# Patient Record
Sex: Male | Born: 1955 | Race: White | Hispanic: No | Marital: Single | Smoking: Never smoker
Health system: Southern US, Community
[De-identification: ages and names within clinical notes are randomized; demographics above are authoritative.]

## PROBLEM LIST (undated history)

## (undated) DIAGNOSIS — E119 Type 2 diabetes mellitus without complications: Secondary | ICD-10-CM

## (undated) DIAGNOSIS — I219 Acute myocardial infarction, unspecified: Secondary | ICD-10-CM

## (undated) DIAGNOSIS — I25810 Atherosclerosis of coronary artery bypass graft(s) without angina pectoris: Secondary | ICD-10-CM

## (undated) DIAGNOSIS — E7800 Pure hypercholesterolemia, unspecified: Secondary | ICD-10-CM

## (undated) DIAGNOSIS — I10 Essential (primary) hypertension: Secondary | ICD-10-CM

## (undated) DIAGNOSIS — N400 Enlarged prostate without lower urinary tract symptoms: Secondary | ICD-10-CM

## (undated) DIAGNOSIS — I4891 Unspecified atrial fibrillation: Secondary | ICD-10-CM

## (undated) DIAGNOSIS — I2510 Atherosclerotic heart disease of native coronary artery without angina pectoris: Secondary | ICD-10-CM

## (undated) DIAGNOSIS — K219 Gastro-esophageal reflux disease without esophagitis: Secondary | ICD-10-CM

## (undated) HISTORY — PX: MEDIAL COLLATERAL LIGAMENT REPAIR, KNEE: SHX2019

## (undated) HISTORY — PX: LUMBAR FUSION: SHX111

## (undated) HISTORY — PX: OTHER SURGICAL HISTORY: SHX169

## (undated) HISTORY — PX: TIBIAL FASCIOTOMY: SHX2520

---

## 2017-12-19 ENCOUNTER — Encounter (HOSPITAL_COMMUNITY): Payer: Self-pay | Admitting: Emergency Medicine

## 2017-12-19 ENCOUNTER — Other Ambulatory Visit: Payer: Self-pay

## 2017-12-19 ENCOUNTER — Emergency Department (HOSPITAL_COMMUNITY)

## 2017-12-19 ENCOUNTER — Inpatient Hospital Stay (HOSPITAL_COMMUNITY)
Admission: EM | Admit: 2017-12-19 | Discharge: 2017-12-22 | DRG: 287 | Attending: Internal Medicine | Admitting: Internal Medicine

## 2017-12-19 DIAGNOSIS — I10 Essential (primary) hypertension: Secondary | ICD-10-CM | POA: Diagnosis present

## 2017-12-19 DIAGNOSIS — N400 Enlarged prostate without lower urinary tract symptoms: Secondary | ICD-10-CM | POA: Diagnosis not present

## 2017-12-19 DIAGNOSIS — I25710 Atherosclerosis of autologous vein coronary artery bypass graft(s) with unstable angina pectoris: Secondary | ICD-10-CM | POA: Diagnosis present

## 2017-12-19 DIAGNOSIS — Z951 Presence of aortocoronary bypass graft: Secondary | ICD-10-CM | POA: Diagnosis not present

## 2017-12-19 DIAGNOSIS — I4891 Unspecified atrial fibrillation: Secondary | ICD-10-CM | POA: Diagnosis present

## 2017-12-19 DIAGNOSIS — N189 Chronic kidney disease, unspecified: Secondary | ICD-10-CM | POA: Diagnosis present

## 2017-12-19 DIAGNOSIS — Z888 Allergy status to other drugs, medicaments and biological substances status: Secondary | ICD-10-CM | POA: Diagnosis not present

## 2017-12-19 DIAGNOSIS — I739 Peripheral vascular disease, unspecified: Principal | ICD-10-CM | POA: Diagnosis present

## 2017-12-19 DIAGNOSIS — I4581 Long QT syndrome: Secondary | ICD-10-CM | POA: Diagnosis present

## 2017-12-19 DIAGNOSIS — I25810 Atherosclerosis of coronary artery bypass graft(s) without angina pectoris: Secondary | ICD-10-CM | POA: Diagnosis present

## 2017-12-19 DIAGNOSIS — E669 Obesity, unspecified: Secondary | ICD-10-CM | POA: Diagnosis not present

## 2017-12-19 DIAGNOSIS — E1122 Type 2 diabetes mellitus with diabetic chronic kidney disease: Secondary | ICD-10-CM | POA: Diagnosis present

## 2017-12-19 DIAGNOSIS — Z7901 Long term (current) use of anticoagulants: Secondary | ICD-10-CM

## 2017-12-19 DIAGNOSIS — R079 Chest pain, unspecified: Secondary | ICD-10-CM | POA: Diagnosis not present

## 2017-12-19 DIAGNOSIS — Z7982 Long term (current) use of aspirin: Secondary | ICD-10-CM | POA: Diagnosis not present

## 2017-12-19 DIAGNOSIS — I25700 Atherosclerosis of coronary artery bypass graft(s), unspecified, with unstable angina pectoris: Secondary | ICD-10-CM | POA: Diagnosis not present

## 2017-12-19 DIAGNOSIS — Z955 Presence of coronary angioplasty implant and graft: Secondary | ICD-10-CM | POA: Diagnosis not present

## 2017-12-19 DIAGNOSIS — I252 Old myocardial infarction: Secondary | ICD-10-CM

## 2017-12-19 DIAGNOSIS — I25720 Atherosclerosis of autologous artery coronary artery bypass graft(s) with unstable angina pectoris: Secondary | ICD-10-CM | POA: Diagnosis present

## 2017-12-19 DIAGNOSIS — E1169 Type 2 diabetes mellitus with other specified complication: Secondary | ICD-10-CM | POA: Diagnosis not present

## 2017-12-19 DIAGNOSIS — E7800 Pure hypercholesterolemia, unspecified: Secondary | ICD-10-CM | POA: Diagnosis present

## 2017-12-19 DIAGNOSIS — E785 Hyperlipidemia, unspecified: Secondary | ICD-10-CM | POA: Diagnosis not present

## 2017-12-19 DIAGNOSIS — I129 Hypertensive chronic kidney disease with stage 1 through stage 4 chronic kidney disease, or unspecified chronic kidney disease: Secondary | ICD-10-CM | POA: Diagnosis not present

## 2017-12-19 DIAGNOSIS — I25110 Atherosclerotic heart disease of native coronary artery with unstable angina pectoris: Secondary | ICD-10-CM | POA: Diagnosis present

## 2017-12-19 DIAGNOSIS — I445 Left posterior fascicular block: Secondary | ICD-10-CM | POA: Diagnosis present

## 2017-12-19 DIAGNOSIS — I200 Unstable angina: Secondary | ICD-10-CM | POA: Diagnosis not present

## 2017-12-19 DIAGNOSIS — Z8249 Family history of ischemic heart disease and other diseases of the circulatory system: Secondary | ICD-10-CM | POA: Diagnosis not present

## 2017-12-19 DIAGNOSIS — Z6839 Body mass index (BMI) 39.0-39.9, adult: Secondary | ICD-10-CM

## 2017-12-19 DIAGNOSIS — Z794 Long term (current) use of insulin: Secondary | ICD-10-CM | POA: Diagnosis not present

## 2017-12-19 DIAGNOSIS — R202 Paresthesia of skin: Secondary | ICD-10-CM | POA: Diagnosis not present

## 2017-12-19 DIAGNOSIS — I480 Paroxysmal atrial fibrillation: Secondary | ICD-10-CM | POA: Diagnosis not present

## 2017-12-19 DIAGNOSIS — I959 Hypotension, unspecified: Secondary | ICD-10-CM | POA: Diagnosis not present

## 2017-12-19 DIAGNOSIS — N179 Acute kidney failure, unspecified: Secondary | ICD-10-CM | POA: Diagnosis not present

## 2017-12-19 DIAGNOSIS — Z79899 Other long term (current) drug therapy: Secondary | ICD-10-CM

## 2017-12-19 DIAGNOSIS — K219 Gastro-esophageal reflux disease without esophagitis: Secondary | ICD-10-CM | POA: Diagnosis present

## 2017-12-19 HISTORY — DX: Benign prostatic hyperplasia without lower urinary tract symptoms: N40.0

## 2017-12-19 HISTORY — DX: Acute myocardial infarction, unspecified: I21.9

## 2017-12-19 HISTORY — DX: Essential (primary) hypertension: I10

## 2017-12-19 HISTORY — DX: Atherosclerosis of coronary artery bypass graft(s) without angina pectoris: I25.810

## 2017-12-19 HISTORY — DX: Unspecified atrial fibrillation: I48.91

## 2017-12-19 HISTORY — DX: Type 2 diabetes mellitus without complications: E11.9

## 2017-12-19 HISTORY — DX: Gastro-esophageal reflux disease without esophagitis: K21.9

## 2017-12-19 HISTORY — DX: Atherosclerotic heart disease of native coronary artery without angina pectoris: I25.10

## 2017-12-19 HISTORY — DX: Pure hypercholesterolemia, unspecified: E78.00

## 2017-12-19 LAB — BASIC METABOLIC PANEL
Anion gap: 8 (ref 5–15)
BUN: 28 mg/dL — AB (ref 8–23)
CALCIUM: 8.3 mg/dL — AB (ref 8.9–10.3)
CO2: 19 mmol/L — AB (ref 22–32)
CREATININE: 1.41 mg/dL — AB (ref 0.61–1.24)
Chloride: 111 mmol/L (ref 98–111)
GFR calc Af Amer: 60 mL/min — ABNORMAL LOW (ref >60)
GFR calc non Af Amer: 52 mL/min — ABNORMAL LOW (ref >60)
Glucose, Bld: 184 mg/dL — ABNORMAL HIGH (ref 70–99)
Potassium: 4.3 mmol/L (ref 3.5–5.1)
SODIUM: 138 mmol/L (ref 135–145)

## 2017-12-19 LAB — CBC
HEMATOCRIT: 34.8 % — AB (ref 39.0–52.0)
HEMOGLOBIN: 11.8 g/dL — AB (ref 13.0–17.0)
MCH: 31.5 pg (ref 26.0–34.0)
MCHC: 33.9 g/dL (ref 30.0–36.0)
MCV: 92.8 fL (ref 78.0–100.0)
Platelets: 228 10*3/uL (ref 150–400)
RBC: 3.75 MIL/uL — ABNORMAL LOW (ref 4.22–5.81)
RDW: 13.4 % (ref 11.5–15.5)
WBC: 9.1 10*3/uL (ref 4.0–10.5)

## 2017-12-19 LAB — GLUCOSE, CAPILLARY
GLUCOSE-CAPILLARY: 162 mg/dL — AB (ref 70–99)
Glucose-Capillary: 171 mg/dL — ABNORMAL HIGH (ref 70–99)

## 2017-12-19 LAB — HEPARIN LEVEL (UNFRACTIONATED): Heparin Unfractionated: >2.2 IU/mL — ABNORMAL HIGH (ref 0.30–0.70)

## 2017-12-19 LAB — I-STAT TROPONIN, ED: TROPONIN I, POC: 0 ng/mL (ref 0.00–0.08)

## 2017-12-19 LAB — APTT
aPTT: 27 seconds (ref 24–36)
aPTT: 77 seconds — ABNORMAL HIGH (ref 24–36)

## 2017-12-19 LAB — TROPONIN I

## 2017-12-19 LAB — PROTIME-INR
INR: 1.51
Prothrombin Time: 18 seconds — ABNORMAL HIGH (ref 11.4–15.2)

## 2017-12-19 LAB — MRSA PCR SCREENING: MRSA BY PCR: NEGATIVE

## 2017-12-19 MED ORDER — RANOLAZINE ER 500 MG PO TB12
500.0000 mg | ORAL_TABLET | Freq: Two times a day (BID) | ORAL | Status: DC
  Administered 2017-12-19 – 2017-12-22 (×7): 500 mg via ORAL
  Filled 2017-12-19 (×7): qty 1

## 2017-12-19 MED ORDER — PANTOPRAZOLE SODIUM 40 MG PO TBEC
40.0000 mg | DELAYED_RELEASE_TABLET | Freq: Every day | ORAL | Status: DC
  Administered 2017-12-19 – 2017-12-21 (×3): 40 mg via ORAL
  Filled 2017-12-19 (×3): qty 1

## 2017-12-19 MED ORDER — FINASTERIDE 5 MG PO TABS
5.0000 mg | ORAL_TABLET | Freq: Every day | ORAL | Status: DC
  Administered 2017-12-19 – 2017-12-22 (×4): 5 mg via ORAL
  Filled 2017-12-19 (×4): qty 1

## 2017-12-19 MED ORDER — ATORVASTATIN CALCIUM 80 MG PO TABS
80.0000 mg | ORAL_TABLET | Freq: Every day | ORAL | Status: DC
  Administered 2017-12-19 – 2017-12-21 (×3): 80 mg via ORAL
  Filled 2017-12-19 (×3): qty 1

## 2017-12-19 MED ORDER — TAMSULOSIN HCL 0.4 MG PO CAPS
0.4000 mg | ORAL_CAPSULE | Freq: Every day | ORAL | Status: DC
  Administered 2017-12-19 – 2017-12-22 (×4): 0.4 mg via ORAL
  Filled 2017-12-19 (×4): qty 1

## 2017-12-19 MED ORDER — ASPIRIN EC 81 MG PO TBEC
81.0000 mg | DELAYED_RELEASE_TABLET | Freq: Every day | ORAL | Status: DC
  Administered 2017-12-20 – 2017-12-22 (×2): 81 mg via ORAL
  Filled 2017-12-19 (×3): qty 1

## 2017-12-19 MED ORDER — ACETAMINOPHEN 325 MG PO TABS: 650.0000 mg | ORAL_TABLET | ORAL | Status: DC | PRN

## 2017-12-19 MED ORDER — INSULIN ASPART 100 UNIT/ML ~~LOC~~ SOLN: 0.0000 [IU] | Freq: Every day | SUBCUTANEOUS | Status: DC

## 2017-12-19 MED ORDER — HEPARIN BOLUS VIA INFUSION
4000.0000 [IU] | Freq: Once | INTRAVENOUS | Status: AC
  Administered 2017-12-19: 4000 [IU] via INTRAVENOUS

## 2017-12-19 MED ORDER — DOCUSATE SODIUM 100 MG PO CAPS
100.0000 mg | ORAL_CAPSULE | Freq: Two times a day (BID) | ORAL | Status: DC
  Administered 2017-12-19 – 2017-12-22 (×5): 100 mg via ORAL
  Filled 2017-12-19 (×6): qty 1

## 2017-12-19 MED ORDER — ASPIRIN 81 MG PO CHEW
324.0000 mg | CHEWABLE_TABLET | Freq: Once | ORAL | Status: AC
  Administered 2017-12-19: 324 mg via ORAL
  Filled 2017-12-19: qty 4

## 2017-12-19 MED ORDER — MORPHINE SULFATE (PF) 4 MG/ML IV SOLN
4.0000 mg | Freq: Once | INTRAVENOUS | Status: AC
  Administered 2017-12-19: 4 mg via INTRAVENOUS
  Filled 2017-12-19: qty 1

## 2017-12-19 MED ORDER — NITROGLYCERIN IN D5W 200-5 MCG/ML-% IV SOLN
0.0000 ug/min | INTRAVENOUS | Status: DC
  Administered 2017-12-19: 5 ug/min via INTRAVENOUS
  Filled 2017-12-19 (×2): qty 250

## 2017-12-19 MED ORDER — INSULIN GLARGINE 100 UNIT/ML ~~LOC~~ SOLN
21.0000 [IU] | Freq: Every day | SUBCUTANEOUS | Status: DC
  Administered 2017-12-19 – 2017-12-21 (×3): 21 [IU] via SUBCUTANEOUS
  Filled 2017-12-19 (×4): qty 0.21

## 2017-12-19 MED ORDER — MORPHINE SULFATE (PF) 2 MG/ML IV SOLN
2.0000 mg | INTRAVENOUS | Status: DC | PRN
  Administered 2017-12-19: 2 mg via INTRAVENOUS
  Filled 2017-12-19: qty 1

## 2017-12-19 MED ORDER — HEPARIN (PORCINE) IN NACL 100-0.45 UNIT/ML-% IJ SOLN
1500.0000 [IU]/h | INTRAMUSCULAR | Status: DC
  Administered 2017-12-19 – 2017-12-20 (×2): 1200 [IU]/h via INTRAVENOUS
  Filled 2017-12-19 (×3): qty 250

## 2017-12-19 MED ORDER — INSULIN ASPART 100 UNIT/ML ~~LOC~~ SOLN
0.0000 [IU] | Freq: Three times a day (TID) | SUBCUTANEOUS | Status: DC
  Administered 2017-12-19 – 2017-12-20 (×2): 5 [IU] via SUBCUTANEOUS
  Administered 2017-12-20 (×2): 3 [IU] via SUBCUTANEOUS
  Administered 2017-12-21: 5 [IU] via SUBCUTANEOUS
  Administered 2017-12-22: 3 [IU] via SUBCUTANEOUS
  Administered 2017-12-22: 2 [IU] via SUBCUTANEOUS

## 2017-12-19 MED ORDER — SODIUM CHLORIDE 0.9 % IV SOLN
INTRAVENOUS | Status: DC
  Administered 2017-12-19 – 2017-12-22 (×4): via INTRAVENOUS

## 2017-12-19 MED ORDER — CARVEDILOL 3.125 MG PO TABS
3.1250 mg | ORAL_TABLET | Freq: Two times a day (BID) | ORAL | Status: DC
  Administered 2017-12-19 – 2017-12-21 (×4): 3.125 mg via ORAL
  Filled 2017-12-19 (×4): qty 1

## 2017-12-19 MED ORDER — ONDANSETRON HCL 4 MG/2ML IJ SOLN
4.0000 mg | Freq: Four times a day (QID) | INTRAMUSCULAR | Status: DC | PRN
  Administered 2017-12-19: 4 mg via INTRAVENOUS
  Filled 2017-12-19: qty 2

## 2017-12-19 NOTE — ED Triage Notes (Signed)
Pt reports having chest pain all week with response to nitro.  This morning around 0620 began having pain in center of chest radiating to left shoulder and left neck.  [facility] with no relief and another 2 by ems.  Also given Fentanyl  100mcg by ems with no change.

## 2017-12-19 NOTE — ED Notes (Signed)
[facility] present to transport patient. 

## 2017-12-19 NOTE — H&P (Signed)
History and Physical    Kevin Ballard ZOX:[id number] DOB: 17-Dec-1955 DOA: 12/19/2017  PCP: Patient, No Pcp Per  [facility] coming from: correctional facility  I have personally briefly reviewed patient's old medical records in [facility]  Chief Complaint: chest pain  HPI: Kevin Ballard is a 62 y.o. male with medical history significant of HTN, DM, CAD s/p CABG approximately 3 years ago with stent placement 2 years ago. Patient reports intermittent chest pain on exertion for the past week that resolves with nitroglycerin. This morning while he was walking, he had recurrence of chest pain, substernal that is described as a heaviness, radiating to left shoulder and jaw. He had tingling in left hand. Had shortness of breath, diaphoresis and nausea. He vomited once. He took 3 nitro without relief. He was transported to ED for evaluation   ED Course: EKG and troponin in ED were unrevealing. Blood pressure noted to be on the lower side. Chest xray did not show any acute findings. Due to his cardiac history and typical anginal symptoms, he was started on nitroglycerin and heparin infusions. He has been referred for admission.  Review of Systems: As per HPI otherwise 10 point review of systems negative.    Past Medical History:  Diagnosis Date  . Atrial fibrillation (HCC)   . Coronary artery disease   . Coronary artery disease involving coronary bypass graft   . Diabetes mellitus without complication (HCC)   . Hypertension   . Myocardial infarct [facility])     Past Surgical History:  Procedure Laterality Date  . MEDIAL COLLATERAL LIGAMENT REPAIR, KNEE    . Kimber Relic       reports that he has never smoked. He does not have any smokeless tobacco history on file. He reports that he does not drink alcohol or use drugs.  Allergies  Allergen Reactions  . Brilinta [Ticagrelor]     Family history: Mother has a history of coronary disease  Prior to Admission medications   Medication Sig Start  Date End Date Taking? Authorizing Provider  AMLODIPINE BESYLATE PO Take 10 mg by mouth daily.   Yes [provider]  apixaban (ELIQUIS) 5 MG TABS tablet Take 5 mg by mouth 2 (two) times daily.   Yes [provider]  aspirin EC 81 MG tablet Take 81 mg by mouth daily.   Yes [provider]  atorvastatin (LIPITOR) 80 MG tablet Take 80 mg by mouth 2 (two) times daily.   Yes [provider]  carvedilol (COREG) 25 MG tablet Take 25 mg by mouth 2 (two) times daily with a meal.   Yes [provider]  docusate sodium (COLACE) 100 MG capsule Take 100 mg by mouth 2 (two) times daily.   Yes [provider]  enalapril (VASOTEC) 10 MG tablet Take 10 mg by mouth 2 (two) times daily.   Yes [provider]  finasteride (PROSCAR) 5 MG tablet Take 5 mg by mouth daily.   Yes [provider]  insulin glargine (LANTUS) 100 UNIT/ML injection Inject 21 Units into the skin at bedtime.   Yes [provider]  isosorbide mononitrate (IMDUR) 60 MG 24 hr tablet Take 60 mg by mouth daily.   Yes [provider]  metFORMIN (GLUCOPHAGE) 1000 MG tablet Take 1,000 mg by mouth daily.   Yes [provider]  nitroGLYCERIN (NITROSTAT) 0.3 MG SL tablet Place 0.3 mg under the tongue every 5 (five) minutes as needed for chest pain.   Yes [provider]  omeprazole (PRILOSEC) 20 MG capsule Take 20 mg by mouth daily.   Yes [provider]  ranolazine (RANEXA) 500 MG 12 hr tablet Take 500 mg by mouth 2 (two) times daily.   Yes [provider]  tamsulosin (FLOMAX) 0.4 MG CAPS capsule Take 0.4 mg by mouth daily.   Yes [provider]    Physical Exam: Vitals:   12/19/17 0738 12/19/17 0739 12/19/17 0740 12/19/17 1100  BP:  (!) 101/45 (!) 104/50 (!) 110/58  Pulse: 69 68 70 69  Resp: 11 (!) 21 18 20   Temp:   (!) 97.5 F (36.4 C)   TempSrc:   Oral   SpO2: 99% 99% 98% 98%  Weight:      Height:         Constitutional: NAD, calm, comfortable Vitals:   12/19/17 0738 12/19/17 0739 12/19/17 0740 12/19/17 1100  BP:  (!) 101/45 (!) 104/50 (!) 110/58  Pulse: 69 68 70 69  Resp: 11 (!) 21 18 20   Temp:   (!) 97.5 F (36.4 C)   TempSrc:   Oral   SpO2: 99% 99% 98% 98%  Weight:      Height:       Eyes: PERRL, lids and conjunctivae normal ENMT: Mucous membranes are moist. Posterior pharynx clear of any exudate or lesions.Normal dentition.  Neck: normal, supple, no masses, no thyromegaly Respiratory: clear to auscultation bilaterally, no wheezing, no crackles. Normal respiratory effort. No accessory muscle use.  Cardiovascular: Regular rate and rhythm, no murmurs / rubs / gallops. 1+ extremity edema. 2+ pedal pulses. No carotid bruits.  Abdomen: no tenderness, no masses palpated. No hepatosplenomegaly. Bowel sounds positive.  Musculoskeletal: no clubbing / cyanosis. No joint deformity upper and lower extremities. Good ROM, no contractures. Normal muscle tone.  Skin: no rashes, lesions, ulcers. No induration Neurologic: CN 2-12 grossly intact. Sensation intact, DTR normal. Strength 5/5 in all 4.  Psychiatric: Normal judgment and insight. Alert and oriented x 3. Normal mood.    Labs on Admission: I have personally reviewed following labs and imaging studies  CBC: Recent Labs  Lab 12/19/17 0825  WBC 9.1  HGB 11.8*  HCT 34.8*  MCV 92.8  PLT 228   Basic Metabolic Panel: Recent Labs  Lab 12/19/17 0825  NA 138  K 4.3  CL 111  CO2 19*  GLUCOSE 184*  BUN 28*  CREATININE 1.41*  CALCIUM 8.3*   GFR: Estimated Creatinine Clearance: 68.2 mL/min (A) (by C-G formula based on SCr of 1.41 mg/dL (H)). Liver Function Tests: No results for input(s): AST, ALT, ALKPHOS, BILITOT, PROT, ALBUMIN in the last 168 hours. No results for input(s): LIPASE, AMYLASE in the last 168 hours. No results for input(s): AMMONIA in the last 168 hours. Coagulation Profile: Recent Labs  Lab 12/19/17 0825   INR 1.51   Cardiac Enzymes: No results for input(s): CKTOTAL, CKMB, CKMBINDEX, TROPONINI in the last 168 hours. BNP (last 3 results) No results for input(s): PROBNP in the last 8760 hours. HbA1C: No results for input(s): HGBA1C in the last 72 hours. CBG: No results for input(s): GLUCAP in the last 168 hours. Lipid Profile: No results for input(s): CHOL, HDL, LDLCALC, TRIG, CHOLHDL, LDLDIRECT in the last 72 hours. Thyroid Function Tests: No results for input(s): TSH, T4TOTAL, FREET4, T3FREE, THYROIDAB in the last 72 hours. Anemia Panel: No results for input(s): VITAMINB12, FOLATE, FERRITIN, TIBC, IRON, RETICCTPCT in the last 72 hours. Urine analysis: No results found for: COLORURINE, APPEARANCEUR, LABSPEC, PHURINE, GLUCOSEU, HGBUR, BILIRUBINUR,  Vance GatherKETONESUR, PROTEINUR, UROBILINOGEN, NITRITE, LEUKOCYTESUR  Radiological Exams on Admission: Dg Chest Portable 1 View  Result Date: 12/19/2017 CLINICAL DATA:  Chest pain for 1 week. EXAM: PORTABLE CHEST 1 VIEW COMPARISON:  None. FINDINGS: The patient is status post CABG. Heart size is upper normal. The lungs are clear. No pneumothorax or pleural effusion. No acute bony abnormality. IMPRESSION: No acute disease. Electronically Signed   By: Kevin Kannerhomas  Dalessio M.D.   On: 12/19/2017 08:28    EKG: Independently reviewed. Sinus rhythm without acute changes  Assessment/Plan Principal Problem:   Chest pain Active Problems:   CAD (coronary artery disease) of bypass graft   Type 2 diabetes mellitus with other specified complication (HCC)   Unspecified atrial fibrillation (HCC)   Chronic anticoagulation   HTN (hypertension)   HLD (hyperlipidemia)   BPH (benign prostatic hyperplasia)     1. Chest pain. Symptoms are typical for angina. EKG and troponin are unrevealing. He is already on aspirin. Blood pressures are soft, so will hold BB, norvasc and ACE. Continue ranexa. He has been started on nitroglycerin infusion as well as heparin infusion. He is  already on statin. Continue to cycle troponins. Discuss[facility] with Kevin Ball[facility] 2. CAD s/p CABG approximately 3 years ago. [place] stent was placed 2 years ago at same facility. No records in care everywhere. Continue on aspirin 3. Paroxysmal A fib. Currently in sinus rhythm. He is on coreg 25mg  bid. Holding BB due to soft blood pressures. Chronically on eliquis, but this has been held in place of IV heparin 4. Diabetes. Continue on lantus. Will supplement with sliding scale. Hold metformin 5. HLD. Continue statin 6. BPH. Continue finasteride and flomax  DVT prophylaxis: heparin infusion  Code Status: full code  Family Communication: no family present  Disposition Plan: [facility]  Consults called: cardiology, Kevin Ballard  Admission status: observation, stepdown   Kevin BlinksJehanzeb Bryler Dibble MD [facility] Pager [phone]  If 7PM-7AM, please contact night-coverage [email or web address] Password [email or web address]  12/19/2017, 11:23 AM

## 2017-12-19 NOTE — Progress Notes (Signed)
ANTICOAGULATION CONSULT NOTE  Pharmacy Consult for heparin Indication: ACS/STEMI  Allergies  Allergen Reactions  . Brilinta [Ticagrelor]     Patient Measurements: Height: 5\' 9"  (175.3 cm) Weight: 264 lb 14.4 oz (120.2 kg) IBW/kg (Calculated) : 70.7 Heparin Dosing Weight: 97.5  Vital Signs: Temp: 98 F (36.7 C) (07/06 1954) Temp Source: Oral (07/06 1954) BP: 108/55 (07/06 1954) Pulse Rate: 68 (07/06 1954)  Labs: Recent Labs    12/19/17 0825 12/19/17 1129 12/19/17 1442 12/19/17 1850  HGB 11.8*  --   --   --   HCT 34.8*  --   --   --   PLT 228  --   --   --   APTT  --  27  --  77*  LABPROT 18.0*  --   --   --   INR 1.51  --   --   --   HEPARINUNFRC  --  >2.20*  --  >2.20*  CREATININE 1.41*  --   --   --   TROPONINI  --   --  <0.03  --     Estimated Creatinine Clearance: 68.6 mL/min (A) (by C-G formula based on SCr of 1.41 mg/dL (H)).   Medical History: Past Medical History:  Diagnosis Date  . Atrial fibrillation (HCC)   . BPH (benign prostatic hyperplasia)   . Coronary artery disease   . Coronary artery disease involving coronary bypass graft   . Diabetes mellitus without complication (HCC)   . GERD (gastroesophageal reflux disease)   . Hypercholesterolemia   . Hypertension   . Myocardial infarct [facility](HCC)     Medications:  Medications Prior to Admission  Medication Sig Dispense Refill Last Dose  . AMLODIPINE BESYLATE PO Take 10 mg by mouth daily.   12/18/2017 at Unknown time  . apixaban (ELIQUIS) 5 MG TABS tablet Take 5 mg by mouth 2 (two) times daily.   12/18/2017 at Unknown time  . aspirin EC 81 MG tablet Take 81 mg by mouth daily.   12/18/2017 at Unknown time  . atorvastatin (LIPITOR) 80 MG tablet Take 80 mg by mouth daily at 6 PM.    12/18/2017 at Unknown time  . carvedilol (COREG) 25 MG tablet Take 25 mg by mouth 2 (two) times daily with a meal.   12/18/2017 at Unknown time  . docusate sodium (COLACE) 100 MG capsule Take 100 mg by mouth 2 (two) times daily.    12/18/2017 at Unknown time  . enalapril (VASOTEC) 10 MG tablet Take 10 mg by mouth 2 (two) times daily.   12/18/2017 at Unknown time  . finasteride (PROSCAR) 5 MG tablet Take 5 mg by mouth daily.   12/18/2017 at Unknown time  . insulin glargine (LANTUS) 100 UNIT/ML injection Inject 21 Units into the skin at bedtime.   12/18/2017 at Unknown time  . isosorbide mononitrate (IMDUR) 60 MG 24 hr tablet Take 60 mg by mouth daily.   12/18/2017 at Unknown time  . metFORMIN (GLUCOPHAGE) 1000 MG tablet Take 1,000 mg by mouth daily.   12/18/2017 at Unknown time  . nitroGLYCERIN (NITROSTAT) 0.3 MG SL tablet Place 0.3 mg under the tongue every 5 (five) minutes as needed for chest pain.   unknown  . omeprazole (PRILOSEC) 20 MG capsule Take 20 mg by mouth daily.   12/18/2017 at Unknown time  . ranolazine (RANEXA) 500 MG 12 hr tablet Take 500 mg by mouth 2 (two) times daily.   12/18/2017 at Unknown time  . tamsulosin (FLOMAX) 0.4  MG CAPS capsule Take 0.4 mg by mouth daily.   12/18/2017 at Unknown time    Assessment: Pharmacy consulted to dose heparin on patient with unstable angina. Patient on apixaban prior to admission with last dose being given 7/5. Plans noted for cath on Monday -aPTT = 77, heparin level > 2.2 (influence of recent xarelto)  Goal of Therapy:  Target aPTT 66-102 s Heparin level 0.3-0.7 units/ml Monitor platelets by anticoagulation protocol: Yes   Plan:   -No heparin changes needed -Daily heparin level and CBC  Harland German, PharmD Clinical Pharmacist Please check Amion for pharmacy contact number

## 2017-12-19 NOTE — ED Notes (Signed)
Report given to [facility]. 

## 2017-12-19 NOTE — Plan of Care (Signed)
  Problem: Nutrition: Goal: Adequate nutrition will be maintained Outcome: Progressing   Problem: Education: Goal: Knowledge of General Education information will improve Outcome: Completed/Met

## 2017-12-19 NOTE — ED Provider Notes (Signed)
[facility] EMERGENCY DEPARTMENT Provider Note   CSN: [id number] Arrival date & time: 12/19/17  [id number]     History   Chief Complaint Chief Complaint  Patient presents with  . Chest Pain    HPI Kevin Ballard is a 62 y.o. male.  HPI Patient presents to the emergency room for evaluation of chest pain.  Patient has a history of coronary artery disease as well as myocardial infarctions.  States he has been having trouble with chest pain over the past week.  He was taking nitroglycerin which relieved the symptoms after few minutes.  This morning he started having chest pain again that started about 6 AM.  He tried taking his nitroglycerin without relief.  He is having a severe pressure in his chest that radiates to his arm and shoulder.  He has nausea and shortness of breath and felt diaphoretic.  The symptoms feel similar to his prior anginal symptoms.  Patient is currently incarcerated so he has not been able to see his cardiologist.  He was brought to the emergency room for evaluation this morning. Past Medical History:  Diagnosis Date  . Atrial fibrillation (HCC)   . Coronary artery disease   . Coronary artery disease involving coronary bypass graft   . Diabetes mellitus without complication (HCC)   . Hypertension   . Myocardial infarct (HCC)     There are no active problems to display for this patient.   Past Surgical History:  Procedure Laterality Date  . MEDIAL COLLATERAL LIGAMENT REPAIR, KNEE    . tibiastomy          Home Medications    Prior to Admission medications   Not on File    Family History History reviewed. No pertinent family history.  Social History Social History   Tobacco Use  . Smoking status: Never Smoker  Substance Use Topics  . Alcohol use: Never    Frequency: Never  . Drug use: Never     Allergies   Brilinta [ticagrelor]   Review of Systems Review of Systems  All other systems reviewed and are negative.    Physical  Exam Updated Vital Signs BP (!) 104/50 (BP Location: Left Arm)   Pulse 70   Temp (!) 97.5 F (36.4 C) (Oral)   Resp 18   Ht 1.753 m (5\' 9" )   Wt 118.8 kg (262 lb)   SpO2 98%   BMI 38.69 kg/m   Physical Exam  Constitutional: He appears well-developed and well-nourished. No distress.  HENT:  Head: Normocephalic and atraumatic.  Right Ear: External ear normal.  Left Ear: External ear normal.  Eyes: Conjunctivae are normal. Right eye exhibits no discharge. Left eye exhibits no discharge. No scleral icterus.  Neck: Neck supple. No tracheal deviation present.  Cardiovascular: Normal rate, regular rhythm and intact distal pulses.  Pulmonary/Chest: Effort normal and breath sounds normal. No stridor. No respiratory distress. He has no wheezes. He has no rales.  Abdominal: Soft. Bowel sounds are normal. He exhibits no distension. There is no tenderness. There is no rebound and no guarding.  Musculoskeletal: He exhibits no edema or tenderness.  Neurological: He is alert. He has normal strength. No cranial nerve deficit (no facial droop, extraocular movements intact, no slurred speech) or sensory deficit. He exhibits normal muscle tone. He displays no seizure activity. Coordination normal.  Skin: Skin is warm and dry. No rash noted.  Psychiatric: He has a normal mood and affect.  Nursing note and vitals reviewed.  ED Treatments / Results  Labs (all labs ordered are listed, but only abnormal results are displayed) Labs Reviewed  BASIC METABOLIC PANEL - Abnormal; Notable for the following components:      Result Value   CO2 19 (*)    Glucose, Bld 184 (*)    BUN 28 (*)    Creatinine, Ser 1.41 (*)    Calcium 8.3 (*)    GFR calc non Af Amer 52 (*)    GFR calc Af Amer 60 (*)    All other components within normal limits  CBC - Abnormal; Notable for the following components:   RBC 3.75 (*)    Hemoglobin 11.8 (*)    HCT 34.8 (*)    All other components within normal limits   PROTIME-INR - Abnormal; Notable for the following components:   Prothrombin Time 18.0 (*)    All other components within normal limits  I-STAT TROPONIN, ED    EKG EKG Interpretation  Date/Time:  Saturday December 19 2017 07:33:45 EDT Ventricular Rate:  71 PR Interval:    QRS Duration: 93 QT Interval:  445 QTC Calculation: 484 R Axis:   133 Text Interpretation:  Sinus rhythm Borderline prolonged PR interval Left posterior fascicular block Borderline prolonged QT interval No old tracing to compare Confirmed by Linwood Dibbles [phone]) on 12/19/2017 7:47:21 AM   Radiology Dg Chest Portable 1 View  Result Date: 12/19/2017 CLINICAL DATA:  Chest pain for 1 week. EXAM: PORTABLE CHEST 1 VIEW COMPARISON:  None. FINDINGS: The patient is status post CABG. Heart size is upper normal. The lungs are clear. No pneumothorax or pleural effusion. No acute bony abnormality. IMPRESSION: No acute disease. Electronically Signed   By: Drusilla Kanner M.D.   On: 12/19/2017 08:28    Procedures .Critical Care Performed by: Linwood Dibbles, MD Authorized by: Linwood Dibbles, MD   Critical care provider statement:    Critical care time (minutes):  35   Critical care was time spent personally by me on the following activities:  Discussions with consultants, evaluation of patient's response to treatment, examination of patient, ordering and performing treatments and interventions, ordering and review of laboratory studies, ordering and review of radiographic studies, pulse oximetry, re-evaluation of patient's condition, obtaining history from patient or surrogate and review of old charts   (including critical care time)  Medications Ordered in ED Medications  0.9 %  sodium chloride infusion ( Intravenous New Bag/Given 12/19/17 0901)  nitroGLYCERIN 50 mg in dextrose 5 % 250 mL (0.2 mg/mL) infusion (15 mcg/min Intravenous Rate/Dose Change 12/19/17 1015)  aspirin chewable tablet 324 mg (324 mg Oral Given 12/19/17 0831)  morphine 4  MG/ML injection 4 mg (4 mg Intravenous Given 12/19/17 0831)     Initial Impression / Assessment and Plan / ED Course  I have reviewed the triage vital signs and the nursing notes.  Pertinent labs & imaging results that were available during my care of the patient were reviewed by me and considered in my medical decision making (see chart for details).  Clinical Course as of Dec 20 1023  Sat Dec 19, 2017  0955 First trop =  0.   [JK]  1001 CXR normal.  Initial labs notable for chronic renal insufficiency.  No old for comparison.     [JK]  1009 Chest pain is better although he feels like it is coming back again.  Will increase ntg.  Plan on admission.   [JK]  1020 No old records available in care everywhere.     [  JK]  1024 D/w Dr Kerry Hough.  Will plan on admission to [facility], stepdown.  Hep infusion ordered by me.   Will repeat EKG   [JK]    Clinical Course User Index [JK] Linwood Dibbles, MD    Patient presented to the emergency room for evaluation of chest pain.  Patient has a history of coronary artery disease.  Patient's presenting with symptoms that he feels is similar to his prior anginal attacks.  Patient was treated with nitroglycerin as well as morphine with improvement of his symptoms will not completely resolved.  No acute ischemic changes noted on EKG.  His initial troponin is normal.  Patient is certainly at high risk for recurrent unstable angina and acute coronary syndrome.  I will consult the medical service for admission so he can continue to monitor his pain and check his troponins.  Final Clinical Impressions(s) / ED Diagnoses   Final diagnoses:  Chest pain, unspecified type       Linwood Dibbles, MD 12/19/17 1025

## 2017-12-19 NOTE — Progress Notes (Signed)
ANTICOAGULATION CONSULT NOTE - Initial Consult  Pharmacy Consult for heparin Indication: ACS/STEMI  Allergies  Allergen Reactions  . Brilinta [Ticagrelor]     Patient Measurements: Height: 5\' 9"  (175.3 cm) Weight: 262 lb (118.8 kg) IBW/kg (Calculated) : 70.7 Heparin Dosing Weight: 97.5  Vital Signs: Temp: 97.5 F (36.4 C) (07/06 0740) Temp Source: Oral (07/06 0740) BP: 104/50 (07/06 0740) Pulse Rate: 70 (07/06 0740)  Labs: Recent Labs    12/19/17 0825  HGB 11.8*  HCT 34.8*  PLT 228  LABPROT 18.0*  INR 1.51  CREATININE 1.41*    Estimated Creatinine Clearance: 68.2 mL/min (A) (by C-G formula based on SCr of 1.41 mg/dL (H)).   Medical History: Past Medical History:  Diagnosis Date  . Atrial fibrillation (HCC)   . Coronary artery disease   . Coronary artery disease involving coronary bypass graft   . Diabetes mellitus without complication (HCC)   . Hypertension   . Myocardial infarct [facility](HCC)     Medications:   (Not in a hospital admission)  Assessment: Pharmacy consulted to dose heparin on patient with ACS/STEMI. Patient on apixaban prior to admission with last dose being given 7/5. Will order aPTT and heparin level prior to administration of heparin. Will titrate heparin based on aPTT until apixaban effects of diminished.  Goal of Therapy:  Target aPTT 66-102 s Heparin level 0.3-0.7 units/ml Monitor platelets by anticoagulation protocol: Yes   Plan:  Give 4000 units bolus x 1 Start heparin infusion at 1200 units/hr Check anti-Xa level in 6 hours and daily while on heparin Continue to monitor H&H and platelets  Salvatore DecentSteven C Niurka Benecke 12/19/2017,10:37 AM

## 2017-12-19 NOTE — Consult Note (Signed)
Cardiology Consultation:   Patient ID: Kevin Ballard; [id number]; 1955/09/05   Admit date: 12/19/2017 Date of Consult: 12/19/2017  Primary Care Provider: Patient, No Pcp Per Primary Cardiologist: No primary care provider on file. New Primary Electrophysiologist:  none   Patient Profile:   Kevin Ballard is a 62 y.o. male with a hx of CAD s/p CABG and multiple prior stents  who is being seen today for the evaluation of increasing chest pain at the request of Dr. Kerry Hough.  History of Present Illness:   Kevin Ballard is currently in correctional facility. He has a history of IDDM, HTN, HLD. He has a long history of CAD with initial stents of LAD and diagonal in 2001. Subsequent stenting of the distal RCA in 2003. In 2016 he presented with an MI and underwent CABG x 3 at [facility] in [place]. 8 months later he had early graft failure and underwent stenting of SVG to diagonal with a 2.5 x 15 mm stent. He reports that he had chest pain last December and underwent cardiac cath in [place]. He was told his grafts were patent but there was some lesion that wasn't felt to be significant.  Over the past week he has noted increasing symptoms of chest pain with exertion. Typically relieved with sl Ntg x 1. Today he had recurrent chest pain 8/10 much worse than before and presented to ED. Pain did not resolve with sl Ntg. Did improve with IV Ntg and morphine. Still having 2/10 chest pain. Pain is mid sternal radiating to left arm. Associated with weakness, N/V x 1 and SOB. Similar to prior cardiac pain but worse. He does have a history of AFib about a year ago x 1 and is on Eliquis. Last dose last night.   Past Medical History:  Diagnosis Date  . Atrial fibrillation (HCC)   . BPH (benign prostatic hyperplasia)   . Coronary artery disease   . Coronary artery disease involving coronary bypass graft   . Diabetes mellitus without complication (HCC)   . GERD (gastroesophageal reflux disease)   .  Hypercholesterolemia   . Hypertension   . Myocardial infarct [facility])     Past Surgical History:  Procedure Laterality Date  . LUMBAR FUSION    . MEDIAL COLLATERAL LIGAMENT REPAIR, KNEE    . TIBIAL FASCIOTOMY    . tibiastomy       Home Medications:  Prior to Admission medications   Medication Sig Start Date End Date Taking? Authorizing Provider  AMLODIPINE BESYLATE PO Take 10 mg by mouth daily.   Yes [provider]  apixaban (ELIQUIS) 5 MG TABS tablet Take 5 mg by mouth 2 (two) times daily.   Yes [provider]  aspirin EC 81 MG tablet Take 81 mg by mouth daily.   Yes [provider]  atorvastatin (LIPITOR) 80 MG tablet Take 80 mg by mouth daily at 6 PM.    Yes [provider]  carvedilol (COREG) 25 MG tablet Take 25 mg by mouth 2 (two) times daily with a meal.   Yes [provider]  docusate sodium (COLACE) 100 MG capsule Take 100 mg by mouth 2 (two) times daily.   Yes [provider]  enalapril (VASOTEC) 10 MG tablet Take 10 mg by mouth 2 (two) times daily.   Yes [provider]  finasteride (PROSCAR) 5 MG tablet Take 5 mg by mouth daily.   Yes [provider]  insulin glargine (LANTUS) 100 UNIT/ML injection Inject  21 Units into the skin at bedtime.   Yes [provider]  isosorbide mononitrate (IMDUR) 60 MG 24 hr tablet Take 60 mg by mouth daily.   Yes [provider]  metFORMIN (GLUCOPHAGE) 1000 MG tablet Take 1,000 mg by mouth daily.   Yes [provider]  nitroGLYCERIN (NITROSTAT) 0.3 MG SL tablet Place 0.3 mg under the tongue every 5 (five) minutes as needed for chest pain.   Yes [provider]  omeprazole (PRILOSEC) 20 MG capsule Take 20 mg by mouth daily.   Yes [provider]  ranolazine (RANEXA) 500 MG 12 hr tablet Take 500 mg by mouth 2 (two) times daily.   Yes [provider]  tamsulosin (FLOMAX) 0.4 MG CAPS capsule Take 0.4 mg by mouth daily.   Yes  [provider]    Inpatient Medications: Scheduled Meds: . aspirin EC  81 mg Oral Daily  . atorvastatin  80 mg Oral q1800  . docusate sodium  100 mg Oral BID  . finasteride  5 mg Oral Daily  . insulin aspart  0-15 Units Subcutaneous TID WC  . insulin aspart  0-5 Units Subcutaneous QHS  . insulin glargine  21 Units Subcutaneous QHS  . pantoprazole  40 mg Oral Daily  . ranolazine  500 mg Oral BID  . tamsulosin  0.4 mg Oral Daily   Continuous Infusions: . sodium chloride 125 mL/hr at 12/19/17 1524  . heparin 1,200 Units/hr (12/19/17 1400)  . nitroGLYCERIN 15 mcg/min (12/19/17 1400)   PRN Meds: acetaminophen, morphine injection, ondansetron (ZOFRAN) IV  Allergies:    Allergies  Allergen Reactions  . Brilinta [Ticagrelor]     Social History:   Social History   Socioeconomic History  . Marital status: Single    Spouse name: Not on file  . Number of children: Not on file  . Years of education: Not on file  . Highest education level: Not on file  Occupational History  . Not on file  Social Needs  . Financial resource strain: Not on file  . Food insecurity:    Worry: Not on file    Inability: Not on file  . Transportation needs:    Medical: Not on file    Non-medical: Not on file  Tobacco Use  . Smoking status: Never Smoker  . Smokeless tobacco: Never Used  Substance and Sexual Activity  . Alcohol use: Never    Frequency: Never  . Drug use: Never  . Sexual activity: Not on file  Lifestyle  . Physical activity:    Days per week: Not on file    Minutes per session: Not on file  . Stress: Not on file  Relationships  . Social connections:    Talks on phone: Not on file    Gets together: Not on file    Attends religious service: Not on file    Active member of club or organization: Not on file    Attends meetings of clubs or organizations: Not on file    Relationship status: Not on file  . Intimate partner violence:    Fear of current or ex partner:  Not on file    Emotionally abused: Not on file    Physically abused: Not on file    Forced sexual activity: Not on file  Other Topics Concern  . Not on file  Social History Narrative  . Not on file    Family History:    Family History  Problem Relation Age of  Onset  . Heart attack Mother   . Cancer Mother   . Heart attack Maternal Grandmother      ROS:  Please see the history of present illness.   All other ROS reviewed and negative.     Physical Exam/Data:   Vitals:   12/19/17 1245 12/19/17 1300 12/19/17 1415 12/19/17 1616  BP: (!) 105/48 (!) 100/47 (!) 108/57   Pulse: 69 66 64   Resp: 16 16    Temp:   97.8 F (36.6 C)   TempSrc:   Oral   SpO2: 95% 95% 99% (P) 95%  Weight:   264 lb 14.4 oz (120.2 kg)   Height:   5\' 9"  (1.753 m)     Intake/Output Summary (Last 24 hours) at 12/19/2017 1617 Last data filed at 12/19/2017 1400 Gross per 24 hour  Intake 662.3 ml  Output -  Net 662.3 ml   Filed Weights   12/19/17 0735 12/19/17 1415  Weight: 262 lb (118.8 kg) 264 lb 14.4 oz (120.2 kg)   Body mass index is 39.12 kg/m.  General:  Well nourished, obese, in no acute distress HEENT: normal Lymph: no adenopathy Neck: no JVD Endocrine:  No thryomegaly Vascular: No carotid bruits; FA pulses 2+ bilaterally without bruits  Cardiac:  normal S1, S2; RRR; no murmur  Lungs:  clear to auscultation bilaterally, no wheezing, rhonchi or rales  Abd: soft, nontender, no hepatomegaly  Ext: no edema Musculoskeletal:  No deformities, BUE and BLE strength normal and equal Skin: warm and dry  Neuro:  CNs 2-12 intact, no focal abnormalities noted Psych:  Normal affect   EKG:  The EKG was personally reviewed and demonstrates:  NSR rate 71. Normal Ecg.  Telemetry:  Telemetry was personally reviewed and demonstrates:  NSR  Relevant CV Studies: none  Laboratory Data:  Chemistry Recent Labs  Lab 12/19/17 0825  NA 138  K 4.3  CL 111  CO2 19*  GLUCOSE 184*  BUN 28*  CREATININE  1.41*  CALCIUM 8.3*  GFRNONAA 52*  GFRAA 60*  ANIONGAP 8    No results for input(s): PROT, ALBUMIN, AST, ALT, ALKPHOS, BILITOT in the last 168 hours. Hematology Recent Labs  Lab 12/19/17 0825  WBC 9.1  RBC 3.75*  HGB 11.8*  HCT 34.8*  MCV 92.8  MCH 31.5  MCHC 33.9  RDW 13.4  PLT 228   Cardiac Enzymes Recent Labs  Lab 12/19/17 1442  TROPONINI <0.03    Recent Labs  Lab 12/19/17 0837  TROPIPOC 0.00    BNPNo results for input(s): BNP, PROBNP in the last 168 hours.  DDimer No results for input(s): DDIMER in the last 168 hours.  Radiology/Studies:  Dg Chest Portable 1 View  Result Date: 12/19/2017 CLINICAL DATA:  Chest pain for 1 week. EXAM: PORTABLE CHEST 1 VIEW COMPARISON:  None. FINDINGS: The patient is status post CABG. Heart size is upper normal. The lungs are clear. No pneumothorax or pleural effusion. No acute bony abnormality. IMPRESSION: No acute disease. Electronically Signed   By: Drusilla Kannerhomas  Dalessio M.D.   On: 12/19/2017 08:28    Assessment and Plan:   1. Unstable angina. Progressive symptoms with exertion and now at rest. Symptoms are classic. Known CAD with prior CABG and multiple prior PCIs. Will request records from [place]. Agree with IV heparin and Ntg. Cycle cardiac enzymes. Will continue beta blocker if BP allows. Recommend invasive evaluation Monday. Will need hydration prior to cath. Intolerant to Brilinta in the past so if he  needs intervention will need to use Plavix especially since he is also on anticoagulation 2. CAD s/p CABG x 3. Multiple stents 3. IDDM 4. HLD on statin 5. HTN controlled. 6. ? CKD creatinine 1.4.  7. History of paroxysmal atrial fibrillation. Eliquis on hold for potential cardiac cath.    For questions or updates, please contact [facility] Please consult [email or web address] for contact info under Cardiology/STEMI.   Signed, Toniette Devera [place], MD  12/19/2017 4:17 PM

## 2017-12-20 ENCOUNTER — Other Ambulatory Visit: Payer: Self-pay

## 2017-12-20 DIAGNOSIS — I739 Peripheral vascular disease, unspecified: Secondary | ICD-10-CM | POA: Diagnosis present

## 2017-12-20 DIAGNOSIS — N400 Enlarged prostate without lower urinary tract symptoms: Secondary | ICD-10-CM | POA: Diagnosis present

## 2017-12-20 DIAGNOSIS — Z888 Allergy status to other drugs, medicaments and biological substances status: Secondary | ICD-10-CM | POA: Diagnosis not present

## 2017-12-20 DIAGNOSIS — I4581 Long QT syndrome: Secondary | ICD-10-CM | POA: Diagnosis present

## 2017-12-20 DIAGNOSIS — Z951 Presence of aortocoronary bypass graft: Secondary | ICD-10-CM | POA: Diagnosis not present

## 2017-12-20 DIAGNOSIS — Z7901 Long term (current) use of anticoagulants: Secondary | ICD-10-CM

## 2017-12-20 DIAGNOSIS — I252 Old myocardial infarction: Secondary | ICD-10-CM | POA: Diagnosis not present

## 2017-12-20 DIAGNOSIS — I25700 Atherosclerosis of coronary artery bypass graft(s), unspecified, with unstable angina pectoris: Secondary | ICD-10-CM | POA: Diagnosis not present

## 2017-12-20 DIAGNOSIS — Z8249 Family history of ischemic heart disease and other diseases of the circulatory system: Secondary | ICD-10-CM | POA: Diagnosis not present

## 2017-12-20 DIAGNOSIS — Z6839 Body mass index (BMI) 39.0-39.9, adult: Secondary | ICD-10-CM | POA: Diagnosis not present

## 2017-12-20 DIAGNOSIS — Z794 Long term (current) use of insulin: Secondary | ICD-10-CM | POA: Diagnosis not present

## 2017-12-20 DIAGNOSIS — I480 Paroxysmal atrial fibrillation: Secondary | ICD-10-CM | POA: Diagnosis present

## 2017-12-20 DIAGNOSIS — E1169 Type 2 diabetes mellitus with other specified complication: Secondary | ICD-10-CM | POA: Diagnosis present

## 2017-12-20 DIAGNOSIS — I259 Chronic ischemic heart disease, unspecified: Secondary | ICD-10-CM | POA: Diagnosis not present

## 2017-12-20 DIAGNOSIS — I25710 Atherosclerosis of autologous vein coronary artery bypass graft(s) with unstable angina pectoris: Secondary | ICD-10-CM | POA: Diagnosis present

## 2017-12-20 DIAGNOSIS — I445 Left posterior fascicular block: Secondary | ICD-10-CM | POA: Diagnosis present

## 2017-12-20 DIAGNOSIS — E7800 Pure hypercholesterolemia, unspecified: Secondary | ICD-10-CM | POA: Diagnosis not present

## 2017-12-20 DIAGNOSIS — I200 Unstable angina: Secondary | ICD-10-CM | POA: Diagnosis not present

## 2017-12-20 DIAGNOSIS — Z7982 Long term (current) use of aspirin: Secondary | ICD-10-CM | POA: Diagnosis not present

## 2017-12-20 DIAGNOSIS — I10 Essential (primary) hypertension: Secondary | ICD-10-CM | POA: Diagnosis not present

## 2017-12-20 DIAGNOSIS — I25720 Atherosclerosis of autologous artery coronary artery bypass graft(s) with unstable angina pectoris: Secondary | ICD-10-CM | POA: Diagnosis present

## 2017-12-20 DIAGNOSIS — I25110 Atherosclerotic heart disease of native coronary artery with unstable angina pectoris: Secondary | ICD-10-CM | POA: Diagnosis present

## 2017-12-20 DIAGNOSIS — N189 Chronic kidney disease, unspecified: Secondary | ICD-10-CM | POA: Diagnosis present

## 2017-12-20 DIAGNOSIS — E669 Obesity, unspecified: Secondary | ICD-10-CM | POA: Diagnosis present

## 2017-12-20 DIAGNOSIS — R202 Paresthesia of skin: Secondary | ICD-10-CM | POA: Diagnosis present

## 2017-12-20 DIAGNOSIS — R079 Chest pain, unspecified: Secondary | ICD-10-CM | POA: Diagnosis present

## 2017-12-20 DIAGNOSIS — N179 Acute kidney failure, unspecified: Secondary | ICD-10-CM | POA: Diagnosis present

## 2017-12-20 DIAGNOSIS — E785 Hyperlipidemia, unspecified: Secondary | ICD-10-CM | POA: Diagnosis present

## 2017-12-20 DIAGNOSIS — Z955 Presence of coronary angioplasty implant and graft: Secondary | ICD-10-CM | POA: Diagnosis not present

## 2017-12-20 DIAGNOSIS — E1122 Type 2 diabetes mellitus with diabetic chronic kidney disease: Secondary | ICD-10-CM | POA: Diagnosis present

## 2017-12-20 DIAGNOSIS — I129 Hypertensive chronic kidney disease with stage 1 through stage 4 chronic kidney disease, or unspecified chronic kidney disease: Secondary | ICD-10-CM | POA: Diagnosis present

## 2017-12-20 LAB — CBC
HCT: 39.6 % (ref 39.0–52.0)
HEMOGLOBIN: 13.1 g/dL (ref 13.0–17.0)
MCH: 31.3 pg (ref 26.0–34.0)
MCHC: 33.1 g/dL (ref 30.0–36.0)
MCV: 94.5 fL (ref 78.0–100.0)
Platelets: 269 10*3/uL (ref 150–400)
RBC: 4.19 MIL/uL — AB (ref 4.22–5.81)
RDW: 12.9 % (ref 11.5–15.5)
WBC: 9.7 10*3/uL (ref 4.0–10.5)

## 2017-12-20 LAB — BASIC METABOLIC PANEL
Anion gap: 10 (ref 5–15)
BUN: 17 mg/dL (ref 8–23)
CHLORIDE: 111 mmol/L (ref 98–111)
CO2: 17 mmol/L — ABNORMAL LOW (ref 22–32)
Calcium: 8.6 mg/dL — ABNORMAL LOW (ref 8.9–10.3)
Creatinine, Ser: 1.25 mg/dL — ABNORMAL HIGH (ref 0.61–1.24)
GFR calc Af Amer: >60 mL/min (ref >60)
GFR calc non Af Amer: 60 mL/min — ABNORMAL LOW (ref >60)
Glucose, Bld: 185 mg/dL — ABNORMAL HIGH (ref 70–99)
POTASSIUM: 4.2 mmol/L (ref 3.5–5.1)
SODIUM: 138 mmol/L (ref 135–145)

## 2017-12-20 LAB — TROPONIN I: Troponin I: <0.03 ng/mL (ref <0.03)

## 2017-12-20 LAB — GLUCOSE, CAPILLARY
GLUCOSE-CAPILLARY: 208 mg/dL — AB (ref 70–99)
Glucose-Capillary: 165 mg/dL — ABNORMAL HIGH (ref 70–99)
Glucose-Capillary: 183 mg/dL — ABNORMAL HIGH (ref 70–99)
Glucose-Capillary: 171 mg/dL — ABNORMAL HIGH (ref 70–99)

## 2017-12-20 LAB — HIV ANTIBODY (ROUTINE TESTING W REFLEX): HIV SCREEN 4TH GENERATION: NONREACTIVE

## 2017-12-20 LAB — HEPARIN LEVEL (UNFRACTIONATED): HEPARIN UNFRACTIONATED: 1.78 [IU]/mL — AB (ref 0.30–0.70)

## 2017-12-20 LAB — APTT: aPTT: 65 seconds — ABNORMAL HIGH (ref 24–36)

## 2017-12-20 MED ORDER — SODIUM CHLORIDE 0.9% FLUSH: 3.0000 mL | Freq: Two times a day (BID) | INTRAVENOUS | Status: DC

## 2017-12-20 MED ORDER — SODIUM CHLORIDE 0.9 % WEIGHT BASED INFUSION
1.0000 mL/kg/h | INTRAVENOUS | Status: DC
  Administered 2017-12-21: 1 mL/kg/h via INTRAVENOUS

## 2017-12-20 MED ORDER — SODIUM CHLORIDE 0.9 % WEIGHT BASED INFUSION: 3.0000 mL/kg/h | INTRAVENOUS | Status: AC

## 2017-12-20 MED ORDER — SODIUM CHLORIDE 0.9% FLUSH: 3.0000 mL | INTRAVENOUS | Status: DC | PRN

## 2017-12-20 MED ORDER — SODIUM CHLORIDE 0.9 % IV SOLN: 250.0000 mL | INTRAVENOUS | Status: DC | PRN

## 2017-12-20 NOTE — Progress Notes (Signed)
ANTICOAGULATION CONSULT NOTE  Pharmacy Consult for heparin Indication: ACS/STEMI  Allergies  Allergen Reactions  . Brilinta [Ticagrelor]     Patient Measurements: Height: 5\' 9"  (175.3 cm) Weight: 265 lb 14.4 oz (120.6 kg) IBW/kg (Calculated) : 70.7 Heparin Dosing Weight: 97.5  Vital Signs: Temp: 98.2 F (36.8 C) (07/07 1147) Temp Source: Oral (07/07 1147) BP: 116/57 (07/07 1147) Pulse Rate: 76 (07/07 1147)  Labs: Recent Labs    12/19/17 0825 12/19/17 1129 12/19/17 1442 12/19/17 1850 12/19/17 2145 12/20/17 0533 12/20/17 0737  HGB 11.8*  --   --   --   --  13.1  --   HCT 34.8*  --   --   --   --  39.6  --   PLT 228  --   --   --   --  269  --   APTT  --  27  --  77*  --   --  65*  LABPROT 18.0*  --   --   --   --   --   --   INR 1.51  --   --   --   --   --   --   HEPARINUNFRC  --  >2.20*  --  >2.20*  --   --  1.78*  CREATININE 1.41*  --   --   --   --  1.25*  --   TROPONINI  --   --  <0.03  --  <0.03 <0.03  --     Estimated Creatinine Clearance: 77.6 mL/min (A) (by C-G formula based on SCr of 1.25 mg/dL (H)).   Medical History: Past Medical History:  Diagnosis Date  . Atrial fibrillation (HCC)   . BPH (benign prostatic hyperplasia)   . Coronary artery disease   . Coronary artery disease involving coronary bypass graft   . Diabetes mellitus without complication (HCC)   . GERD (gastroesophageal reflux disease)   . Hypercholesterolemia   . Hypertension   . Myocardial infarct [facility](HCC)     Medications:  Medications Prior to Admission  Medication Sig Dispense Refill Last Dose  . AMLODIPINE BESYLATE PO Take 10 mg by mouth daily.   12/18/2017 at Unknown time  . apixaban (ELIQUIS) 5 MG TABS tablet Take 5 mg by mouth 2 (two) times daily.   12/18/2017 at Unknown time  . aspirin EC 81 MG tablet Take 81 mg by mouth daily.   12/18/2017 at Unknown time  . atorvastatin (LIPITOR) 80 MG tablet Take 80 mg by mouth daily at 6 PM.    12/18/2017 at Unknown time  . carvedilol (COREG)  25 MG tablet Take 25 mg by mouth 2 (two) times daily with a meal.   12/18/2017 at Unknown time  . docusate sodium (COLACE) 100 MG capsule Take 100 mg by mouth 2 (two) times daily.   12/18/2017 at Unknown time  . enalapril (VASOTEC) 10 MG tablet Take 10 mg by mouth 2 (two) times daily.   12/18/2017 at Unknown time  . finasteride (PROSCAR) 5 MG tablet Take 5 mg by mouth daily.   12/18/2017 at Unknown time  . insulin glargine (LANTUS) 100 UNIT/ML injection Inject 21 Units into the skin at bedtime.   12/18/2017 at Unknown time  . isosorbide mononitrate (IMDUR) 60 MG 24 hr tablet Take 60 mg by mouth daily.   12/18/2017 at Unknown time  . metFORMIN (GLUCOPHAGE) 1000 MG tablet Take 1,000 mg by mouth daily.   12/18/2017 at Unknown time  . nitroGLYCERIN (  NITROSTAT) 0.3 MG SL tablet Place 0.3 mg under the tongue every 5 (five) minutes as needed for chest pain.   unknown  . omeprazole (PRILOSEC) 20 MG capsule Take 20 mg by mouth daily.   12/18/2017 at Unknown time  . ranolazine (RANEXA) 500 MG 12 hr tablet Take 500 mg by mouth 2 (two) times daily.   12/18/2017 at Unknown time  . tamsulosin (FLOMAX) 0.4 MG CAPS capsule Take 0.4 mg by mouth daily.   12/18/2017 at Unknown time    Assessment: Pharmacy consulted to dose heparin on patient with unstable angina. Patient on apixaban prior to admission with last dose being given 7/5. Plans noted for cath on Monday -aPTT = 65 and below goal, heparin level = 1.78 (influence of recent xarelto)  Goal of Therapy:  Target aPTT 66-102 s Heparin level 0.3-0.7 units/ml Monitor platelets by anticoagulation protocol: Yes   Plan:   -Increase heparin to 1300 units/hr -Daily heparin level and CBC  Harland German, PharmD Clinical Pharmacist Please check Amion for pharmacy contact number

## 2017-12-20 NOTE — Plan of Care (Signed)
  Problem: Pain Managment: Goal: General experience of comfort will improve Outcome: Progressing   Problem: Coping: Goal: Level of anxiety will decrease Outcome: Completed/Met

## 2017-12-20 NOTE — Progress Notes (Signed)
Progress Note  Patient Name: Kevin Ballard Date of Encounter: 12/20/2017  Primary Cardiologist: No primary care provider on file.   Subjective   Feels well. Just a dull ache in chest now.  Inpatient Medications    Scheduled Meds: . aspirin EC  81 mg Oral Daily  . atorvastatin  80 mg Oral q1800  . carvedilol  3.125 mg Oral BID WC  . docusate sodium  100 mg Oral BID  . finasteride  5 mg Oral Daily  . insulin aspart  0-15 Units Subcutaneous TID WC  . insulin aspart  0-5 Units Subcutaneous QHS  . insulin glargine  21 Units Subcutaneous QHS  . pantoprazole  40 mg Oral Daily  . ranolazine  500 mg Oral BID  . sodium chloride flush  3 mL Intravenous Q12H  . tamsulosin  0.4 mg Oral Daily   Continuous Infusions: . sodium chloride 125 mL/hr at 12/20/17 0324  . sodium chloride    . sodium chloride    . heparin 1,200 Units/hr (12/20/17 0323)  . nitroGLYCERIN 20 mcg/min (12/19/17 1500)   PRN Meds: sodium chloride, acetaminophen, morphine injection, ondansetron (ZOFRAN) IV, sodium chloride flush   Vital Signs    Vitals:   12/19/17 1954 12/20/17 0030 12/20/17 0546 12/20/17 0736  BP: (!) 108/55 (!) 110/58 (!) 110/55 (!) 117/53  Pulse: 68 79 81 70  Resp: 16 17 19 18   Temp: 98 F (36.7 C) 98 F (36.7 C) 98.4 F (36.9 C) 97.8 F (36.6 C)  TempSrc: Oral Oral Oral Oral  SpO2: 95% 96% 97% 96%  Weight:   265 lb 14.4 oz (120.6 kg)   Height:        Intake/Output Summary (Last 24 hours) at 12/20/2017 1042 Last data filed at 12/20/2017 0900 Gross per 24 hour  Intake 3942.65 ml  Output 1850 ml  Net 2092.65 ml   Filed Weights   12/19/17 0735 12/19/17 1415 12/20/17 0546  Weight: 262 lb (118.8 kg) 264 lb 14.4 oz (120.2 kg) 265 lb 14.4 oz (120.6 kg)    Telemetry    NSR - Personally Reviewed  ECG    NSR. No acute ST- T wave changes - Personally Reviewed  Physical Exam   GEN: No acute distress. obese   Neck: No JVD Cardiac: RRR, no murmurs, rubs, or gallops.  Respiratory:  Clear to auscultation bilaterally. GI: Soft, nontender, non-distended  MS: No edema; No deformity. Neuro:  Nonfocal  Psych: Normal affect   Labs    Chemistry Recent Labs  Lab 12/19/17 0825 12/20/17 0533  NA 138 138  K 4.3 4.2  CL 111 111  CO2 19* 17*  GLUCOSE 184* 185*  BUN 28* 17  CREATININE 1.41* 1.25*  CALCIUM 8.3* 8.6*  GFRNONAA 52* 60*  GFRAA 60* >60  ANIONGAP 8 10     Hematology Recent Labs  Lab 12/19/17 0825 12/20/17 0533  WBC 9.1 9.7  RBC 3.75* 4.19*  HGB 11.8* 13.1  HCT 34.8* 39.6  MCV 92.8 94.5  MCH 31.5 31.3  MCHC 33.9 33.1  RDW 13.4 12.9  PLT 228 269    Cardiac Enzymes Recent Labs  Lab 12/19/17 1442 12/19/17 2145 12/20/17 0533  TROPONINI <0.03 <0.03 <0.03    Recent Labs  Lab 12/19/17 0837  TROPIPOC 0.00     BNPNo results for input(s): BNP, PROBNP in the last 168 hours.   DDimer No results for input(s): DDIMER in the last 168 hours.   Radiology    Dg Chest Portable 1  View  Result Date: 12/19/2017 CLINICAL DATA:  Chest pain for 1 week. EXAM: PORTABLE CHEST 1 VIEW COMPARISON:  None. FINDINGS: The patient is status post CABG. Heart size is upper normal. The lungs are clear. No pneumothorax or pleural effusion. No acute bony abnormality. IMPRESSION: No acute disease. Electronically Signed   By: Drusilla Kannerhomas  Dalessio M.D.   On: 12/19/2017 08:28    Cardiac Studies   none  Patient Profile     62 y.o. male with a hx of CAD s/p CABG and multiple prior stents  who is being seen today for the evaluation of increasing chest pain    Assessment & Plan    1. Unstable angina. Progressive symptoms with exertion and now at rest. Symptoms are fairly classic. Known CAD with prior CABG and multiple prior PCIs. Will request records from [place]- still pending. Continue IV heparin and Ntg. Troponin levels all normal and no acute Ecg changes. Will continue lower dose beta blocker if BP allows. Recommend invasive evaluation Monday. Will need hydration prior  to cath. Intolerant to Brilinta in the past so if he needs intervention will need to use Plavix especially since he is also on anticoagulation 2. CAD s/p CABG x 3. Multiple stents 3. IDDM 4. HLD on statin 5. HTN controlled. 6. ? CKD creatinine improved 1.41- 1.25.  7. History of paroxysmal atrial fibrillation. Eliquis on hold for potential cardiac cath.         For questions or updates, please contact [facility] Please consult [email or web address] for contact info under Cardiology/STEMI.      Signed, Talis Iwan [place], MD  12/20/2017, 10:42 AM

## 2017-12-20 NOTE — H&P (View-Only) (Signed)
 Progress Note  Patient Name: Kevin Ballard Date of Encounter: 12/20/2017  Primary Cardiologist: No primary care provider on file.   Subjective   Feels well. Just a dull ache in chest now.  Inpatient Medications    Scheduled Meds: . aspirin EC  81 mg Oral Daily  . atorvastatin  80 mg Oral q1800  . carvedilol  3.125 mg Oral BID WC  . docusate sodium  100 mg Oral BID  . finasteride  5 mg Oral Daily  . insulin aspart  0-15 Units Subcutaneous TID WC  . insulin aspart  0-5 Units Subcutaneous QHS  . insulin glargine  21 Units Subcutaneous QHS  . pantoprazole  40 mg Oral Daily  . ranolazine  500 mg Oral BID  . sodium chloride flush  3 mL Intravenous Q12H  . tamsulosin  0.4 mg Oral Daily   Continuous Infusions: . sodium chloride 125 mL/hr at 12/20/17 0324  . sodium chloride    . sodium chloride    . heparin 1,200 Units/hr (12/20/17 0323)  . nitroGLYCERIN 20 mcg/min (12/19/17 1500)   PRN Meds: sodium chloride, acetaminophen, morphine injection, ondansetron (ZOFRAN) IV, sodium chloride flush   Vital Signs    Vitals:   12/19/17 1954 12/20/17 0030 12/20/17 0546 12/20/17 0736  BP: (!) 108/55 (!) 110/58 (!) 110/55 (!) 117/53  Pulse: 68 79 81 70  Resp: 16 17 19 18  Temp: 98 F (36.7 C) 98 F (36.7 C) 98.4 F (36.9 C) 97.8 F (36.6 C)  TempSrc: Oral Oral Oral Oral  SpO2: 95% 96% 97% 96%  Weight:   265 lb 14.4 oz (120.6 kg)   Height:        Intake/Output Summary (Last 24 hours) at 12/20/2017 1042 Last data filed at 12/20/2017 0900 Gross per 24 hour  Intake 3942.65 ml  Output 1850 ml  Net 2092.65 ml   Filed Weights   12/19/17 0735 12/19/17 1415 12/20/17 0546  Weight: 262 lb (118.8 kg) 264 lb 14.4 oz (120.2 kg) 265 lb 14.4 oz (120.6 kg)    Telemetry    NSR - Personally Reviewed  ECG    NSR. No acute ST- T wave changes - Personally Reviewed  Physical Exam   GEN: No acute distress. obese   Neck: No JVD Cardiac: RRR, no murmurs, rubs, or gallops.  Respiratory:  Clear to auscultation bilaterally. GI: Soft, nontender, non-distended  MS: No edema; No deformity. Neuro:  Nonfocal  Psych: Normal affect   Labs    Chemistry Recent Labs  Lab 12/19/17 0825 12/20/17 0533  NA 138 138  K 4.3 4.2  CL 111 111  CO2 19* 17*  GLUCOSE 184* 185*  BUN 28* 17  CREATININE 1.41* 1.25*  CALCIUM 8.3* 8.6*  GFRNONAA 52* 60*  GFRAA 60* >60  ANIONGAP 8 10     Hematology Recent Labs  Lab 12/19/17 0825 12/20/17 0533  WBC 9.1 9.7  RBC 3.75* 4.19*  HGB 11.8* 13.1  HCT 34.8* 39.6  MCV 92.8 94.5  MCH 31.5 31.3  MCHC 33.9 33.1  RDW 13.4 12.9  PLT 228 269    Cardiac Enzymes Recent Labs  Lab 12/19/17 1442 12/19/17 2145 12/20/17 0533  TROPONINI <0.03 <0.03 <0.03    Recent Labs  Lab 12/19/17 0837  TROPIPOC 0.00     BNPNo results for input(s): BNP, PROBNP in the last 168 hours.   DDimer No results for input(s): DDIMER in the last 168 hours.   Radiology    Dg Chest Portable 1   View  Result Date: 12/19/2017 CLINICAL DATA:  Chest pain for 1 week. EXAM: PORTABLE CHEST 1 VIEW COMPARISON:  None. FINDINGS: The patient is status post CABG. Heart size is upper normal. The lungs are clear. No pneumothorax or pleural effusion. No acute bony abnormality. IMPRESSION: No acute disease. Electronically Signed   By: Thomas  Dalessio M.D.   On: 12/19/2017 08:28    Cardiac Studies   none  Patient Profile     62 y.o. male with a hx of CAD s/p CABG and multiple prior stents  who is being seen today for the evaluation of increasing chest pain    Assessment & Plan    1. Unstable angina. Progressive symptoms with exertion and now at rest. Symptoms are fairly classic. Known CAD with prior CABG and multiple prior PCIs. Will request records from [place]- still pending. Continue IV heparin and Ntg. Troponin levels all normal and no acute Ecg changes. Will continue lower dose beta blocker if BP allows. Recommend invasive evaluation Monday. Will need hydration prior  to cath. Intolerant to Brilinta in the past so if he needs intervention will need to use Plavix especially since he is also on anticoagulation 2. CAD s/p CABG x 3. Multiple stents 3. IDDM 4. HLD on statin 5. HTN controlled. 6. ? CKD creatinine improved 1.41- 1.25.  7. History of paroxysmal atrial fibrillation. Eliquis on hold for potential cardiac cath.         For questions or updates, please contact [facility] Please consult [email or web address] for contact info under Cardiology/STEMI.      Signed, Ulys Favia, MD  12/20/2017, 10:42 AM    

## 2017-12-20 NOTE — Progress Notes (Signed)
[facility] TEAM 1 - Stepdown/ICU Vianne BullsEAM  Jordyn XxxDenny  ZOX:[id number] DOB: 27-Jun-1955 DOA: 12/19/2017 PCP: Patient, No Pcp Per    Brief Narrative:  62 y.o. M w/ a hx of HTN, DM, CAD s/p CABG approximately 3 years ago with stent placement 2 years ago who presented w/ classic anginal type chest pain on exertion for a week that resolves with nitroglycerin.   Significant Events: 7/6 admit at [facility] - [facility]   Subjective: Resting comfortably in bed. Some aching pain in chest but no pressure.  Denies sob, n/v, or abdom pain.    Assessment & Plan:  Angina - hx of CAD s/p CABG and subsequent stenting  Cont medical tx w/ plan for cardiac cath in AM  Acute kidney injury v/s CKD Hydrate and follow - crt is improving - no baseline crt for reference   Recent Labs  Lab 12/19/17 0825 12/20/17 0533  CREATININE 1.41* 1.25*    Parox Afib  Cont BB - holding eliquis for impending cath > IV heparin   DM CBG reasonably controlled - follow w/o change for today   HLD Lipitor at max dose already   BPH  DVT prophylaxis: heparin gtt  Code Status: FULL CODE Family Communication: no family present at time of exam  Disposition Plan: awaiting cardiac cath in AM  Consultants:  Cardiology   Antimicrobials:  None   Objective: Blood pressure (!) 117/53, pulse 70, temperature 97.8 F (36.6 C), temperature source Oral, resp. rate 18, height 5\' 9"  (1.753 m), weight 120.6 kg (265 lb 14.4 oz), SpO2 96 %.  Intake/Output Summary (Last 24 hours) at 12/20/2017 1112 Last data filed at 12/20/2017 0900 Gross per 24 hour  Intake 3942.65 ml  Output 1850 ml  Net 2092.65 ml   Filed Weights   12/19/17 0735 12/19/17 1415 12/20/17 0546  Weight: 118.8 kg (262 lb) 120.2 kg (264 lb 14.4 oz) 120.6 kg (265 lb 14.4 oz)    Examination: General: No acute respiratory distress Lungs: Clear to auscultation bilaterally without wheezes or crackles Cardiovascular: Regular rate and rhythm without murmur gallop or  rub normal S1 and S2 Abdomen: Nontender, nondistended, soft, bowel sounds positive, no rebound, no ascites, no appreciable mass Extremities: No significant cyanosis, clubbing, or edema bilateral lower extremities  CBC: Recent Labs  Lab 12/19/17 0825 12/20/17 0533  WBC 9.1 9.7  HGB 11.8* 13.1  HCT 34.8* 39.6  MCV 92.8 94.5  PLT 228 269   Basic Metabolic Panel: Recent Labs  Lab 12/19/17 0825 12/20/17 0533  NA 138 138  K 4.3 4.2  CL 111 111  CO2 19* 17*  GLUCOSE 184* 185*  BUN 28* 17  CREATININE 1.41* 1.25*  CALCIUM 8.3* 8.6*   GFR: Estimated Creatinine Clearance: 77.6 mL/min (A) (by C-G formula based on SCr of 1.25 mg/dL (H)).  Liver Function Tests: No results for input(s): AST, ALT, ALKPHOS, BILITOT, PROT, ALBUMIN in the last 168 hours. No results for input(s): LIPASE, AMYLASE in the last 168 hours. No results for input(s): AMMONIA in the last 168 hours.  Coagulation Profile: Recent Labs  Lab 12/19/17 0825  INR 1.51    Cardiac Enzymes: Recent Labs  Lab 12/19/17 1442 12/19/17 2145 12/20/17 0533  TROPONINI <0.03 <0.03 <0.03    HbA1C: No results found for: HGBA1C  CBG: Recent Labs  Lab 12/19/17 1629 12/19/17 2100 12/20/17 0732  GLUCAP 162* 171* 165*    Recent Results (from the past 240 hour(s))  MRSA PCR Screening  Status: None   Collection Time: 12/19/17  2:35 PM  Result Value Ref Range Status   MRSA by PCR NEGATIVE NEGATIVE Final    Comment:        The GeneXpert MRSA Assay (FDA approved for NASAL specimens only), is one component of a comprehensive MRSA colonization surveillance program. It is not intended to diagnose MRSA infection nor to guide or monitor treatment for MRSA infections. Performed at [facility], [address], [place], [place] [address]      Scheduled Meds: . aspirin EC  81 mg Oral Daily  . atorvastatin  80 mg Oral q1800  . carvedilol  3.125 mg Oral BID WC  . docusate sodium  100 mg Oral BID  .  finasteride  5 mg Oral Daily  . insulin aspart  0-15 Units Subcutaneous TID WC  . insulin aspart  0-5 Units Subcutaneous QHS  . insulin glargine  21 Units Subcutaneous QHS  . pantoprazole  40 mg Oral Daily  . ranolazine  500 mg Oral BID  . sodium chloride flush  3 mL Intravenous Q12H  . tamsulosin  0.4 mg Oral Daily   Continuous Infusions: . sodium chloride 125 mL/hr at 12/20/17 0324  . sodium chloride    . sodium chloride    . heparin 1,200 Units/hr (12/20/17 0323)  . nitroGLYCERIN 20 mcg/min (12/19/17 1500)     LOS: 0 days   Lonia Blood, MD [facility] Office  [phone] Pager - Text Page per Amion as per below:  On-Call/Text Page:      [email or web address]      password [email or web address]  If 7PM-7AM, please contact night-coverage [email or web address] Password [email or web address] 12/20/2017, 11:12 AM

## 2017-12-21 ENCOUNTER — Other Ambulatory Visit: Payer: Self-pay

## 2017-12-21 ENCOUNTER — Encounter (HOSPITAL_COMMUNITY): Payer: Self-pay | Admitting: Cardiology

## 2017-12-21 ENCOUNTER — Inpatient Hospital Stay (HOSPITAL_COMMUNITY): Admission: EM | Payer: Self-pay | Attending: Internal Medicine

## 2017-12-21 DIAGNOSIS — I25700 Atherosclerosis of coronary artery bypass graft(s), unspecified, with unstable angina pectoris: Secondary | ICD-10-CM

## 2017-12-21 DIAGNOSIS — I200 Unstable angina: Secondary | ICD-10-CM

## 2017-12-21 DIAGNOSIS — E7800 Pure hypercholesterolemia, unspecified: Secondary | ICD-10-CM

## 2017-12-21 DIAGNOSIS — I480 Paroxysmal atrial fibrillation: Secondary | ICD-10-CM

## 2017-12-21 DIAGNOSIS — I10 Essential (primary) hypertension: Secondary | ICD-10-CM

## 2017-12-21 HISTORY — PX: LEFT HEART CATH AND CORS/GRAFTS ANGIOGRAPHY: CATH118250

## 2017-12-21 LAB — CBC
HCT: 34 % — ABNORMAL LOW (ref 39.0–52.0)
Hemoglobin: 11.3 g/dL — ABNORMAL LOW (ref 13.0–17.0)
MCH: 31.1 pg (ref 26.0–34.0)
MCHC: 33.2 g/dL (ref 30.0–36.0)
MCV: 93.7 fL (ref 78.0–100.0)
Platelets: 202 10*3/uL (ref 150–400)
RBC: 3.63 MIL/uL — ABNORMAL LOW (ref 4.22–5.81)
RDW: 12.9 % (ref 11.5–15.5)
WBC: 7.7 10*3/uL (ref 4.0–10.5)

## 2017-12-21 LAB — HEPARIN LEVEL (UNFRACTIONATED): Heparin Unfractionated: 0.8 IU/mL — ABNORMAL HIGH (ref 0.30–0.70)

## 2017-12-21 LAB — APTT: aPTT: 54 seconds — ABNORMAL HIGH (ref 24–36)

## 2017-12-21 LAB — HEPATIC FUNCTION PANEL
ALBUMIN: 3.2 g/dL — AB (ref 3.5–5.0)
ALK PHOS: 46 U/L (ref 38–126)
ALT: 12 U/L (ref 0–44)
AST: 11 U/L — AB (ref 15–41)
BILIRUBIN TOTAL: 0.6 mg/dL (ref 0.3–1.2)
Bilirubin, Direct: 0.1 mg/dL (ref 0.0–0.2)
Indirect Bilirubin: 0.5 mg/dL (ref 0.3–0.9)
TOTAL PROTEIN: 5.4 g/dL — AB (ref 6.5–8.1)

## 2017-12-21 LAB — BASIC METABOLIC PANEL
Anion gap: 6 (ref 5–15)
BUN: 12 mg/dL (ref 8–23)
CO2: 19 mmol/L — AB (ref 22–32)
Calcium: 8.1 mg/dL — ABNORMAL LOW (ref 8.9–10.3)
Chloride: 113 mmol/L — ABNORMAL HIGH (ref 98–111)
Creatinine, Ser: 1.28 mg/dL — ABNORMAL HIGH (ref 0.61–1.24)
GFR calc non Af Amer: 58 mL/min — ABNORMAL LOW (ref >60)
GLUCOSE: 144 mg/dL — AB (ref 70–99)
Potassium: 4 mmol/L (ref 3.5–5.1)
SODIUM: 138 mmol/L (ref 135–145)

## 2017-12-21 LAB — GLUCOSE, CAPILLARY
GLUCOSE-CAPILLARY: 150 mg/dL — AB (ref 70–99)
Glucose-Capillary: 137 mg/dL — ABNORMAL HIGH (ref 70–99)
Glucose-Capillary: 144 mg/dL — ABNORMAL HIGH (ref 70–99)
Glucose-Capillary: 227 mg/dL — ABNORMAL HIGH (ref 70–99)

## 2017-12-21 LAB — D-DIMER, QUANTITATIVE: D-Dimer, Quant: <0.27 ug/mL-FEU (ref 0.00–0.50)

## 2017-12-21 SURGERY — LEFT HEART CATH AND CORS/GRAFTS ANGIOGRAPHY
Anesthesia: LOCAL

## 2017-12-21 MED ORDER — CARVEDILOL 6.25 MG PO TABS
6.2500 mg | ORAL_TABLET | Freq: Two times a day (BID) | ORAL | Status: DC
  Administered 2017-12-21 – 2017-12-22 (×2): 6.25 mg via ORAL
  Filled 2017-12-21 (×2): qty 1

## 2017-12-21 MED ORDER — SODIUM CHLORIDE 0.9% FLUSH: 3.0000 mL | Freq: Two times a day (BID) | INTRAVENOUS | Status: DC

## 2017-12-21 MED ORDER — HEPARIN SODIUM (PORCINE) 1000 UNIT/ML IJ SOLN
INTRAMUSCULAR | Status: DC | PRN
  Administered 2017-12-21: 6000 [IU] via INTRAVENOUS

## 2017-12-21 MED ORDER — SODIUM CHLORIDE 0.9 % IV SOLN: 250.0000 mL | INTRAVENOUS | Status: DC | PRN

## 2017-12-21 MED ORDER — FENTANYL CITRATE (PF) 100 MCG/2ML IJ SOLN
INTRAMUSCULAR | Status: AC
  Filled 2017-12-21: qty 2

## 2017-12-21 MED ORDER — ISOSORBIDE MONONITRATE ER 30 MG PO TB24
30.0000 mg | ORAL_TABLET | Freq: Every day | ORAL | Status: DC
  Administered 2017-12-21 – 2017-12-22 (×2): 30 mg via ORAL
  Filled 2017-12-21 (×2): qty 1

## 2017-12-21 MED ORDER — LIDOCAINE HCL (PF) 1 % IJ SOLN
INTRAMUSCULAR | Status: DC | PRN
  Administered 2017-12-21: 2 mL

## 2017-12-21 MED ORDER — IOPAMIDOL (ISOVUE-370) INJECTION 76%
INTRAVENOUS | Status: AC
  Filled 2017-12-21: qty 125

## 2017-12-21 MED ORDER — VERAPAMIL HCL 2.5 MG/ML IV SOLN
INTRAVENOUS | Status: AC
  Filled 2017-12-21: qty 2

## 2017-12-21 MED ORDER — PANTOPRAZOLE SODIUM 40 MG PO TBEC
40.0000 mg | DELAYED_RELEASE_TABLET | Freq: Two times a day (BID) | ORAL | Status: DC
  Administered 2017-12-21 – 2017-12-22 (×2): 40 mg via ORAL
  Filled 2017-12-21 (×2): qty 1

## 2017-12-21 MED ORDER — FENTANYL CITRATE (PF) 100 MCG/2ML IJ SOLN
INTRAMUSCULAR | Status: DC | PRN
  Administered 2017-12-21: 25 ug via INTRAVENOUS

## 2017-12-21 MED ORDER — HEPARIN (PORCINE) IN NACL 1000-0.9 UT/500ML-% IV SOLN
INTRAVENOUS | Status: AC
  Filled 2017-12-21: qty 1000

## 2017-12-21 MED ORDER — SODIUM CHLORIDE 0.9% FLUSH
3.0000 mL | INTRAVENOUS | Status: DC | PRN
Start: 2017-12-22 — End: 2017-12-22

## 2017-12-21 MED ORDER — IOPAMIDOL (ISOVUE-370) INJECTION 76%
INTRAVENOUS | Status: DC | PRN
  Administered 2017-12-21: 115 mL via INTRA_ARTERIAL

## 2017-12-21 MED ORDER — HEPARIN (PORCINE) IN NACL 2-0.9 UNITS/ML
INTRAMUSCULAR | Status: AC | PRN
  Administered 2017-12-21 (×2): 500 mL

## 2017-12-21 MED ORDER — APIXABAN 5 MG PO TABS
5.0000 mg | ORAL_TABLET | Freq: Two times a day (BID) | ORAL | Status: DC
  Administered 2017-12-22: 5 mg via ORAL
  Filled 2017-12-21: qty 1

## 2017-12-21 MED ORDER — ENOXAPARIN SODIUM 40 MG/0.4ML ~~LOC~~ SOLN: 40.0000 mg | SUBCUTANEOUS | Status: DC

## 2017-12-21 MED ORDER — MIDAZOLAM HCL 2 MG/2ML IJ SOLN
INTRAMUSCULAR | Status: DC | PRN
  Administered 2017-12-21: 1 mg via INTRAVENOUS

## 2017-12-21 MED ORDER — SODIUM CHLORIDE 0.9 % WEIGHT BASED INFUSION
1.0000 mL/kg/h | INTRAVENOUS | Status: AC
  Administered 2017-12-21: 1 mL/kg/h via INTRAVENOUS

## 2017-12-21 MED ORDER — HEPARIN (PORCINE) IN NACL 2-0.9 UNITS/ML
INTRAMUSCULAR | Status: DC | PRN
  Administered 2017-12-21: 14:00:00 via INTRA_ARTERIAL

## 2017-12-21 MED ORDER — LIDOCAINE HCL (PF) 1 % IJ SOLN
INTRAMUSCULAR | Status: AC
  Filled 2017-12-21: qty 30

## 2017-12-21 MED ORDER — MIDAZOLAM HCL 2 MG/2ML IJ SOLN
INTRAMUSCULAR | Status: AC
  Filled 2017-12-21: qty 2

## 2017-12-21 MED ORDER — ASPIRIN 81 MG PO CHEW
CHEWABLE_TABLET | ORAL | Status: AC
  Administered 2017-12-21: 81 mg
  Filled 2017-12-21: qty 1

## 2017-12-21 MED ORDER — HEPARIN SODIUM (PORCINE) 1000 UNIT/ML IJ SOLN
INTRAMUSCULAR | Status: AC
  Filled 2017-12-21: qty 1

## 2017-12-21 MED ORDER — SODIUM CHLORIDE 0.9 % IV SOLN
INTRAVENOUS | Status: DC | PRN
  Administered 2017-12-21: 10 mL/h via INTRAVENOUS

## 2017-12-21 MED ORDER — GI COCKTAIL ~~LOC~~: 30.0000 mL | Freq: Three times a day (TID) | ORAL | Status: DC | PRN

## 2017-12-21 SURGICAL SUPPLY — 11 items
CATH INFINITI 5FR AL1 (CATHETERS) ×2 IMPLANT
CATH INFINITI 5FR MULTPACK ANG (CATHETERS) ×2 IMPLANT
DEVICE RAD COMP TR BAND LRG (VASCULAR PRODUCTS) ×2 IMPLANT
GLIDESHEATH SLEND SS 6F .021 (SHEATH) ×2 IMPLANT
GUIDEWIRE INQWIRE 1.5J.035X260 (WIRE) ×1 IMPLANT
INQWIRE 1.5J .035X260CM (WIRE) ×2
KIT HEART LEFT (KITS) ×2 IMPLANT
PACK CARDIAC CATHETERIZATION (CUSTOM PROCEDURE TRAY) ×2 IMPLANT
SYR MEDRAD MARK V 150ML (SYRINGE) ×2 IMPLANT
TRANSDUCER W/STOPCOCK (MISCELLANEOUS) ×2 IMPLANT
TUBING CIL FLEX 10 FLL-RA (TUBING) ×2 IMPLANT

## 2017-12-21 NOTE — Progress Notes (Signed)
ANTICOAGULATION CONSULT NOTE  Pharmacy Consult for heparin Indication: ACS/STEMI  Allergies  Allergen Reactions  . Brilinta [Ticagrelor]     Patient Measurements: Height: 5\' 9"  (175.3 cm) Weight: 266 lb 8 oz (120.9 kg) IBW/kg (Calculated) : 70.7 Heparin Dosing Weight: 97.5  Vital Signs: Temp: 99.1 F (37.3 C) (07/08 0527) Temp Source: Oral (07/08 0527) BP: 118/58 (07/08 0527) Pulse Rate: 77 (07/08 0527)  Labs: Recent Labs    12/19/17 0825  12/19/17 1442 12/19/17 1850 12/19/17 2145 12/20/17 0533 12/20/17 0737 12/21/17 0438  HGB 11.8*  --   --   --   --  13.1  --  11.3*  HCT 34.8*  --   --   --   --  39.6  --  34.0*  PLT 228  --   --   --   --  269  --  202  APTT  --    < >  --  77*  --   --  65* 54*  LABPROT 18.0*  --   --   --   --   --   --   --   INR 1.51  --   --   --   --   --   --   --   HEPARINUNFRC  --    < >  --  >2.20*  --   --  1.78* 0.80*  CREATININE 1.41*  --   --   --   --  1.25*  --  1.28*  TROPONINI  --   --  <0.03  --  <0.03 <0.03  --   --    < > = values in this interval not displayed.    Estimated Creatinine Clearance: 75.9 mL/min (A) (by C-G formula based on SCr of 1.28 mg/dL (H)).     Assessment: Pharmacy consulted to dose heparin on patient with unstable angina. Patient on apixaban prior to admission with last dose being given 7/5. Plans noted for cath on Monday  PTT low this AM  Goal of Therapy:  Target aPTT 66-102 s Heparin level 0.3-0.7 units/ml Monitor platelets by anticoagulation protocol: Yes   Plan:   -Increase heparin to 1500 units/hr -Daily heparin level, PTT,  and CBC -Follow up after cath - resume apixaban  Thank you Okey RegalLisa Vannessa Godown, PharmD [phone]

## 2017-12-21 NOTE — Plan of Care (Signed)
  Problem: Health Behavior/Discharge Planning: Goal: Ability to manage health-related needs will improve Outcome: Completed/Met   Problem: Activity: Goal: Risk for activity intolerance will decrease Outcome: Completed/Met

## 2017-12-21 NOTE — Progress Notes (Signed)
Progress Note  Patient Name: Jonny RuizJohn XxxDenny Date of Encounter: 12/21/2017  Primary Cardiologist: [facility] [facility]  Subjective   Waiting cath today. Still with chest discomfort.   Inpatient Medications    Scheduled Meds: . aspirin EC  81 mg Oral Daily  . atorvastatin  80 mg Oral q1800  . carvedilol  3.125 mg Oral BID WC  . docusate sodium  100 mg Oral BID  . finasteride  5 mg Oral Daily  . insulin aspart  0-15 Units Subcutaneous TID WC  . insulin aspart  0-5 Units Subcutaneous QHS  . insulin glargine  21 Units Subcutaneous QHS  . pantoprazole  40 mg Oral Daily  . ranolazine  500 mg Oral BID  . sodium chloride flush  3 mL Intravenous Q12H  . tamsulosin  0.4 mg Oral Daily   Continuous Infusions: . sodium chloride Stopped (12/21/17 0620)  . sodium chloride    . sodium chloride 1 mL/kg/hr (12/21/17 0617)  . heparin 1,500 Units/hr (12/21/17 0830)  . nitroGLYCERIN 20 mcg/min (12/21/17 0500)   PRN Meds: sodium chloride, acetaminophen, morphine injection, ondansetron (ZOFRAN) IV, sodium chloride flush   Vital Signs    Vitals:   12/20/17 1844 12/20/17 1942 12/21/17 0023 12/21/17 0527  BP: (!) 139/52 (!) 118/55 (!) 114/53 (!) 118/58  Pulse: 81 80 81 77  Resp: 17 (!) 21 19 19   Temp: 98.6 F (37 C) 98.8 F (37.1 C) 98.4 F (36.9 C) 99.1 F (37.3 C)  TempSrc: Oral Oral Oral Oral  SpO2: 96% 95% 94% 95%  Weight:    266 lb 8 oz (120.9 kg)  Height:        Intake/Output Summary (Last 24 hours) at 12/21/2017 1116 Last data filed at 12/21/2017 1016 Gross per 24 hour  Intake 3359.22 ml  Output 4085 ml  Net -725.78 ml   Filed Weights   12/19/17 1415 12/20/17 0546 12/21/17 0527  Weight: 264 lb 14.4 oz (120.2 kg) 265 lb 14.4 oz (120.6 kg) 266 lb 8 oz (120.9 kg)    Telemetry    SR with PACs- Personally Reviewed  ECG    No new tracing   Physical Exam   GEN: No acute distress.   Neck: No JVD Cardiac: RRR, no murmurs, rubs, or gallops.  Respiratory: Clear to auscultation  bilaterally. GI: Soft, nontender, non-distended  MS: No edema; No deformity. Neuro:  Nonfocal  Psych: Normal affect   Labs    Chemistry Recent Labs  Lab 12/19/17 0825 12/20/17 0533 12/21/17 0438  NA 138 138 138  K 4.3 4.2 4.0  CL 111 111 113*  CO2 19* 17* 19*  GLUCOSE 184* 185* 144*  BUN 28* 17 12  CREATININE 1.41* 1.25* 1.28*  CALCIUM 8.3* 8.6* 8.1*  PROT  --   --  5.4*  ALBUMIN  --   --  3.2*  AST  --   --  11*  ALT  --   --  12  ALKPHOS  --   --  46  BILITOT  --   --  0.6  GFRNONAA 52* 60* 58*  GFRAA 60* >60 >60  ANIONGAP 8 10 6      Hematology Recent Labs  Lab 12/19/17 0825 12/20/17 0533 12/21/17 0438  WBC 9.1 9.7 7.7  RBC 3.75* 4.19* 3.63*  HGB 11.8* 13.1 11.3*  HCT 34.8* 39.6 34.0*  MCV 92.8 94.5 93.7  MCH 31.5 31.3 31.1  MCHC 33.9 33.1 33.2  RDW 13.4 12.9 12.9  PLT 228 269 202  Cardiac Enzymes Recent Labs  Lab 12/19/17 1442 12/19/17 2145 12/20/17 0533  TROPONINI <0.03 <0.03 <0.03    Recent Labs  Lab 12/19/17 0837  TROPIPOC 0.00     Radiology    No results found.  Cardiac Studies   Pending LHC today   Patient Profile     62 y.o. male with a hx of CAD s/p CABG and multiple prior stentswho is being seen today for the evaluation of increasing chest pain.    Assessment & Plan    1. Unstable angina. Progressive symptoms with exertion and now at rest. Symptoms are fairly classic. Known CAD with prior CABG and multiple prior PCIs. Still pending records from [place]. Continue IV heparin and Ntg. Troponin l negativel and no acute Ecg changes. Continue lower dose beta blocker. For cath today.  Intolerant to Brilinta in the past so if he needs intervention will need to use Plavix especially since he is also on anticoagulation 2. CAD s/p CABG x 3. Multiple stents 3. IDDM 4. HLD on statin. HTN controlled. 5. ? CKD creatinine improved 1.41- 1.25>>1.28.  6. History of paroxysmal atrial fibrillation. Eliquis on hold for cardiac cath.  Maintaining sinus rhythm.    For questions or updates, please contact [facility] Please consult [email or web address] for contact info under Cardiology/STEMI.      Lorelei Pont, PA  12/21/2017, 11:16 AM

## 2017-12-21 NOTE — Interval H&P Note (Signed)
History and Physical Interval Note:  12/21/2017 1:50 PM  Kevin RuizJohn Ballard  has presented today for surgery, with the diagnosis of unstable angina  The various methods of treatment have been discussed with the patient and family. After consideration of risks, benefits and other options for treatment, the patient has consented to  Procedure(s): LEFT HEART CATH AND CORS/GRAFTS ANGIOGRAPHY (N/A) as a surgical intervention .  The patient's history has been reviewed, patient examined, no change in status, stable for surgery.  I have reviewed the patient's chart and labs.  Questions were answered to the patient's satisfaction.   Cath Lab Visit (complete for each Cath Lab visit)  Clinical Evaluation Leading to the Procedure:   ACS: Yes.    Non-ACS:    Anginal Classification: CCS IV  Anti-ischemic medical therapy: Maximal Therapy (2 or more classes of medications)  Non-Invasive Test Results: No non-invasive testing performed  Prior CABG: Previous CABG        Theron Aristaeter [facility]MD,FACC 12/21/2017 1:50 PM'

## 2017-12-21 NOTE — Progress Notes (Signed)
[facility] TEAM 1 - Stepdown/ICU Kevin Ballard  Kevin Ballard  ZOX:[id number] DOB: June 01, 1956 DOA: 12/19/2017 PCP: Patient, No Pcp Per    Brief Narrative:  62 y.o. M w/ a hx of HTN, DM, CAD s/p CABG approximately 3 years ago with stent placement 2 years ago who presented w/ classic anginal type chest pain on exertion for a week that resolves with nitroglycerin.   Significant Events: 7/6 admit at [facility] - transfer to [facility]   Subjective: Resting comfortably in bed, but does report some ongoing chest pain.  Denies sob, n/v, or abdom pain.    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   DVT prophylaxis: heparin gtt  Code Status: FULL CODE Family Communication: no family present at time of exam  Disposition Plan: evaluate for other life threatening sources of chest pain - transition back to oral anticoag - likey pt will be ready for d/c 12/22/17  Consultants:  Cardiology   Antimicrobials:  None   Objective: Blood pressure 121/61, pulse 75, temperature 99.1 F (37.3 C), temperature source  Oral, resp. rate 11, height 5\' 9"  (1.753 m), weight 120.9 kg (266 lb 8 oz), SpO2 98 %.  Intake/Output Summary (Last 24 hours) at 12/21/2017 1458 Last data filed at 12/21/2017 1311 Gross per 24 hour  Intake 2664.67 ml  Output 3385 ml  Net -720.33 ml   Filed Weights   12/19/17 1415 12/20/17 0546 12/21/17 0527  Weight: 120.2 kg (264 lb 14.4 oz) 120.6 kg (265 lb 14.4 oz) 120.9 kg (266 lb 8 oz)    Examination: General: No acute respiratory distress - alert and oriented  Lungs: Clear to auscultation bilaterally - no wheezing  Cardiovascular: RRR - no M or rub  Abdomen: NT, protuberant, soft, bs+, no mass, no rebound  Extremities: 1+ edema B LE  CBC: Recent Labs  Lab 12/19/17 0825 12/20/17 0533 12/21/17 0438  WBC 9.1 9.7 7.7  HGB 11.8* 13.1 11.3*  HCT 34.8* 39.6 34.0*  MCV 92.8 94.5 93.7  PLT 228 269 202   Basic Metabolic Panel: Recent Labs  Lab 12/19/17 0825 12/20/17 0533 12/21/17 0438  NA 138 138 138  K 4.3 4.2 4.0  CL 111 111 113*  CO2 19* 17* 19*  GLUCOSE 184* 185* 144*  BUN 28* 17 12  CREATININE 1.41* 1.25* 1.28*  CALCIUM 8.3* 8.6* 8.1*   GFR: Estimated Creatinine Clearance: 75.9 mL/min (A) (by C-G formula based on SCr of 1.28 mg/dL (H)).  Liver Function Tests: Recent Labs  Lab 12/21/17 0438  AST 11*  ALT 12  ALKPHOS 46  BILITOT 0.6  PROT 5.4*  ALBUMIN 3.2*    Coagulation Profile: Recent Labs  Lab 12/19/17 0825  INR 1.51    Cardiac Enzymes: Recent Labs  Lab 12/19/17 1442 12/19/17 2145 12/20/17 0533  TROPONINI <0.03 <0.03 <0.03    HbA1C: No results found for: HGBA1C  CBG: Recent Labs  Lab 12/20/17 1146 12/20/17 1641 12/20/17 2114 12/21/17 0743 12/21/17 1212  GLUCAP 183* 208* 171* 137* 144*    Recent Results (from the past 240 hour(s))  MRSA PCR Screening     Status: None   Collection Time: 12/19/17  2:35 PM  Result Value Ref Range Status   MRSA by PCR NEGATIVE NEGATIVE Final    Comment:        The GeneXpert MRSA Assay  (FDA approved for NASAL specimens only), is one component of a comprehensive MRSA colonization surveillance program. It is not intended to diagnose MRSA infection nor to guide or monitor treatment for MRSA infections. Performed at [facility], [address], [place], [place] [address]      Scheduled Meds: . aspirin EC  81 mg Oral Daily  . atorvastatin  80 mg Oral q1800  . carvedilol  3.125 mg Oral BID WC  . docusate sodium  100 mg Oral BID  . finasteride  5 mg Oral Daily  . insulin aspart  0-15 Units Subcutaneous TID WC  . insulin aspart  0-5 Units Subcutaneous QHS  . insulin glargine  21 Units Subcutaneous QHS  . pantoprazole  40 mg Oral Daily  . ranolazine  500 mg Oral BID  . tamsulosin  0.4 mg Oral Daily   Continuous Infusions: . sodium chloride Stopped (12/21/17 0620)  . heparin Stopped (12/21/17 1317)  . nitroGLYCERIN Stopped (12/21/17 1430)     LOS: 1 day   Lonia Blood, MD [facility] Office  [phone] Pager - Text Page per Amion as per below:  On-Call/Text Page:      [email or web address]      password [email or web address]  If 7PM-7AM, please contact night-coverage [email or web address] Password [email or web address] 12/21/2017, 2:58 PM

## 2017-12-22 DIAGNOSIS — I259 Chronic ischemic heart disease, unspecified: Secondary | ICD-10-CM

## 2017-12-22 LAB — LIPID PANEL
CHOL/HDL RATIO: 4.1 ratio
CHOLESTEROL: 144 mg/dL (ref 0–200)
HDL: 35 mg/dL — ABNORMAL LOW (ref >40)
LDL CALC: 79 mg/dL (ref 0–99)
Triglycerides: 148 mg/dL (ref <150)
VLDL: 30 mg/dL (ref 0–40)

## 2017-12-22 LAB — BASIC METABOLIC PANEL
Anion gap: 10 (ref 5–15)
BUN: 9 mg/dL (ref 8–23)
CHLORIDE: 110 mmol/L (ref 98–111)
CO2: 19 mmol/L — AB (ref 22–32)
Calcium: 8.5 mg/dL — ABNORMAL LOW (ref 8.9–10.3)
Creatinine, Ser: 1.24 mg/dL (ref 0.61–1.24)
GFR calc Af Amer: >60 mL/min (ref >60)
GFR calc non Af Amer: >60 mL/min (ref >60)
GLUCOSE: 140 mg/dL — AB (ref 70–99)
Potassium: 3.8 mmol/L (ref 3.5–5.1)
SODIUM: 139 mmol/L (ref 135–145)

## 2017-12-22 LAB — CBC
HEMATOCRIT: 36.3 % — AB (ref 39.0–52.0)
HEMOGLOBIN: 12.1 g/dL — AB (ref 13.0–17.0)
MCH: 31.3 pg (ref 26.0–34.0)
MCHC: 33.3 g/dL (ref 30.0–36.0)
MCV: 94 fL (ref 78.0–100.0)
Platelets: 243 10*3/uL (ref 150–400)
RBC: 3.86 MIL/uL — ABNORMAL LOW (ref 4.22–5.81)
RDW: 13.4 % (ref 11.5–15.5)
WBC: 6.5 10*3/uL (ref 4.0–10.5)

## 2017-12-22 LAB — GLUCOSE, CAPILLARY
GLUCOSE-CAPILLARY: 174 mg/dL — AB (ref 70–99)
Glucose-Capillary: 139 mg/dL — ABNORMAL HIGH (ref 70–99)

## 2017-12-22 LAB — APTT: aPTT: 30 seconds (ref 24–36)

## 2017-12-22 MED ORDER — RANOLAZINE ER 500 MG PO TB12: 1000.0000 mg | ORAL_TABLET | Freq: Two times a day (BID) | ORAL | Status: DC

## 2017-12-22 MED ORDER — ISOSORBIDE MONONITRATE ER 30 MG PO TB24: 30.0000 mg | ORAL_TABLET | Freq: Every day | ORAL | 0 refills | Status: DC

## 2017-12-22 MED ORDER — RANOLAZINE ER 1000 MG PO TB12: 1000.0000 mg | ORAL_TABLET | Freq: Two times a day (BID) | ORAL | 0 refills | Status: AC

## 2017-12-22 MED ORDER — ACETAMINOPHEN 325 MG PO TABS: 650.0000 mg | ORAL_TABLET | ORAL | Status: AC | PRN

## 2017-12-22 MED ORDER — CARVEDILOL 6.25 MG PO TABS: 6.2500 mg | ORAL_TABLET | Freq: Two times a day (BID) | ORAL | 0 refills | Status: DC

## 2017-12-22 MED FILL — Heparin Sod (Porcine)-NaCl IV Soln 1000 Unit/500ML-0.9%: INTRAVENOUS | Qty: 500 | Status: AC

## 2017-12-22 NOTE — Care Management (Addendum)
  3:50pm Update: [facility] after [facility] at facility with prison guard.  CM was informed that the guard misplaced discharge paper work given to him by the bedside nurse.  Prison requested CM to fax discharge summary  to [phone].  CM contacted medical records to inquire about the process and was informed that the prison would need to fax the below request; [facility] Records STAT Business Name Address, phone and fax Pts name DOB Date of Discharge Requested information (Discharge summary) CM contacted RN Hurdle and informed of process - RN Informed CM that she would fax request over now.  Medical Records aware that STAT request will be received soon and will process request today.  CM requested Prison RN to contact CM if barriers are met regarding obtaining the information today  11:14am CM informed by bedside nurse that per Dr Thereasa Solo pt will discharge back to [facility] today.  CM contacted Nursing Supervisor Ms Randie Heinz at [facility] in [place]  [phone].  Per Ms [facility] discharge paper work (AVS) should be sent with the guard - no information requested from CM

## 2017-12-22 NOTE — Discharge Instructions (Signed)
Angina Pectoris Angina pectoris is a very bad feeling in the chest, neck, or arm. Your doctor may call it angina. There are four types of angina. Angina is caused by a lack of blood in the middle and thickest layer of the heart wall (myocardium). Angina may feel like a crushing or squeezing pain in the chest. It may feel like tightness or heavy pressure in the chest. Some people say it feels like gas, heartburn, or indigestion. Some people have symptoms other than pain. These include:  Shortness of breath.  Cold sweats.  Feeling sick to your stomach (nausea).  Feeling light-headed.  Many women have chest discomfort and some of the other symptoms. However, women often have different symptoms, such as:  Feeling tired (fatigue).  Feeling nervous for no reason.  Feeling weak for no reason.  Dizziness or fainting.  Women may have angina without any symptoms. Follow these instructions at home:  Take medicines only as told by your doctor.  Take care of other health issues as told by your doctor. These include: ? High blood pressure (hypertension). ? Diabetes.  Follow a heart-healthy diet. Your doctor can help you to choose healthy food options and make changes.  Talk to your doctor to learn more about healthy cooking methods and use them. These include: ? Roasting. ? Grilling. ? Broiling. ? Baking. ? Poaching. ? Steaming. ? Stir-frying.  Follow an exercise program approved by your doctor.  Keep a healthy weight. Lose weight as told by your doctor.  Rest when you are tired.  Learn to manage stress.  Do not use any tobacco, such as cigarettes, chewing tobacco, or electronic cigarettes. If you need help quitting, ask your doctor.  If you drink alcohol, and your doctor says it is okay, limit yourself to no more than 1 drink per day. One drink equals 12 ounces of beer, 5 ounces of wine, or 1 ounces of hard liquor.  Stop illegal drug use.  Keep all follow-up visits as told  by your doctor. This is important. Do not take these medicines unless your doctor says that you can:  Nonsteroidal anti-inflammatory drugs (NSAIDs). These include: ? Ibuprofen. ? Naproxen. ? Celecoxib.  Vitamin supplements that have vitamin A, vitamin E, or both.  Hormone therapy that contains estrogen with or without progestin.  Get help right away if:  You have pain in your chest, neck, arm, jaw, stomach, or back that: ? Lasts more than a few minutes. ? Comes back. ? Does not get better after you take medicine under your tongue (sublingual nitroglycerin).  You have any of these symptoms for no reason: ? Gas, heartburn, or indigestion. ? Sweating a lot. ? Shortness of breath or trouble breathing. ? Feeling sick to your stomach or throwing up. ? Feeling more tired than usual. ? Feeling nervous or worrying more than usual. ? Feeling weak. ? Diarrhea.  You are suddenly dizzy or light-headed.  You faint or pass out. These symptoms may be an emergency. Do not wait to see if the symptoms will go away. Get medical help right away. Call your local emergency services (911 in the [place] Do not drive yourself to the hospital. This information is not intended to replace advice given to you by your health care provider. Make sure you discuss any questions you have with your health care provider. Document Released: 11/19/2007 Document Revised: 11/08/2015 Document Reviewed: 10/04/2013 Elsevier Interactive Patient Education  2017 [employer]  

## 2017-12-22 NOTE — Discharge Summary (Signed)
DISCHARGE SUMMARY  Kevin Ballard  MR#: [id number]  DOB:03/30/1956  Date of Admission: 12/19/2017 Date of Discharge: 12/22/2017  Attending Physician:Carey Johndrow Silvestre Gunner, MD  Patient's WUJ:[id number], No Pcp Per  Consults:  [facility]   Disposition: D/C home   Follow-up Appts: Follow-up Information    [place], Peter M, MD. Schedule an appointment as soon as possible for a visit in 2 week(s).   Specialty:  Cardiology Contact information: [address] [place] [address] [phone]           Tests Needing Follow-up: -assess BP and determine if safe to resume low dose ACEi -assess renal function and determine if safe to resume low dose ACEi -assess CBG control   Discharge Diagnoses: Chest pain CAD s/p CABG and subsequent stenting  Acute kidney injury v/s CKD Parox Afib  DM HLD BPH Obesity - Body mass index is 39.36 kg/m.  Initial presentation: 62 y.o.M w/ a hx ofHTN, DM, CAD s/p CABG approximately 3 years ago with stent placement 2 years ago who presented w/ classic anginal type chest pain on exertion for a week that resolves with nitroglycerin.   Hospital Course:  Chest pain - hx of CAD s/p CABG and subsequent stenting  Cont medical tx - cardiac cath 7/8 noted no signif large vessel CAD but signif peripheral small vessel disease felt to likely be the cause of his chest pain - d-dimer was normal, effectively ruling out PE/DVT - his Ranexa has been increased per Cards   Acute kidney injury v/s CKD no baseline crt for reference - crt steadily improved w/ volume expansion  Recent Labs  Lab 12/19/17 0825 12/20/17 0533 12/21/17 0438 12/22/17 0527  CREATININE 1.41* 1.25* 1.28* 1.24    Parox Afib  Cont BB - transitioned from IV heparin (utlized peri-cath) back to Eliquis prior to d/c - NSR at time of d/c    DM CBG reasonably controlled - no changes made in his chronic tx regimen    HLD Lipitor at max dose - LDL 79   BPH  Obesity -  Body mass index is 39.36 kg/m.   Allergies as of 12/22/2017      Reactions   Brilinta [ticagrelor]       Medication List    STOP taking these medications   enalapril 10 MG tablet Commonly known as:  VASOTEC     TAKE these medications   acetaminophen 325 MG tablet Commonly known as:  TYLENOL Take 2 tablets (650 mg total) by mouth every 4 (four) hours as needed for headache or mild pain.   AMLODIPINE BESYLATE PO Take 10 mg by mouth daily.   apixaban 5 MG Tabs tablet Commonly known as:  ELIQUIS Take 5 mg by mouth 2 (two) times daily.   aspirin EC 81 MG tablet Take 81 mg by mouth daily.   atorvastatin 80 MG tablet Commonly known as:  LIPITOR Take 80 mg by mouth daily at 6 PM.   carvedilol 6.25 MG tablet Commonly known as:  COREG Take 1 tablet (6.25 mg total) by mouth 2 (two) times daily with a meal. What changed:    medication strength  how much to take   docusate sodium 100 MG capsule Commonly known as:  COLACE Take 100 mg by mouth 2 (two) times daily.   finasteride 5 MG tablet Commonly known as:  PROSCAR Take 5 mg by mouth daily.   insulin glargine 100 UNIT/ML injection Commonly known as:  LANTUS Inject 21 Units into the skin at  bedtime.   isosorbide mononitrate 30 MG 24 hr tablet Commonly known as:  IMDUR Take 1 tablet (30 mg total) by mouth daily. Start taking on:  12/23/2017 What changed:    medication strength  how much to take   metFORMIN 1000 MG tablet Commonly known as:  GLUCOPHAGE Take 1,000 mg by mouth daily.   nitroGLYCERIN 0.3 MG SL tablet Commonly known as:  NITROSTAT Place 0.3 mg under the tongue every 5 (five) minutes as needed for chest pain.   omeprazole 20 MG capsule Commonly known as:  PRILOSEC Take 20 mg by mouth daily.   ranolazine 1000 MG SR tablet Commonly known as:  RANEXA Take 1 tablet (1,000 mg total) by mouth 2 (two) times daily. What changed:    medication strength  how much to take   tamsulosin 0.4 MG Caps  capsule Commonly known as:  FLOMAX Take 0.4 mg by mouth daily.       Day of Discharge BP 130/61 (BP Location: Left Arm)   Pulse 74   Temp 98.7 F (37.1 C) (Oral)   Resp (!) 21   Ht 5\' 9"  (1.753 m)   Wt 119.5 kg (263 lb 8 oz)   SpO2 94%   BMI 38.91 kg/m   Physical Exam: General: No acute respiratory distress Lungs: Clear to auscultation bilaterally without wheezes or crackles Cardiovascular: Regular rate and rhythm without murmur gallop or rub normal S1 and S2 Abdomen: Nontender, nondistended, soft, bowel sounds positive, no rebound, no ascites, no appreciable mass Extremities: No significant cyanosis, clubbing, or edema bilateral lower extremities  Basic Metabolic Panel: Recent Labs  Lab 12/19/17 0825 12/20/17 0533 12/21/17 0438 12/22/17 0527  NA 138 138 138 139  K 4.3 4.2 4.0 3.8  CL 111 111 113* 110  CO2 19* 17* 19* 19*  GLUCOSE 184* 185* 144* 140*  BUN 28* 17 12 9   CREATININE 1.41* 1.25* 1.28* 1.24  CALCIUM 8.3* 8.6* 8.1* 8.5*    Liver Function Tests: Recent Labs  Lab 12/21/17 0438  AST 11*  ALT 12  ALKPHOS 46  BILITOT 0.6  PROT 5.4*  ALBUMIN 3.2*   Coags: Recent Labs  Lab 12/19/17 0825  INR 1.51   CBC: Recent Labs  Lab 12/19/17 0825 12/20/17 0533 12/21/17 0438 12/22/17 0527  WBC 9.1 9.7 7.7 6.5  HGB 11.8* 13.1 11.3* 12.1*  HCT 34.8* 39.6 34.0* 36.3*  MCV 92.8 94.5 93.7 94.0  PLT 228 269 202 243    Cardiac Enzymes: Recent Labs  Lab 12/19/17 1442 12/19/17 2145 12/20/17 0533  TROPONINI <0.03 <0.03 <0.03    CBG: Recent Labs  Lab 12/21/17 0743 12/21/17 1212 12/21/17 1651 12/21/17 2202 12/22/17 0739  GLUCAP 137* 144* 227* 150* 139*    Recent Results (from the past 240 hour(s))  MRSA PCR Screening     Status: None   Collection Time: 12/19/17  2:35 PM  Result Value Ref Range Status   MRSA by PCR NEGATIVE NEGATIVE Final    Comment:        The GeneXpert MRSA Assay (FDA approved for NASAL specimens only), is one component  of a comprehensive MRSA colonization surveillance program. It is not intended to diagnose MRSA infection nor to guide or monitor treatment for MRSA infections. Performed at [facility], [address] [place], [place] [address]      Time spent in discharge (includes decision making & examination of pt): 30 minutes  12/22/2017, 11:05 AM   Kevin BloodJeffrey T. Zaiden Ludlum, MD [facility]  Office  [phone] Pager [phone]  On-Call/Text Page:      [email or web address]      password [facility]

## 2017-12-22 NOTE — Progress Notes (Signed)
Progress Note  Patient Name: Kevin Ballard Date of Encounter: 12/22/2017  Primary Cardiologist: [place]  Subjective   No chest pain, shortness of breath or palpitation.  Inpatient Medications    Scheduled Meds: . apixaban  5 mg Oral BID  . aspirin EC  81 mg Oral Daily  . atorvastatin  80 mg Oral q1800  . carvedilol  6.25 mg Oral BID WC  . docusate sodium  100 mg Oral BID  . finasteride  5 mg Oral Daily  . insulin aspart  0-15 Units Subcutaneous TID WC  . insulin aspart  0-5 Units Subcutaneous QHS  . insulin glargine  21 Units Subcutaneous QHS  . isosorbide mononitrate  30 mg Oral Daily  . pantoprazole  40 mg Oral BID AC  . ranolazine  500 mg Oral BID  . sodium chloride flush  3 mL Intravenous Q12H  . tamsulosin  0.4 mg Oral Daily   Continuous Infusions: . sodium chloride 50 mL/hr at 12/21/17 1654   PRN Meds: acetaminophen, gi cocktail, morphine injection, ondansetron (ZOFRAN) IV, sodium chloride flush   Vital Signs    Vitals:   12/21/17 1550 12/21/17 1605 12/21/17 1933 12/22/17 0427  BP: (!) 125/44 (!) 102/45 (!) 147/63 130/61  Pulse: 86 71 74 74  Resp: 20 19 (!) 21   Temp:    98.7 F (37.1 C)  TempSrc:    Oral  SpO2: 95% 97% 98% 94%  Weight:    263 lb 8 oz (119.5 kg)  Height:        Intake/Output Summary (Last 24 hours) at 12/22/2017 0827 Last data filed at 12/22/2017 0707 Gross per 24 hour  Intake 1380.83 ml  Output 5625 ml  Net -4244.17 ml   Filed Weights   12/20/17 0546 12/21/17 0527 12/22/17 0427  Weight: 265 lb 14.4 oz (120.6 kg) 266 lb 8 oz (120.9 kg) 263 lb 8 oz (119.5 kg)    Telemetry    Sinus rhythm- Personally Reviewed  ECG    No new tracing  Physical Exam   GEN: No acute distress.   Neck: No JVD Cardiac: RRR, no murmurs, rubs, or gallops.  Left radial cath site without hematoma. Respiratory: Clear to auscultation bilaterally. GI: Soft, nontender, non-distended  MS: No edema; No deformity. Neuro:  Nonfocal  Psych: Normal affect     Labs    Chemistry Recent Labs  Lab 12/20/17 0533 12/21/17 0438 12/22/17 0527  NA 138 138 139  K 4.2 4.0 3.8  CL 111 113* 110  CO2 17* 19* 19*  GLUCOSE 185* 144* 140*  BUN 17 12 9   CREATININE 1.25* 1.28* 1.24  CALCIUM 8.6* 8.1* 8.5*  PROT  --  5.4*  --   ALBUMIN  --  3.2*  --   AST  --  11*  --   ALT  --  12  --   ALKPHOS  --  46  --   BILITOT  --  0.6  --   GFRNONAA 60* 58* >60  GFRAA >60 >60 >60  ANIONGAP 10 6 10      Hematology Recent Labs  Lab 12/20/17 0533 12/21/17 0438 12/22/17 0527  WBC 9.7 7.7 6.5  RBC 4.19* 3.63* 3.86*  HGB 13.1 11.3* 12.1*  HCT 39.6 34.0* 36.3*  MCV 94.5 93.7 94.0  MCH 31.3 31.1 31.3  MCHC 33.1 33.2 33.3  RDW 12.9 12.9 13.4  PLT 269 202 243    Cardiac Enzymes Recent Labs  Lab 12/19/17 1442 12/19/17 2145 12/20/17  0533  TROPONINI <0.03 <0.03 <0.03    Recent Labs  Lab 12/19/17 0837  TROPIPOC 0.00    DDimer  Recent Labs  Lab 12/21/17 1556  DDIMER <0.27     Radiology    No results found.  Cardiac Studies   LEFT HEART CATH AND CORS/GRAFTS ANGIOGRAPHY  Conclusion     RPDA-1 lesion is 50% stenosed.  RPDA-2 lesion is 70% stenosed.  Mid RCA lesion is 35% stenosed.  Prox Cx to Mid Cx lesion is 70% stenosed.  Ost 1st Mrg lesion is 85% stenosed.  Ost 1st Diag lesion is 90% stenosed.  Prox LAD lesion is 70% stenosed.  Prox LAD to Mid LAD lesion is 100% stenosed.  Prox Graft lesion is 50% stenosed.  SVG graft was visualized by angiography and is normal in caliber.  SVG graft was visualized by angiography and is very large.  The graft exhibits no disease.  LIMA graft was visualized by angiography and is normal in caliber.  The graft exhibits no disease.  The left ventricular systolic function is normal.  LV end diastolic pressure is mildly elevated.  The left ventricular ejection fraction is 55-65% by visual estimate.   1. Severe 2 vessel obstructive CAD.     - 100% mid LAD    - 90% first  diagonal    - 70% mid LCx    - 85% OM 1    - there is a high RV marginal branch with 70% stenosis.    - 70% distal PDA 2. Patent LIMA to the LAD 3. Patent SVG to the diagonal. The SVG has a stent proximally with a focal 50% stenosis 4. Patent SVG to the OM 1 5. Normal LV function 6. Normal LVEDP  Plan: I do not see anything angiographically that would explain his prolonged resting chest pain. He does have chronic small vessel disease. His grafts are patent. Would recommend continued medical therapy. Will discontinue IV Ntg and resume po. Titrate antianginal therapy as BP allows. Can resume Eliquis in am.    Diagnostic Diagram        Patient Profile     62 y.o. male CAD s/p CABG, HLD, DM and PAFwho is being seen for the evaluation of increasing chest pain.   Assessment & Plan    1. Unable angina. - enzyme negative.  EKG without acute ischemic changes.  Cath report as above.  It showed two-vessel disease with patent LIMA to LAD, patent SVG to OM1 and patent SVG to diagonal with 50% stenosis in previously placed stent.  No culprit lesion.  Recommended medical therapy. - Continue aspirin, atorvastatin, carvedilol, Imdur and ranolazine.   2.  Paroxysmal atrial fibrillation -Maintaining sinus rhythm.  Resume Eliquis this morning.  [facility] will sign off.   Medication Recommendations: aspirin 81 mg daily, Imdur 60 mg daily, Ranexa 500 MG twice daily (can be uptitrated as outpatient), Coreg 6.25 mg twice daily (reduced from home 25mg BID due to soft BP), Lipitor 80 mg daily and enalapril 10 mg twice daily (resume when BP stable). Other recommendations (labs, testing, etc):  None Follow up as an outpatient: His primary cardiologist otherwise can [facility] with [facility] (Dr. [place]) if staying in [place] region.  For questions or updates, please contact [facility] Please consult [email or web address] for contact info under Cardiology/STEMI.      Signed, Manson PasseyBhavinkumar  Joelle [place], PA  12/22/2017, 8:27 AM

## 2018-02-18 ENCOUNTER — Emergency Department (HOSPITAL_COMMUNITY)

## 2018-02-18 ENCOUNTER — Other Ambulatory Visit: Payer: Self-pay

## 2018-02-18 ENCOUNTER — Emergency Department (HOSPITAL_COMMUNITY)
Admission: EM | Admit: 2018-02-18 | Discharge: 2018-02-19 | Disposition: A | Discharge status: Home / Self Care | Attending: Emergency Medicine | Admitting: Emergency Medicine

## 2018-02-18 ENCOUNTER — Encounter (HOSPITAL_COMMUNITY): Payer: Self-pay

## 2018-02-18 DIAGNOSIS — I10 Essential (primary) hypertension: Secondary | ICD-10-CM | POA: Diagnosis not present

## 2018-02-18 DIAGNOSIS — I2510 Atherosclerotic heart disease of native coronary artery without angina pectoris: Secondary | ICD-10-CM | POA: Diagnosis not present

## 2018-02-18 DIAGNOSIS — R079 Chest pain, unspecified: Secondary | ICD-10-CM | POA: Insufficient documentation

## 2018-02-18 DIAGNOSIS — Z79899 Other long term (current) drug therapy: Secondary | ICD-10-CM | POA: Insufficient documentation

## 2018-02-18 DIAGNOSIS — E119 Type 2 diabetes mellitus without complications: Secondary | ICD-10-CM | POA: Insufficient documentation

## 2018-02-18 DIAGNOSIS — Z794 Long term (current) use of insulin: Secondary | ICD-10-CM | POA: Insufficient documentation

## 2018-02-18 DIAGNOSIS — I4891 Unspecified atrial fibrillation: Secondary | ICD-10-CM | POA: Diagnosis not present

## 2018-02-18 DIAGNOSIS — Z7901 Long term (current) use of anticoagulants: Secondary | ICD-10-CM | POA: Diagnosis not present

## 2018-02-18 DIAGNOSIS — R072 Precordial pain: Secondary | ICD-10-CM

## 2018-02-18 LAB — BASIC METABOLIC PANEL
ANION GAP: 9 (ref 5–15)
BUN: 23 mg/dL (ref 8–23)
CALCIUM: 9 mg/dL (ref 8.9–10.3)
CO2: 23 mmol/L (ref 22–32)
CREATININE: 1.24 mg/dL (ref 0.61–1.24)
Chloride: 106 mmol/L (ref 98–111)
GLUCOSE: 145 mg/dL — AB (ref 70–99)
Potassium: 4.1 mmol/L (ref 3.5–5.1)
SODIUM: 138 mmol/L (ref 135–145)

## 2018-02-18 LAB — CBC
HCT: 39 % (ref 39.0–52.0)
Hemoglobin: 12.9 g/dL — ABNORMAL LOW (ref 13.0–17.0)
MCH: 30.6 pg (ref 26.0–34.0)
MCHC: 33.1 g/dL (ref 30.0–36.0)
MCV: 92.4 fL (ref 78.0–100.0)
Platelets: 231 K/uL (ref 150–400)
RBC: 4.22 MIL/uL (ref 4.22–5.81)
RDW: 13.5 % (ref 11.5–15.5)
WBC: 7.3 K/uL (ref 4.0–10.5)

## 2018-02-18 LAB — I-STAT TROPONIN, ED: Troponin i, poc: 0.02 ng/mL (ref 0.00–0.08)

## 2018-02-18 NOTE — ED Provider Notes (Signed)
[facility] EMERGENCY DEPARTMENT Provider Note   CSN: [id number] Arrival date & time: 02/18/18  2059     History   Chief Complaint Chief Complaint  Patient presents with  . Chest Pain    HPI Cael Skuse is a 62 y.o. male.  HPI  62 year old male, known history of atrial fibrillation as well as coronary disease and diabetes.  He has had a prior bypass graft Catheterization 2 months ago revealing that his stents were widely patent and there was no anatomical evidence for coronary cause of his chest pain at that time.  He presents again this evening with a complaint of chest pain which is mid, heaviness, radiates to the shoulder, associated with some nausea earlier.  He had this at around 1:00 in the afternoon and then went away, it came back around 6:00.  He took a nitroglycerin and then [facility], they arranged for transport to the hospital for evaluation.  The patient denies swelling of the legs, shortness of breath, fevers or coughing.  He states that his pain today feels exactly like it did 2 months ago.  He is on Eliquis for atrial fibrillation.  Past Medical History:  Diagnosis Date  . Atrial fibrillation (HCC)   . BPH (benign prostatic hyperplasia)   . Coronary artery disease   . Coronary artery disease involving coronary bypass graft   . Diabetes mellitus without complication (HCC)   . GERD (gastroesophageal reflux disease)   . Hypercholesterolemia   . Hypertension   . Myocardial infarct [facility])     Patient Active Problem List   Diagnosis Date Noted  . CAD (coronary artery disease) of bypass graft 12/19/2017  . Type 2 diabetes mellitus with other specified complication (HCC) 12/19/2017  . Unspecified atrial fibrillation (HCC) 12/19/2017  . Chronic anticoagulation 12/19/2017  . HTN (hypertension) 12/19/2017  . HLD (hyperlipidemia) 12/19/2017  . BPH (benign prostatic hyperplasia) 12/19/2017    Past Surgical History:    Procedure Laterality Date  . LEFT HEART CATH AND CORS/GRAFTS ANGIOGRAPHY N/A 12/21/2017   Procedure: LEFT HEART CATH AND CORS/GRAFTS ANGIOGRAPHY;  Surgeon: [place], Peter M, MD;  Location: [facility] INVASIVE CV LAB;  Service: Cardiovascular;  Laterality: N/A;  . LUMBAR FUSION    . MEDIAL COLLATERAL LIGAMENT REPAIR, KNEE    . TIBIAL FASCIOTOMY    . tibiastomy          Home Medications    Prior to Admission medications   Medication Sig Start Date End Date Taking? Authorizing Provider  acetaminophen (TYLENOL) 325 MG tablet Take 2 tablets (650 mg total) by mouth every 4 (four) hours as needed for headache or mild pain. 12/22/17   Lonia Blood, MD  AMLODIPINE BESYLATE PO Take 10 mg by mouth daily.    [provider]  apixaban (ELIQUIS) 5 MG TABS tablet Take 5 mg by mouth 2 (two) times daily.    [provider]  aspirin EC 81 MG tablet Take 81 mg by mouth daily.    [provider]  atorvastatin (LIPITOR) 80 MG tablet Take 80 mg by mouth daily at 6 PM.     [provider]  carvedilol (COREG) 6.25 MG tablet Take 1 tablet (6.25 mg total) by mouth 2 (two) times daily with a meal. 12/22/17   Lonia Blood, MD  docusate sodium (COLACE) 100 MG capsule Take 100 mg by mouth 2 (two) times daily.    [provider]  finasteride (PROSCAR) 5  MG tablet Take 5 mg by mouth daily.    [provider]  insulin glargine (LANTUS) 100 UNIT/ML injection Inject 21 Units into the skin at bedtime.    [provider]  isosorbide mononitrate (IMDUR) 30 MG 24 hr tablet Take 1 tablet (30 mg total) by mouth daily. 12/23/17   Lonia Blood, MD  metFORMIN (GLUCOPHAGE) 1000 MG tablet Take 1,000 mg by mouth daily.    [provider]  nitroGLYCERIN (NITROSTAT) 0.3 MG SL tablet Place 0.3 mg under the tongue every 5 (five) minutes as needed for chest pain.    [provider]  omeprazole (PRILOSEC) 20 MG capsule Take 20 mg by mouth daily.     [provider]  ranolazine (RANEXA) 1000 MG SR tablet Take 1 tablet (1,000 mg total) by mouth 2 (two) times daily. 12/22/17   Lonia Blood, MD  tamsulosin (FLOMAX) 0.4 MG CAPS capsule Take 0.4 mg by mouth daily.    [provider]    Family History Family History  Problem Relation Age of Onset  . Heart attack Mother   . Cancer Mother   . Heart attack Maternal Grandmother     Social History Social History   Tobacco Use  . Smoking status: Never Smoker  . Smokeless tobacco: Never Used  Substance Use Topics  . Alcohol use: Never    Frequency: Never  . Drug use: Never     Allergies   Brilinta [ticagrelor]   Review of Systems Review of Systems  All other systems reviewed and are negative.    Physical Exam Updated Vital Signs BP 130/64   Pulse 76   Temp 98.1 F (36.7 C) (Oral)   Resp 17   Ht 1.753 m (5\' 9" )   Wt 115.7 kg   SpO2 95%   BMI 37.66 kg/m   Physical Exam  Constitutional: He appears well-developed and well-nourished. No distress.  HENT:  Head: Normocephalic and atraumatic.  Mouth/Throat: Oropharynx is clear and moist. No oropharyngeal exudate.  Eyes: Pupils are equal, round, and reactive to light. Conjunctivae and EOM are normal. Right eye exhibits no discharge. Left eye exhibits no discharge. No scleral icterus.  Neck: Normal range of motion. Neck supple. No JVD present. No thyromegaly present.  Cardiovascular: Normal rate, regular rhythm, normal heart sounds and intact distal pulses. Exam reveals no gallop and no friction rub.  No murmur heard. Pulmonary/Chest: Effort normal and breath sounds normal. No respiratory distress. He has no wheezes. He has no rales.  Abdominal: Soft. Bowel sounds are normal. He exhibits no distension and no mass. There is no tenderness.  Musculoskeletal: Normal range of motion. He exhibits no edema or tenderness.  Lymphadenopathy:    He has no cervical adenopathy.  Neurological: He is alert.  Coordination normal.  Skin: Skin is warm and dry. No rash noted. No erythema.  Psychiatric: He has a normal mood and affect. His behavior is normal.  Nursing note and vitals reviewed.    ED Treatments / Results  Labs (all labs ordered are listed, but only abnormal results are displayed) Labs Reviewed  BASIC METABOLIC PANEL  CBC  I-STAT TROPONIN, ED    EKG EKG Interpretation  Date/Time:  Thursday February 18 2018 21:08:53 EDT Ventricular Rate:  70 PR Interval:    QRS Duration: 88 QT Interval:  417 QTC Calculation: 450 R Axis:   137 Text Interpretation:  Sinus rhythm Right axis deviation since 12/21/17, no changes Confirmed by Eber Hong ([id number]) on 02/18/2018 9:47:02  PM   Radiology No results found.  Procedures Procedures (including critical care time)  Medications Ordered in ED Medications - No data to display   Initial Impression / Assessment and Plan / ED Course  I have reviewed the triage vital signs and the nursing notes.  Pertinent labs & imaging results that were available during my care of the patient were reviewed by me and considered in my medical decision making (see chart for details).  Clinical Course as of Feb 18 2350  Thu Feb 18, 2018  2248 Troponin    [BM]    Clinical Course User Index [BM] Eber Hong, MD    The patient's exam is unremarkable, his vital signs and EKG are also unremarkable, in fact the EKG shows no acute findings, he does have an isolated Q wave in lead III and F but these are exactly the same as on a prior EKG from 2 months ago.  Will obtain a cardiac work-up including troponins and a chest x-ray though at this time the patient appears unremarkable.  At change of shift, care signed out to Dr. Bebe Shaggy to follow-up results and disposition accordingly.  Second troponin pending  Final Clinical Impressions(s) / ED Diagnoses   Final diagnoses:  None    ED Discharge Orders    None       Eber Hong, MD 02/18/18  2352

## 2018-02-18 NOTE — ED Triage Notes (Signed)
Pt in by [facility] for chest pain that started this afternoon.  Pt was given 4 baby aspirin and 2 nitro with some relief.  Pain is constant at this time 5/10.

## 2018-02-19 LAB — I-STAT TROPONIN, ED
Troponin i, poc: 0.02 ng/mL (ref 0.00–0.08)
Troponin i, poc: 0.03 ng/mL (ref 0.00–0.08)

## 2018-02-19 MED ORDER — NITROGLYCERIN 0.4 MG SL SUBL
0.4000 mg | SUBLINGUAL_TABLET | SUBLINGUAL | Status: AC | PRN
  Administered 2018-02-19 (×3): 0.4 mg via SUBLINGUAL
  Filled 2018-02-19: qty 1

## 2018-02-19 NOTE — ED Provider Notes (Signed)
I assumed care in sign out to follow-up on repeat troponin.  Repeat troponin unchanged.  Patient still reported mild chest pain, and he requests nitroglycerin.  He was given NTG and he felt improved.  I had a long discussion with the patient, including potential admission.  He declined admission, we did agree to do a third troponin.  No significant change in the troponin, patient improved, will discharge.  Again he just had a recent cardiac catheterization showed chronic small vessel disease, but grafts were patent. It was advised to do medical therapy.  He reports this feels similar to prior episodes of chest pain, denies pleuritic chest pain at this time.  Overall he is well-appearing, no acute distress, smiling and watching television.  I feel he is appropriate for discharge home.   Zadie Rhine, MD 02/19/18 [phone]

## 2019-03-12 ENCOUNTER — Emergency Department (HOSPITAL_COMMUNITY)

## 2019-03-12 ENCOUNTER — Other Ambulatory Visit: Payer: Self-pay

## 2019-03-12 ENCOUNTER — Emergency Department (HOSPITAL_COMMUNITY)
Admission: EM | Admit: 2019-03-12 | Discharge: 2019-03-12 | Attending: Emergency Medicine | Admitting: Emergency Medicine

## 2019-03-12 ENCOUNTER — Encounter (HOSPITAL_COMMUNITY): Payer: Self-pay | Admitting: Emergency Medicine

## 2019-03-12 DIAGNOSIS — Z79899 Other long term (current) drug therapy: Secondary | ICD-10-CM | POA: Diagnosis not present

## 2019-03-12 DIAGNOSIS — Z794 Long term (current) use of insulin: Secondary | ICD-10-CM | POA: Insufficient documentation

## 2019-03-12 DIAGNOSIS — U071 COVID-19: Secondary | ICD-10-CM | POA: Diagnosis not present

## 2019-03-12 DIAGNOSIS — I252 Old myocardial infarction: Secondary | ICD-10-CM | POA: Diagnosis not present

## 2019-03-12 DIAGNOSIS — I10 Essential (primary) hypertension: Secondary | ICD-10-CM | POA: Diagnosis not present

## 2019-03-12 DIAGNOSIS — I2510 Atherosclerotic heart disease of native coronary artery without angina pectoris: Secondary | ICD-10-CM | POA: Insufficient documentation

## 2019-03-12 DIAGNOSIS — R509 Fever, unspecified: Secondary | ICD-10-CM | POA: Diagnosis present

## 2019-03-12 DIAGNOSIS — R05 Cough: Secondary | ICD-10-CM | POA: Diagnosis not present

## 2019-03-12 DIAGNOSIS — Z20828 Contact with and (suspected) exposure to other viral communicable diseases: Secondary | ICD-10-CM

## 2019-03-12 DIAGNOSIS — E119 Type 2 diabetes mellitus without complications: Secondary | ICD-10-CM | POA: Diagnosis not present

## 2019-03-12 DIAGNOSIS — R079 Chest pain, unspecified: Secondary | ICD-10-CM | POA: Diagnosis not present

## 2019-03-12 DIAGNOSIS — Z7901 Long term (current) use of anticoagulants: Secondary | ICD-10-CM | POA: Insufficient documentation

## 2019-03-12 DIAGNOSIS — Z20822 Contact with and (suspected) exposure to covid-19: Secondary | ICD-10-CM

## 2019-03-12 DIAGNOSIS — Z7982 Long term (current) use of aspirin: Secondary | ICD-10-CM | POA: Diagnosis not present

## 2019-03-12 LAB — HEPATIC FUNCTION PANEL
ALT: 25 U/L (ref 0–44)
AST: 24 U/L (ref 15–41)
Albumin: 4 g/dL (ref 3.5–5.0)
Alkaline Phosphatase: 58 U/L (ref 38–126)
Bilirubin, Direct: 0.2 mg/dL (ref 0.0–0.2)
Indirect Bilirubin: 0.8 mg/dL (ref 0.3–0.9)
Total Bilirubin: 1 mg/dL (ref 0.3–1.2)
Total Protein: 6.7 g/dL (ref 6.5–8.1)

## 2019-03-12 LAB — CBC
HCT: 41.6 % (ref 39.0–52.0)
Hemoglobin: 13.6 g/dL (ref 13.0–17.0)
MCH: 30.1 pg (ref 26.0–34.0)
MCHC: 32.7 g/dL (ref 30.0–36.0)
MCV: 92 fL (ref 80.0–100.0)
Platelets: 212 10*3/uL (ref 150–400)
RBC: 4.52 MIL/uL (ref 4.22–5.81)
RDW: 13.6 % (ref 11.5–15.5)
WBC: 5.7 10*3/uL (ref 4.0–10.5)
nRBC: 0 % (ref 0.0–0.2)

## 2019-03-12 LAB — DIFFERENTIAL
Abs Immature Granulocytes: 0.02 10*3/uL (ref 0.00–0.07)
Basophils Absolute: 0 10*3/uL (ref 0.0–0.1)
Basophils Relative: 1 %
Eosinophils Absolute: 0 10*3/uL (ref 0.0–0.5)
Eosinophils Relative: 1 %
Immature Granulocytes: 0 %
Lymphocytes Relative: 20 %
Lymphs Abs: 1.1 10*3/uL (ref 0.7–4.0)
Monocytes Absolute: 0.8 10*3/uL (ref 0.1–1.0)
Monocytes Relative: 15 %
Neutro Abs: 3.7 10*3/uL (ref 1.7–7.7)
Neutrophils Relative %: 63 %

## 2019-03-12 LAB — BASIC METABOLIC PANEL
Anion gap: 12 (ref 5–15)
BUN: 20 mg/dL (ref 8–23)
CO2: 21 mmol/L — ABNORMAL LOW (ref 22–32)
Calcium: 8.7 mg/dL — ABNORMAL LOW (ref 8.9–10.3)
Chloride: 104 mmol/L (ref 98–111)
Creatinine, Ser: 1.3 mg/dL — ABNORMAL HIGH (ref 0.61–1.24)
GFR calc Af Amer: >60 mL/min (ref >60)
GFR calc non Af Amer: 58 mL/min — ABNORMAL LOW (ref >60)
Glucose, Bld: 202 mg/dL — ABNORMAL HIGH (ref 70–99)
Potassium: 4 mmol/L (ref 3.5–5.1)
Sodium: 137 mmol/L (ref 135–145)

## 2019-03-12 LAB — SARS CORONAVIRUS 2 BY RT PCR (HOSPITAL ORDER, PERFORMED IN ~~LOC~~ HOSPITAL LAB): SARS Coronavirus 2: POSITIVE — AB

## 2019-03-12 LAB — TROPONIN I (HIGH SENSITIVITY)
Troponin I (High Sensitivity): 8 ng/L (ref <18)
Troponin I (High Sensitivity): 8 ng/L (ref <18)

## 2019-03-12 MED ORDER — SODIUM CHLORIDE 0.9% FLUSH: 3.0000 mL | Freq: Once | INTRAVENOUS | Status: DC

## 2019-03-12 MED ORDER — KETOROLAC TROMETHAMINE 30 MG/ML IJ SOLN
15.0000 mg | Freq: Once | INTRAMUSCULAR | Status: AC
  Administered 2019-03-12: 15 mg via INTRAVENOUS
  Filled 2019-03-12: qty 1

## 2019-03-12 MED ORDER — MORPHINE SULFATE (PF) 4 MG/ML IV SOLN
4.0000 mg | Freq: Once | INTRAVENOUS | Status: DC
  Filled 2019-03-12: qty 1

## 2019-03-12 MED ORDER — NITROGLYCERIN 2 % TD OINT
1.0000 [in_us] | TOPICAL_OINTMENT | Freq: Once | TRANSDERMAL | Status: DC
  Filled 2019-03-12: qty 1

## 2019-03-12 NOTE — ED Notes (Signed)
18 gauge IV to right AC removed- cath intact- bandaid applied

## 2019-03-12 NOTE — ED Notes (Addendum)
Attempted to call triage nurse back five separate times with no answer

## 2019-03-12 NOTE — ED Notes (Signed)
EMS reports pt had fever 100.4 last night.  Reports chest pain since last night.   EMS administered 163mcg fentanyl and 1in nitro.

## 2019-03-12 NOTE — ED Notes (Signed)
Spoke with triage nurse again, kept repeating to give the phone to the doctor, attempted to explain that the doctor has his own telephone with separate phone number, triage nurse kept repeating the doctor was ready to talk to EDP, informed triage nurse that I would call this number back from another phone so I could transfer to the doctor's phone. This call ended. Called triage nurse number back and transferred to Dr Roderic Palau.  Dr Roderic Palau reports that he was on hold for several minutes and no one picked up the phone. Dr Roderic Palau told this nurse if someone needs to talk to him, he will be glad to talk with them, but cannot wait on hold. Informed officer of these events.

## 2019-03-12 NOTE — Discharge Instructions (Addendum)
Continue with Tylenol for fever and pain.  Drink plenty of fluids and follow-up next week for recheck with your provider

## 2019-03-12 NOTE — ED Notes (Signed)
Officer Teola Bradley is calling his supervisor to inform him of these events, as pt has been discharged and report has been given. Officer Teola Bradley also states that the prison [facility] has an isolation cell set up for pt when he returns.

## 2019-03-12 NOTE — ED Triage Notes (Signed)
Pt has a Covid pt on [facility]  CP since last night   Shortness of breath since this morning  NTG x 3 without relief  SR on monitor on the way in

## 2019-03-12 NOTE — ED Provider Notes (Signed)
[facility] EMERGENCY DEPARTMENT Provider Note   CSN: [id number] Arrival date & time: 03/12/19  1518     History   Chief Complaint Chief Complaint  Patient presents with   Chest Pain    HPI Kevin Ballard is a 63 y.o. male.     Patient complains of fever mild cough and some chest discomfort  The history is provided by the patient. No language interpreter was used.  Chest Pain Pain location:  Substernal area Pain quality: aching   Pain radiates to:  Does not radiate Pain severity:  Mild Onset quality:  Sudden Timing:  Constant Progression:  Worsening Chronicity:  New Context: not breathing   Relieved by:  Nothing Worsened by:  Nothing Ineffective treatments:  None tried Associated symptoms: no abdominal pain, no back pain, no cough, no fatigue and no headache     Past Medical History:  Diagnosis Date   Atrial fibrillation (HCC)    BPH (benign prostatic hyperplasia)    Coronary artery disease    Coronary artery disease involving coronary bypass graft    Diabetes mellitus without complication (HCC)    GERD (gastroesophageal reflux disease)    Hypercholesterolemia    Hypertension    Myocardial infarct [facility])     Patient Active Problem List   Diagnosis Date Noted   CAD (coronary artery disease) of bypass graft 12/19/2017   Type 2 diabetes mellitus with other specified complication (HCC) 12/19/2017   Unspecified atrial fibrillation (HCC) 12/19/2017   Chronic anticoagulation 12/19/2017   HTN (hypertension) 12/19/2017   HLD (hyperlipidemia) 12/19/2017   BPH (benign prostatic hyperplasia) 12/19/2017    Past Surgical History:  Procedure Laterality Date   LEFT HEART CATH AND CORS/GRAFTS ANGIOGRAPHY N/A 12/21/2017   Procedure: LEFT HEART CATH AND CORS/GRAFTS ANGIOGRAPHY;  Surgeon: [place], Peter M, MD;  Location: [facility] INVASIVE CV LAB;  Service: Cardiovascular;  Laterality: N/A;   LUMBAR FUSION     MEDIAL COLLATERAL LIGAMENT REPAIR, KNEE     TIBIAL  FASCIOTOMY     tibiastomy          Home Medications    Prior to Admission medications   Medication Sig Start Date End Date Taking? Authorizing Provider  acetaminophen (TYLENOL) 325 MG tablet Take 2 tablets (650 mg total) by mouth every 4 (four) hours as needed for headache or mild pain. 12/22/17   Lonia Blood, MD  AMLODIPINE BESYLATE PO Take 10 mg by mouth daily.    [provider]  apixaban (ELIQUIS) 5 MG TABS tablet Take 5 mg by mouth 2 (two) times daily.    [provider]  aspirin EC 81 MG tablet Take 81 mg by mouth daily.    [provider]  atorvastatin (LIPITOR) 80 MG tablet Take 80 mg by mouth daily at 6 PM.     [provider]  carvedilol (COREG) 6.25 MG tablet Take 1 tablet (6.25 mg total) by mouth 2 (two) times daily with a meal. 12/22/17   Lonia Blood, MD  docusate sodium (COLACE) 100 MG capsule Take 100 mg by mouth 2 (two) times daily.    [provider]  finasteride (PROSCAR) 5 MG tablet Take 5 mg by mouth daily.    [provider]  insulin glargine (LANTUS) 100 UNIT/ML injection Inject 21 Units into the skin at bedtime.    [provider]  isosorbide mononitrate (IMDUR) 30 MG 24 hr tablet Take 1 tablet (30 mg total) by mouth daily. 12/23/17   Lonia Blood, MD  metFORMIN (GLUCOPHAGE) 1000 MG tablet Take 1,000 mg by mouth daily.    [provider]  nitroGLYCERIN (NITROSTAT) 0.3 MG SL tablet Place 0.3 mg under the tongue every 5 (five) minutes as needed for chest pain.    [provider]  omeprazole (PRILOSEC) 20 MG capsule Take 20 mg by mouth daily.    [provider]  ranolazine (RANEXA) 1000 MG SR tablet Take 1 tablet (1,000 mg total) by mouth 2 (two) times daily. 12/22/17   Cherene Altes, MD  tamsulosin (FLOMAX) 0.4 MG CAPS capsule Take 0.4 mg by mouth daily.    [provider]    Family History Family History  Problem Relation Age of Onset   Heart  attack Mother    Cancer Mother    Heart attack Maternal Grandmother     Social History Social History   Tobacco Use   Smoking status: Never Smoker   Smokeless tobacco: Never Used  Substance Use Topics   Alcohol use: Never    Frequency: Never   Drug use: Never     Allergies   Brilinta [ticagrelor]   Review of Systems Review of Systems  Constitutional: Negative for appetite change and fatigue.  HENT: Negative for congestion, ear discharge and sinus pressure.   Eyes: Negative for discharge.  Respiratory: Negative for cough.   Cardiovascular: Positive for chest pain.  Gastrointestinal: Negative for abdominal pain and diarrhea.  Genitourinary: Negative for frequency and hematuria.  Musculoskeletal: Negative for back pain.  Skin: Negative for rash.  Neurological: Negative for seizures and headaches.  Psychiatric/Behavioral: Negative for hallucinations.     Physical Exam Updated Vital Signs BP 110/63    Pulse 68    Temp 98.6 F (37 C) (Oral)    Resp 14    Ht 5\' 9"  (1.753 m)    Wt 113.4 kg    SpO2 100%    BMI 36.92 kg/m   Physical Exam Vitals signs and nursing note reviewed.  Constitutional:      Appearance: He is well-developed.  HENT:     Head: Normocephalic.     Nose: Nose normal.  Eyes:     General: No scleral icterus.    Conjunctiva/sclera: Conjunctivae normal.  Neck:     Musculoskeletal: Neck supple.     Thyroid: No thyromegaly.  Cardiovascular:     Rate and Rhythm: Normal rate and regular rhythm.     Heart sounds: No murmur. No friction rub. No gallop.   Pulmonary:     Breath sounds: No stridor. No wheezing or rales.  Chest:     Chest wall: No tenderness.  Abdominal:     General: There is no distension.     Tenderness: There is no abdominal tenderness. There is no rebound.  Musculoskeletal: Normal range of motion.  Lymphadenopathy:     Cervical: No cervical adenopathy.  Skin:    Findings: No erythema or rash.  Neurological:     Mental  Status: He is oriented to person, place, and time.     Motor: No abnormal muscle tone.     Coordination: Coordination normal.  Psychiatric:        Behavior: Behavior normal.      ED Treatments / Results  Labs (all labs ordered are listed, but only abnormal results are displayed) Labs Reviewed  SARS CORONAVIRUS 2 ([place] [facility]) - Abnormal; Notable for the following components:      Result Value   SARS Coronavirus 2 POSITIVE (*)  All other components within normal limits  BASIC METABOLIC PANEL - Abnormal; Notable for the following components:   CO2 21 (*)    Glucose, Bld 202 (*)    Creatinine, Ser 1.30 (*)    Calcium 8.7 (*)    GFR calc non Af Amer 58 (*)    All other components within normal limits  CBC  HEPATIC FUNCTION PANEL  DIFFERENTIAL  TROPONIN I (HIGH SENSITIVITY)  TROPONIN I (HIGH SENSITIVITY)    EKG EKG Interpretation  Date/Time:  Saturday March 12 2019 15:25:58 EDT Ventricular Rate:  78 PR Interval:    QRS Duration: 93 QT Interval:  397 QTC Calculation: 453 R Axis:   135 Text Interpretation:  Sinus rhythm Left posterior fascicular block Abnormal R-wave progression, late transition Confirmed by Bethann BerkshireZammit, Halcyon Heck [phone]([id number]) on 03/12/2019 7:46:43 PM   Radiology Dg Chest Port 1 View  Result Date: 03/12/2019 CLINICAL DATA:  Nurse notes:Pt [facility] Covid [facility] since last night Shortness of breath since this morning NTG x 3 without relief SR on monitor on the way in EMS reports pt had fever 100.4 last night. Reports chest pai.*comment was truncated*chest pain covid exposure EXAM: PORTABLE CHEST 1 VIEW COMPARISON:  Radiograph 02/18/2018 FINDINGS: Sternotomy wires overlie normal cardiac silhouette. Normal pulmonary vasculature. No effusion, infiltrate, or pneumothorax. No acute osseous abnormality. IMPRESSION: No acute cardiopulmonary process. Electronically Signed   By: Genevive BiStewart  Edmunds M.D.   On: 03/12/2019 16:48     Procedures Procedures (including critical care time)  Medications Ordered in ED Medications  ketorolac (TORADOL) 30 MG/ML injection 15 mg (15 mg Intravenous Given 03/12/19 1818)     Initial Impression / Assessment and Plan / ED Course  I have reviewed the triage vital signs and the nursing notes.  Pertinent labs & imaging results that were available during my care of the patient were reviewed by me and considered in my medical decision making (see chart for details).        .edcovid Kevin Ballard was evaluated in Emergency Department on 03/12/2019 for the symptoms described in the history of present illness. He was evaluated in the context of the global COVID-19 pandemic, which necessitated consideration that the patient might be at risk for infection with the SARS-CoV-2 virus that causes COVID-19. Institutional protocols and algorithms that pertain to the evaluation of patients at risk for COVID-19 are in a state of rapid change based on information released by regulatory bodies including the CDC and federal and state organizations. These policies and algorithms were followed during the patient's care in the ED. Patient with COVID-19 infection.  Patient is not hypoxic and does not need admission to the hospital.  Patient also has some chest discomfort that is atypical and not cardiac.  All tests are negative.  He will follow-up  Final Clinical Impressions(s) / ED Diagnoses   Final diagnoses:  COVID-19    ED Discharge Orders    None       Bethann BerkshireZammit, Isabelly Kobler, MD 03/12/19 1958

## 2019-03-12 NOTE — ED Notes (Signed)
Date and time results received: 03/12/19 5:46 PM  (use smartphrase ".now" to insert current time)  Test: Covid Critical Value: positive  Name of Provider Notified: Zammit  Orders Received? Or Actions Taken?: Orders Received - See Orders for details

## 2019-03-12 NOTE — ED Provider Notes (Signed)
I was called from someone who wanted to ask a question about the patient care.  And I was just placed on hold after 2 minutes and I hung up   Milton Ferguson, MD 03/12/19 2042

## 2019-03-17 ENCOUNTER — Emergency Department (HOSPITAL_COMMUNITY)
Admission: EM | Admit: 2019-03-17 | Discharge: 2019-03-18 | Disposition: A | Discharge status: Home / Self Care | Attending: Emergency Medicine | Admitting: Emergency Medicine

## 2019-03-17 ENCOUNTER — Other Ambulatory Visit: Payer: Self-pay

## 2019-03-17 ENCOUNTER — Encounter (HOSPITAL_COMMUNITY): Payer: Self-pay

## 2019-03-17 DIAGNOSIS — E119 Type 2 diabetes mellitus without complications: Secondary | ICD-10-CM | POA: Diagnosis not present

## 2019-03-17 DIAGNOSIS — U071 COVID-19: Secondary | ICD-10-CM | POA: Insufficient documentation

## 2019-03-17 DIAGNOSIS — I2510 Atherosclerotic heart disease of native coronary artery without angina pectoris: Secondary | ICD-10-CM | POA: Insufficient documentation

## 2019-03-17 DIAGNOSIS — I10 Essential (primary) hypertension: Secondary | ICD-10-CM | POA: Insufficient documentation

## 2019-03-17 DIAGNOSIS — R05 Cough: Secondary | ICD-10-CM | POA: Diagnosis present

## 2019-03-17 NOTE — ED Provider Notes (Signed)
[facility]-EMERGENCY DEPT [facility] Emergency Department Provider Note MRN:  [id number]  Arrival date & time: 03/17/19     Chief Complaint    covid positive   History of Present Illness   Kevin Ballard is a 63 y.o. year-old male with a history of CAD, diabetes presenting to the ED with chief complaint of COVID positive.  Patient being sent from jail for COVID symptoms.  He has known COVID-19, was seen in the emergency department about 4 or 5 days ago and was fully evaluated, then discharged.  He explains that his symptoms are the same.  Continues to have cough, dull frontal headache, body aches, lack of taste.  Has had a soreness to his central chest since Saturday, constant, unchanged.  Explains that he is not sure why he was transported here.  He was evaluated by a nurse who did not like how his lungs sounded and so transport was scheduled.  Review of Systems  A complete 10 system review of systems was obtained and all systems are negative except as noted in the HPI and PMH.   Patient's Health History    Past Medical History:  Diagnosis Date  . Atrial fibrillation (HCC)   . BPH (benign prostatic hyperplasia)   . Coronary artery disease   . Coronary artery disease involving coronary bypass graft   . Diabetes mellitus without complication (HCC)   . GERD (gastroesophageal reflux disease)   . Hypercholesterolemia   . Hypertension   . Myocardial infarct [facility])     Past Surgical History:  Procedure Laterality Date  . LEFT HEART CATH AND CORS/GRAFTS ANGIOGRAPHY N/A 12/21/2017   Procedure: LEFT HEART CATH AND CORS/GRAFTS ANGIOGRAPHY;  Surgeon: [place], Peter M, MD;  Location: [facility] INVASIVE CV LAB;  Service: Cardiovascular;  Laterality: N/A;  . LUMBAR FUSION    . MEDIAL COLLATERAL LIGAMENT REPAIR, KNEE    . TIBIAL FASCIOTOMY    . tibiastomy      Family History  Problem Relation Age of Onset  . Heart attack Mother   . Cancer Mother   . Heart attack Maternal Grandmother     Social  History   Socioeconomic History  . Marital status: Single    Spouse name: Not on file  . Number of children: Not on file  . Years of education: Not on file  . Highest education level: Not on file  Occupational History  . Not on file  Social Needs  . Financial resource strain: Not on file  . Food insecurity    Worry: Not on file    Inability: Not on file  . Transportation needs    Medical: Not on file    Non-medical: Not on file  Tobacco Use  . Smoking status: Never Smoker  . Smokeless tobacco: Never Used  Substance and Sexual Activity  . Alcohol use: Never    Frequency: Never  . Drug use: Never  . Sexual activity: Not on file  Lifestyle  . Physical activity    Days per week: Not on file    Minutes per session: Not on file  . Stress: Not on file  Relationships  . Social Musician on phone: Not on file    Gets together: Not on file    Attends religious service: Not on file    Active member of club or organization: Not on file    Attends meetings of clubs or organizations: Not on file    Relationship status: Not on file  .  Intimate partner violence    Fear of current or ex partner: Not on file    Emotionally abused: Not on file    Physically abused: Not on file    Forced sexual activity: Not on file  Other Topics Concern  . Not on file  Social History Narrative  . Not on file     Physical Exam  Vital Signs and Nursing Notes reviewed Vitals:   03/17/19 2232  BP: (!) 159/80  Pulse: 93  Resp: 18  Temp: 99.5 F (37.5 C)  SpO2: 99%    CONSTITUTIONAL: Well-appearing, NAD NEURO:  Alert and oriented x 3, no focal deficits EYES:  eyes equal and reactive ENT/NECK:  no LAD, no JVD CARDIO: Regular rate, well-perfused, normal S1 and S2 PULM:  CTAB no wheezing or rhonchi, not tachypneic, no increased work of breathing GI/GU:  normal bowel sounds, non-distended, non-tender MSK/SPINE:  No gross deformities, no edema SKIN:  no rash, atraumatic PSYCH:   Appropriate speech and behavior  Diagnostic and Interventional Summary    EKG Interpretation  Date/Time:  Thursday March 17 2019 22:46:03 EDT Ventricular Rate:  101 PR Interval:  166 QRS Duration: 84 QT Interval:  358 QTC Calculation: 464 R Axis:   147 Text Interpretation:  Sinus tachycardia Possible Lateral infarct , age undetermined Possible Inferior infarct , age undetermined Abnormal ECG Confirmed by Kennis CarinaBero, Salim Forero [phone]) on 03/17/2019 10:50:20 PM      Labs Reviewed - No data to display  No orders to display    Medications - No data to display   Procedures Critical Care  ED Course and Medical Decision Making  I have reviewed the triage vital signs and the nursing notes.  Pertinent labs & imaging results that were available during my care of the patient were reviewed by me and considered in my medical decision making (see below for details).  COVID-19, continued symptoms, which appear to be mild.  Patient has normal vital signs, no hypoxia, no increased work of breathing, largely clear lungs with few rhonchi in the bases, to be expected.  He has continued soreness in the chest that is constant for 1 week, will screen with EKG to ensure no ischemic changes.  Otherwise there is no indication for further testing or admission, appropriate for discharge with return precautions.  EKG is without ischemic changes, patient is appropriate for discharge.  Kevin Ballard was evaluated in Emergency Department on 03/17/2019 for the symptoms described in the history of present illness. He was evaluated in the context of the global COVID-19 pandemic, which necessitated consideration that the patient might be at risk for infection with the SARS-CoV-2 virus that causes COVID-19. Institutional protocols and algorithms that pertain to the evaluation of patients at risk for COVID-19 are in a state of rapid change based on information released by regulatory bodies including the CDC and federal and state  organizations. These policies and algorithms were followed during the patient's care in the ED.   Elmer SowMichael M. Pilar PlateBero, MD [facility] [email or web address]  Final Clinical Impressions(s) / ED Diagnoses     ICD-10-CM   1. COVID-19  U07.1     ED Discharge Orders    None      Discharge Instructions Discussed with and Provided to Patient:   Discharge Instructions     You were evaluated in the Emergency Department and after careful evaluation, we did not find any emergent condition requiring admission or further testing in the hospital.  Your  exam/testing today is overall reassuring.  As discussed, we expect your symptoms to linger for several more days or even a few weeks.  Please return to the Emergency Department if you experience any worsening of your condition.  We encourage you to follow up with a primary care provider.  Thank you for allowing [place] to be a part of your care.       Maudie Flakes, MD 03/17/19 2251

## 2019-03-17 NOTE — ED Notes (Signed)
Pt did not sign due to infectious process but verbalized understanding of discharge papers, also spoke with Tammi Klippel, triage nurse and Dr Sedonia Small is talking with [facility] for pt .

## 2019-03-17 NOTE — ED Triage Notes (Signed)
Pt is covid positive, has cough and low grade fever, headache and body aches, with some squeezing sensation in chest, no taste or smell. Pt says he has felt like this since Saturday when he was seen here. No changes.  PT says he "did not know why they sent him here, nurse listened to lungs but did not tell me anything"

## 2019-03-17 NOTE — Discharge Instructions (Addendum)
You were evaluated in the Emergency Department and after careful evaluation, we did not find any emergent condition requiring admission or further testing in the hospital.  Your exam/testing today is overall reassuring.  As discussed, we expect your symptoms to linger for several more days or even a few weeks.  Please return to the Emergency Department if you experience any worsening of your condition.  We encourage you to follow up with a primary care provider.  Thank you for allowing [place] to be a part of your care.

## 2019-03-17 NOTE — ED Notes (Addendum)
Dr Sedonia Small has seen and evaluated pt in triage

## 2019-03-17 NOTE — ED Notes (Signed)
Pt is sitting behind triage a/w discharge papers- this nurse is calling triage nurse at the following number [phone] to give discharge instructions, so pt can be sent back to facility.

## 2019-03-22 ENCOUNTER — Encounter (HOSPITAL_COMMUNITY): Payer: Self-pay | Admitting: *Deleted

## 2019-03-22 ENCOUNTER — Emergency Department (HOSPITAL_COMMUNITY): Payer: Medicaid Other

## 2019-03-22 ENCOUNTER — Inpatient Hospital Stay (HOSPITAL_COMMUNITY)
Admission: EM | Admit: 2019-03-22 | Discharge: 2019-03-28 | DRG: 177 | Payer: Medicaid Other | Attending: Internal Medicine | Admitting: Internal Medicine

## 2019-03-22 ENCOUNTER — Other Ambulatory Visit: Payer: Self-pay

## 2019-03-22 DIAGNOSIS — N400 Enlarged prostate without lower urinary tract symptoms: Secondary | ICD-10-CM | POA: Diagnosis present

## 2019-03-22 DIAGNOSIS — E039 Hypothyroidism, unspecified: Secondary | ICD-10-CM | POA: Diagnosis present

## 2019-03-22 DIAGNOSIS — J9601 Acute respiratory failure with hypoxia: Secondary | ICD-10-CM | POA: Diagnosis present

## 2019-03-22 DIAGNOSIS — Z8249 Family history of ischemic heart disease and other diseases of the circulatory system: Secondary | ICD-10-CM | POA: Diagnosis not present

## 2019-03-22 DIAGNOSIS — I129 Hypertensive chronic kidney disease with stage 1 through stage 4 chronic kidney disease, or unspecified chronic kidney disease: Secondary | ICD-10-CM | POA: Diagnosis present

## 2019-03-22 DIAGNOSIS — U071 COVID-19: Secondary | ICD-10-CM | POA: Diagnosis present

## 2019-03-22 DIAGNOSIS — I252 Old myocardial infarction: Secondary | ICD-10-CM

## 2019-03-22 DIAGNOSIS — E1165 Type 2 diabetes mellitus with hyperglycemia: Secondary | ICD-10-CM | POA: Diagnosis present

## 2019-03-22 DIAGNOSIS — E1169 Type 2 diabetes mellitus with other specified complication: Secondary | ICD-10-CM | POA: Diagnosis not present

## 2019-03-22 DIAGNOSIS — Z794 Long term (current) use of insulin: Secondary | ICD-10-CM

## 2019-03-22 DIAGNOSIS — I10 Essential (primary) hypertension: Secondary | ICD-10-CM | POA: Diagnosis not present

## 2019-03-22 DIAGNOSIS — E871 Hypo-osmolality and hyponatremia: Secondary | ICD-10-CM | POA: Diagnosis present

## 2019-03-22 DIAGNOSIS — Z6836 Body mass index (BMI) 36.0-36.9, adult: Secondary | ICD-10-CM

## 2019-03-22 DIAGNOSIS — N183 Chronic kidney disease, stage 3 unspecified: Secondary | ICD-10-CM | POA: Diagnosis present

## 2019-03-22 DIAGNOSIS — E669 Obesity, unspecified: Secondary | ICD-10-CM | POA: Diagnosis present

## 2019-03-22 DIAGNOSIS — J1289 Other viral pneumonia: Secondary | ICD-10-CM | POA: Diagnosis present

## 2019-03-22 DIAGNOSIS — Z79899 Other long term (current) drug therapy: Secondary | ICD-10-CM

## 2019-03-22 DIAGNOSIS — E785 Hyperlipidemia, unspecified: Secondary | ICD-10-CM | POA: Diagnosis present

## 2019-03-22 DIAGNOSIS — Z7982 Long term (current) use of aspirin: Secondary | ICD-10-CM

## 2019-03-22 DIAGNOSIS — K219 Gastro-esophageal reflux disease without esophagitis: Secondary | ICD-10-CM | POA: Diagnosis present

## 2019-03-22 DIAGNOSIS — Z7901 Long term (current) use of anticoagulants: Secondary | ICD-10-CM

## 2019-03-22 DIAGNOSIS — I480 Paroxysmal atrial fibrillation: Secondary | ICD-10-CM | POA: Diagnosis present

## 2019-03-22 DIAGNOSIS — Z981 Arthrodesis status: Secondary | ICD-10-CM

## 2019-03-22 DIAGNOSIS — I25810 Atherosclerosis of coronary artery bypass graft(s) without angina pectoris: Secondary | ICD-10-CM | POA: Diagnosis present

## 2019-03-22 DIAGNOSIS — E1122 Type 2 diabetes mellitus with diabetic chronic kidney disease: Secondary | ICD-10-CM | POA: Diagnosis present

## 2019-03-22 DIAGNOSIS — Z79890 Hormone replacement therapy (postmenopausal): Secondary | ICD-10-CM

## 2019-03-22 DIAGNOSIS — E876 Hypokalemia: Secondary | ICD-10-CM | POA: Diagnosis present

## 2019-03-22 DIAGNOSIS — I2510 Atherosclerotic heart disease of native coronary artery without angina pectoris: Secondary | ICD-10-CM | POA: Diagnosis present

## 2019-03-22 DIAGNOSIS — I4891 Unspecified atrial fibrillation: Secondary | ICD-10-CM | POA: Diagnosis present

## 2019-03-22 DIAGNOSIS — J1282 Pneumonia due to coronavirus disease 2019: Secondary | ICD-10-CM | POA: Diagnosis present

## 2019-03-22 LAB — CBC WITH DIFFERENTIAL/PLATELET
Abs Immature Granulocytes: 0.04 10*3/uL (ref 0.00–0.07)
Basophils Absolute: 0 10*3/uL (ref 0.0–0.1)
Basophils Relative: 0 %
Eosinophils Absolute: 0 10*3/uL (ref 0.0–0.5)
Eosinophils Relative: 0 %
HCT: 38.4 % — ABNORMAL LOW (ref 39.0–52.0)
Hemoglobin: 13.1 g/dL (ref 13.0–17.0)
Immature Granulocytes: 0 %
Lymphocytes Relative: 6 %
Lymphs Abs: 0.6 10*3/uL — ABNORMAL LOW (ref 0.7–4.0)
MCH: 30.3 pg (ref 26.0–34.0)
MCHC: 34.1 g/dL (ref 30.0–36.0)
MCV: 88.7 fL (ref 80.0–100.0)
Monocytes Absolute: 0.4 10*3/uL (ref 0.1–1.0)
Monocytes Relative: 4 %
Neutro Abs: 9.1 10*3/uL — ABNORMAL HIGH (ref 1.7–7.7)
Neutrophils Relative %: 90 %
Platelets: 233 10*3/uL (ref 150–400)
RBC: 4.33 MIL/uL (ref 4.22–5.81)
RDW: 13.4 % (ref 11.5–15.5)
WBC: 10.2 10*3/uL (ref 4.0–10.5)
nRBC: 0 % (ref 0.0–0.2)

## 2019-03-22 LAB — COMPREHENSIVE METABOLIC PANEL
ALT: 16 U/L (ref 0–44)
AST: 22 U/L (ref 15–41)
Albumin: 3.5 g/dL (ref 3.5–5.0)
Alkaline Phosphatase: 54 U/L (ref 38–126)
Anion gap: 10 (ref 5–15)
BUN: 19 mg/dL (ref 8–23)
CO2: 19 mmol/L — ABNORMAL LOW (ref 22–32)
Calcium: 7.9 mg/dL — ABNORMAL LOW (ref 8.9–10.3)
Chloride: 103 mmol/L (ref 98–111)
Creatinine, Ser: 1.07 mg/dL (ref 0.61–1.24)
GFR calc Af Amer: >60 mL/min (ref >60)
GFR calc non Af Amer: >60 mL/min (ref >60)
Glucose, Bld: 187 mg/dL — ABNORMAL HIGH (ref 70–99)
Potassium: 3.2 mmol/L — ABNORMAL LOW (ref 3.5–5.1)
Sodium: 132 mmol/L — ABNORMAL LOW (ref 135–145)
Total Bilirubin: 0.8 mg/dL (ref 0.3–1.2)
Total Protein: 6.8 g/dL (ref 6.5–8.1)

## 2019-03-22 LAB — FIBRINOGEN: Fibrinogen: 574 mg/dL — ABNORMAL HIGH (ref 210–475)

## 2019-03-22 LAB — CBG MONITORING, ED: Glucose-Capillary: 183 mg/dL — ABNORMAL HIGH (ref 70–99)

## 2019-03-22 LAB — LACTIC ACID, PLASMA
Lactic Acid, Venous: 2.4 mmol/L (ref 0.5–1.9)
Lactic Acid, Venous: 1.7 mmol/L (ref 0.5–1.9)

## 2019-03-22 LAB — MAGNESIUM: Magnesium: 1.7 mg/dL (ref 1.7–2.4)

## 2019-03-22 LAB — LACTATE DEHYDROGENASE: LDH: 188 U/L (ref 98–192)

## 2019-03-22 LAB — PHOSPHORUS: Phosphorus: 1.9 mg/dL — ABNORMAL LOW (ref 2.5–4.6)

## 2019-03-22 LAB — PROCALCITONIN: Procalcitonin: <0.1 ng/mL

## 2019-03-22 LAB — FERRITIN: Ferritin: 130 ng/mL (ref 24–336)

## 2019-03-22 LAB — TRIGLYCERIDES: Triglycerides: 189 mg/dL — ABNORMAL HIGH (ref <150)

## 2019-03-22 LAB — D-DIMER, QUANTITATIVE: D-Dimer, Quant: 0.27 ug/mL-FEU (ref 0.00–0.50)

## 2019-03-22 LAB — C-REACTIVE PROTEIN: CRP: 5.3 mg/dL — ABNORMAL HIGH (ref <1.0)

## 2019-03-22 LAB — BRAIN NATRIURETIC PEPTIDE: B Natriuretic Peptide: 159 pg/mL — ABNORMAL HIGH (ref 0.0–100.0)

## 2019-03-22 MED ORDER — ALBUTEROL SULFATE HFA 108 (90 BASE) MCG/ACT IN AERS
4.0000 | INHALATION_SPRAY | Freq: Once | RESPIRATORY_TRACT | Status: AC
  Administered 2019-03-22: 4 via RESPIRATORY_TRACT
  Filled 2019-03-22: qty 6.7

## 2019-03-22 MED ORDER — PROCHLORPERAZINE EDISYLATE 10 MG/2ML IJ SOLN: 10.0000 mg | Freq: Four times a day (QID) | INTRAMUSCULAR | Status: DC | PRN

## 2019-03-22 MED ORDER — INSULIN ASPART 100 UNIT/ML ~~LOC~~ SOLN
0.0000 [IU] | Freq: Three times a day (TID) | SUBCUTANEOUS | Status: DC
  Administered 2019-03-23: 15 [IU] via SUBCUTANEOUS
  Administered 2019-03-23 – 2019-03-24 (×3): 20 [IU] via SUBCUTANEOUS
  Administered 2019-03-24: 15 [IU] via SUBCUTANEOUS
  Administered 2019-03-24 – 2019-03-25 (×2): 20 [IU] via SUBCUTANEOUS
  Administered 2019-03-25: 15 [IU] via SUBCUTANEOUS
  Administered 2019-03-25: 20 [IU] via SUBCUTANEOUS
  Administered 2019-03-26: 11 [IU] via SUBCUTANEOUS
  Administered 2019-03-26 (×2): 20 [IU] via SUBCUTANEOUS
  Administered 2019-03-27: 7 [IU] via SUBCUTANEOUS
  Administered 2019-03-27: 3 [IU] via SUBCUTANEOUS
  Administered 2019-03-27: 20 [IU] via SUBCUTANEOUS
  Administered 2019-03-28: 4 [IU] via SUBCUTANEOUS

## 2019-03-22 MED ORDER — MAGNESIUM SULFATE 2 GM/50ML IV SOLN
2.0000 g | Freq: Once | INTRAVENOUS | Status: AC
  Administered 2019-03-22: 2 g via INTRAVENOUS
  Filled 2019-03-22: qty 50

## 2019-03-22 MED ORDER — METHYLPREDNISOLONE SODIUM SUCC 125 MG IJ SOLR: 0.5000 mg/kg | Freq: Two times a day (BID) | INTRAMUSCULAR | Status: DC

## 2019-03-22 MED ORDER — ZINC SULFATE 220 (50 ZN) MG PO CAPS
220.0000 mg | ORAL_CAPSULE | Freq: Every day | ORAL | Status: DC
  Administered 2019-03-23 – 2019-03-28 (×6): 220 mg via ORAL
  Filled 2019-03-22 (×7): qty 1

## 2019-03-22 MED ORDER — HYDROCOD POLST-CPM POLST ER 10-8 MG/5ML PO SUER
5.0000 mL | Freq: Two times a day (BID) | ORAL | Status: DC | PRN
  Administered 2019-03-23: 5 mL via ORAL
  Filled 2019-03-22: qty 5

## 2019-03-22 MED ORDER — GUAIFENESIN-DM 100-10 MG/5ML PO SYRP
10.0000 mL | ORAL_SOLUTION | ORAL | Status: DC | PRN
  Administered 2019-03-24 – 2019-03-27 (×4): 10 mL via ORAL
  Filled 2019-03-22 (×4): qty 10

## 2019-03-22 MED ORDER — POTASSIUM CHLORIDE CRYS ER 20 MEQ PO TBCR
40.0000 meq | EXTENDED_RELEASE_TABLET | Freq: Once | ORAL | Status: AC
  Administered 2019-03-22: 40 meq via ORAL
  Filled 2019-03-22: qty 2

## 2019-03-22 MED ORDER — VITAMIN C 500 MG PO TABS
500.0000 mg | ORAL_TABLET | Freq: Every day | ORAL | Status: DC
  Administered 2019-03-23 – 2019-03-28 (×6): 500 mg via ORAL
  Filled 2019-03-22 (×6): qty 1

## 2019-03-22 MED ORDER — POTASSIUM CHLORIDE IN NACL 20-0.9 MEQ/L-% IV SOLN
INTRAVENOUS | Status: AC
  Administered 2019-03-22: 21:00:00 via INTRAVENOUS
  Filled 2019-03-22: qty 1000

## 2019-03-22 MED ORDER — ACETAMINOPHEN 500 MG PO TABS
1000.0000 mg | ORAL_TABLET | Freq: Once | ORAL | Status: AC
  Administered 2019-03-22: 1000 mg via ORAL
  Filled 2019-03-22: qty 2

## 2019-03-22 MED ORDER — K PHOS MONO-SOD PHOS DI & MONO 155-852-130 MG PO TABS
500.0000 mg | ORAL_TABLET | Freq: Four times a day (QID) | ORAL | Status: AC
  Administered 2019-03-23 (×3): 500 mg via ORAL
  Filled 2019-03-22 (×8): qty 2

## 2019-03-22 MED ORDER — METHYLPREDNISOLONE SODIUM SUCC 125 MG IJ SOLR
60.0000 mg | Freq: Two times a day (BID) | INTRAMUSCULAR | Status: DC
  Administered 2019-03-22: 21:00:00 60 mg via INTRAVENOUS
  Filled 2019-03-22: qty 2

## 2019-03-22 NOTE — H&P (Signed)
History and Physical    Rhythm [name]:[id number] DOB: [birth year] DOA: 03/22/2019  PCP: Patient, No Pcp Per   Patient coming from: [name] [facility]  I have personally briefly reviewed patient's old medical records in [place]  Chief Complaint: Shortness of breath and cough.  HPI: Kevin Ballard is a 63 y.o. male with medical history significant of paroxysmal atrial fibrillation, BPH, CAD, history of MI, type 2 diabetes, GERD, hyperlipidemia, hypertension who is coming due to progressively worse dyspnea after being diagnosed with COVID-19 on 03/11/2019.  He has had fatigue, malaise, headache, sore throat, low-grade fevers, chills and night sweats.  His cough is mostly dry, but is occasionally productive.  He has mild pleuritic chest pain.  He denies palpitations, dizziness, PND, orthopnea or recent pitting edema of the lower extremities.  He denies abdominal pain, but complains of decreased appetite, no nausea or emesis, diarrhea, constipation, melena or hematochezia.  Denies dysuria, frequency or hematuria.  No polyuria, polydipsia, polyphagia or blurred vision.  ED Course: His CBC shows a white count 10.2, hemoglobin 13.1 g/dL and platelets 233.  CMP shows a sodium of 132, potassium 3.2, chloride 103 and CO2 of 19 mmol/L.  Renal and hepatic function are within normal limits.  Calcium is 7.9 mg/dL.  The patient was mildly hypocalcemic on the previous measurement.  Lactic acid was initially 2.4 and then 1.7 mmol/L.  Fibrinogen was 574 and CRP 5.3 mg/dL.  Ferritin and d-dimer were normal.  Magnesium was 1.7 and phosphorus 1.9 mg/dL.  His chest radiograph does not show any acute infiltrates.  Review of Systems: As per HPI otherwise 10 point review of systems negative.   Past Medical History:  Diagnosis Date  . Atrial fibrillation ([place])   . BPH (benign prostatic hyperplasia)   . Coronary artery disease   . Coronary artery disease involving coronary bypass graft   . Diabetes mellitus without  complication ([place])   . GERD (gastroesophageal reflux disease)   . Hypercholesterolemia   . Hypertension   . Myocardial infarct [facility])     Past Surgical History:  Procedure Laterality Date  . LEFT HEART CATH AND CORS/GRAFTS ANGIOGRAPHY N/A 12/21/2017   Procedure: LEFT HEART CATH AND CORS/GRAFTS ANGIOGRAPHY;  Surgeon: Martinique, Peter M, MD;  Location: [facility];  Service: Cardiovascular;  Laterality: N/A;  . LUMBAR FUSION    . MEDIAL COLLATERAL LIGAMENT REPAIR, KNEE    . TIBIAL FASCIOTOMY    . Kevin Ballard       reports that he has never smoked. He has never used smokeless tobacco. He reports that he does not drink alcohol or use drugs.  Allergies  Allergen Reactions  . Brilinta [Ticagrelor] Shortness Of Breath    Family History  Problem Relation Age of Onset  . Heart attack Mother   . Cancer Mother   . Heart attack Maternal Grandmother    Prior to Admission medications   Medication Sig Start Date End Date Taking? Authorizing Provider  acetaminophen (TYLENOL) 325 MG tablet Take 2 tablets (650 mg total) by mouth every 4 (four) hours as needed for headache or mild pain. 12/22/17  Yes Cherene Altes, MD  amLODipine (NORVASC) 10 MG tablet Take 10 mg by mouth daily.   Yes [provider]  apixaban (ELIQUIS) 5 MG TABS tablet Take 5 mg by mouth 2 (two) times daily.   Yes [provider]  aspirin EC 81 MG tablet Take 81 mg by mouth daily.   Yes [provider]  atorvastatin (LIPITOR) 80 MG tablet Take 80 mg by mouth at bedtime.    Yes [provider]  azithromycin (ZITHROMAX) 250 MG tablet Take 250-500 mg by mouth See admin instructions. Take as directed per package instructions starting on 03/22/2019. Take 2 tablets (500mg  total) on day 1, then take 1 tablet (250mg  total) on days 2 through 5.   Yes [provider]  carvedilol (COREG) 6.25 MG tablet Take 1 tablet (6.25 mg total) by mouth 2 (two) times daily with a meal. 12/22/17  Yes Lonia BloodMcClung,  Jeffrey T, MD  docusate sodium (COLACE) 100 MG capsule Take 100 mg by mouth 2 (two) times daily.   Yes [provider]  finasteride (PROSCAR) 5 MG tablet Take 5 mg by mouth daily.   Yes [provider]  insulin glargine (LANTUS) 100 UNIT/ML injection Inject 30 Units into the skin at bedtime.    Yes [provider]  isosorbide mononitrate (IMDUR) 30 MG 24 hr tablet Take 1 tablet (30 mg total) by mouth daily. Patient taking differently: Take 30 mg by mouth at bedtime.  12/23/17  Yes Lonia BloodMcClung, Jeffrey T, MD  levothyroxine (SYNTHROID) 75 MCG tablet Take 75 mcg by mouth daily before breakfast.   Yes [provider]  metFORMIN (GLUCOPHAGE) 1000 MG tablet Take 1,000 mg by mouth daily.   Yes [provider]  methylPREDNISolone (MEDROL DOSEPAK) 4 MG TBPK tablet Take 4-24 mg by mouth See admin instructions. Take as directed per package instructions for 6 days starting on 03/22/2019. (6,5,4,3,2,1)   Yes [provider]  nitroGLYCERIN (NITROSTAT) 0.4 MG SL tablet Place 0.4 mg under the tongue every 5 (five) minutes as needed for chest pain.    Yes [provider]  omeprazole (PRILOSEC) 20 MG capsule Take 20 mg by mouth every evening.    Yes [provider]  ranolazine (RANEXA) 1000 MG SR tablet Take 1 tablet (1,000 mg total) by mouth 2 (two) times daily. 12/22/17  Yes Lonia BloodMcClung, Jeffrey T, MD  tamsulosin (FLOMAX) 0.4 MG CAPS capsule Take 0.4 mg by mouth daily.   Yes [provider]    Physical Exam: Vitals:   03/22/19 1830 03/22/19 1845 03/22/19 1900 03/22/19 2108  BP: (!) 121/48  (!) 124/51 (!) 107/45  Pulse: 87 91 84 86  Resp: 18 19 19  (!) 22  Temp:      TempSrc:      SpO2: 97% 96% 97% 98%  Weight:      Height:        Constitutional: Looks acutely ill. Eyes: PERRL, lids and conjunctivae are mildly injected. ENMT: Mucous membranes are moist. Posterior pharynx clear of any exudate or lesions. Neck: normal, supple, no  masses, no thyromegaly Respiratory: Decreased breath sounds on bases, otherwise clear to auscultation bilaterally, no wheezing, no crackles.  Tachypneic in the mid 20s.. No accessory muscle use.  Cardiovascular: Regular rate and rhythm, no murmurs / rubs / gallops. No extremity edema. 2+ pedal pulses. No carotid bruits.  Abdomen: Obese, nondistended. Bowel sounds positive. Soft, no tenderness, no masses palpated. No hepatosplenomegaly.   Musculoskeletal: no clubbing / cyanosis. No joint deformity upper and lower extremities. Good ROM, no contractures. Normal muscle tone.  Skin: no rashes, lesions, ulcers on limited dermatological examination. Neurologic: CN 2-12 grossly intact. Sensation intact, DTR normal. Strength 5/5 in all 4.  Psychiatric: Normal judgment and insight. Alert and oriented x 3. Normal mood.   Labs on Admission: I have personally reviewed following labs and imaging studies  CBC:  Recent Labs  Lab 03/22/19 1814  WBC 10.2  NEUTROABS 9.1*  HGB 13.1  HCT 38.4*  MCV 88.7  PLT 233   Basic Metabolic Panel: Recent Labs  Lab 03/22/19 1814  NA 132*  K 3.2*  CL 103  CO2 19*  GLUCOSE 187*  BUN 19  CREATININE 1.07  CALCIUM 7.9*  MG 1.7  PHOS 1.9*   GFR: Estimated Creatinine Clearance: 86.5 mL/min (by C-G formula based on SCr of 1.07 mg/dL). Liver Function Tests: Recent Labs  Lab 03/22/19 1814  AST 22  ALT 16  ALKPHOS 54  BILITOT 0.8  PROT 6.8  ALBUMIN 3.5   No results for input(s): LIPASE, AMYLASE in the last 168 hours. No results for input(s): AMMONIA in the last 168 hours. Coagulation Profile: No results for input(s): INR, PROTIME in the last 168 hours. Cardiac Enzymes: No results for input(s): CKTOTAL, CKMB, CKMBINDEX, TROPONINI in the last 168 hours. BNP (last 3 results) No results for input(s): PROBNP in the last 8760 hours. HbA1C: No results for input(s): HGBA1C in the last 72 hours. CBG: Recent Labs  Lab 03/22/19 1748  GLUCAP 183*   Lipid  Profile: Recent Labs    03/22/19 1814  TRIG 189*   Thyroid Function Tests: No results for input(s): TSH, T4TOTAL, FREET4, T3FREE, THYROIDAB in the last 72 hours. Anemia Panel: Recent Labs    03/22/19 1814  FERRITIN 130   Urine analysis: No results found for: COLORURINE, APPEARANCEUR, LABSPEC, PHURINE, GLUCOSEU, HGBUR, BILIRUBINUR, KETONESUR, PROTEINUR, UROBILINOGEN, NITRITE, LEUKOCYTESUR  Radiological Exams on Admission: Dg Chest Port 1 View  Result Date: 03/22/2019 CLINICAL DATA:  Hypoxia. Shortness of breath. Cough. COVID-19 viral infection. EXAM: PORTABLE CHEST 1 VIEW COMPARISON:  03/20/2019 FINDINGS: Heart size remains within normal limits. Previous median sternotomy. Both lungs are clear. No evidence of pleural effusion. Several old right rib fracture deformities are again noted. IMPRESSION: Stable exam.  No active disease. Electronically Signed   By: Danae Orleans M.D.   On: 03/22/2019 19:27   12/21/2017 cardiac catheterization Conclusion    RPDA-1 lesion is 50% stenosed.  RPDA-2 lesion is 70% stenosed.  Mid RCA lesion is 35% stenosed.  Prox Cx to Mid Cx lesion is 70% stenosed.  Ost 1st Mrg lesion is 85% stenosed.  Ost 1st Diag lesion is 90% stenosed.  Prox LAD lesion is 70% stenosed.  Prox LAD to Mid LAD lesion is 100% stenosed.  Prox Graft lesion is 50% stenosed.  SVG graft was visualized by angiography and is normal in caliber.  SVG graft was visualized by angiography and is very large.  The graft exhibits no disease.  LIMA graft was visualized by angiography and is normal in caliber.  The graft exhibits no disease.  The left ventricular systolic function is normal.  LV end diastolic pressure is mildly elevated.  The left ventricular ejection fraction is 55-65% by visual estimate.   1. Severe 2 vessel obstructive CAD.     - 100% mid LAD    - 90% first diagonal    - 70% mid LCx    - 85% OM 1    - there is a high RV marginal branch with 70%  stenosis.    - 70% distal PDA 2. Patent LIMA to the LAD 3. Patent SVG to the diagonal. The SVG has a stent proximally with a focal 50% stenosis 4. Patent SVG to the OM 1 5. Normal LV function 6. Normal LVEDP  Plan: I do not see anything angiographically that would  explain his prolonged resting chest pain. He does have chronic small vessel disease. His grafts are patent. Would recommend continued medical therapy. Will discontinue IV Ntg and resume po. Titrate antianginal therapy as BP allows. Can resume Eliquis in am.     EKG: Independently reviewed.  Vent. rate 88 BPM PR interval * ms QRS duration 100 ms QT/QTc 379/459 ms P-R-T axes 44 142 86 Sinus rhythm Right axis deviation  Assessment/Plan Principal Problem:   Pneumonia due to COVID-19 virus Very subtle on chest radiograph. He had an episode of desaturation in the mid 80s PTA to ED. He has significant CAD and is physically deconditioned. Continue supplemental oxygen. Continue Zithromax 250 mg p.o. daily x4 doses. Received Solu-Medrol earlier in the ED. Antitussives as needed. Vitamin C and zinc sulfate supplementation. Follow-up CBC, CMP and inflammatory markers.  Active Problems:   CAD (coronary artery disease) of bypass graft Continue carvedilol, Imdur, aspirin, atorvastatin and Ranexa.    Type 2 diabetes mellitus with other specified complication (HCC) Carbohydrate modified diet. Continue Lantus 30 units SQ at bedtime. CBG monitoring regular insulin sliding scale. Check hemoglobin A1c.    Unspecified atrial fibrillation (HCC) CHA?DS?-VASc Score of at least 3. Continue apixaban and carvedilol.    HTN (hypertension) Continue amlodipine 10 mg p.o. daily. Continue carvedilol 6.25 mg p.o. twice daily.    HLD (hyperlipidemia) Continue atorvastatin 80 mg p.o. daily. Monitor LFTs periodically.    BPH (benign prostatic hyperplasia) Continue tamsulosin 0.4 mg p.o. daily. Continue finasteride 5 mg p.o. daily.     Hypophosphatemia Replacing. The patient is also hypocalcemic. Follow-up phosphorus level as needed.    Hypocalcemia Check Vitamin D level.    Hypokalemia Replacing. Magnesium was supplemented. Follow-up potassium level.    Hyponatremia Received normal saline in the ED.    GERD (gastroesophageal reflux disease) Continue PPI.    DVT prophylaxis: On apixaban Code Status: Full code. Family Communication: Disposition Plan: Transfer to [facility] for further treatment. Consults called: Admission status: Telemetry/inpatient.   Bobette Mo MD [facility]  If 7PM-7AM, please contact night-coverage  03/22/2019, 9:20 PM   This document was prepared using Dragon voice recognition software may contain some unintended transcription errors.

## 2019-03-22 NOTE — ED Triage Notes (Signed)
Arrival via EMS from [employer] [facility] [place]. Positive covid 09/25, shortness or breath, cough

## 2019-03-22 NOTE — ED Notes (Signed)
CRITICAL VALUE ALERT  Critical Value:  Lactic acid 2.4  Date & Time Notied:  1950 03/22/2019  Provider Notified: [email or web address]  Orders Received/Actions taken: n/a

## 2019-03-22 NOTE — ED Notes (Signed)
ED Provider at bedside. 

## 2019-03-23 ENCOUNTER — Encounter (HOSPITAL_COMMUNITY): Payer: Self-pay

## 2019-03-23 ENCOUNTER — Other Ambulatory Visit: Payer: Self-pay

## 2019-03-23 LAB — HEMOGLOBIN A1C
Hgb A1c MFr Bld: 8.4 % — ABNORMAL HIGH (ref 4.8–5.6)
Mean Plasma Glucose: 194.38 mg/dL

## 2019-03-23 LAB — COMPREHENSIVE METABOLIC PANEL
ALT: 14 U/L (ref 0–44)
AST: 19 U/L (ref 15–41)
Albumin: 3.3 g/dL — ABNORMAL LOW (ref 3.5–5.0)
Alkaline Phosphatase: 63 U/L (ref 38–126)
Anion gap: 14 (ref 5–15)
BUN: 21 mg/dL (ref 8–23)
CO2: 16 mmol/L — ABNORMAL LOW (ref 22–32)
Calcium: 8.1 mg/dL — ABNORMAL LOW (ref 8.9–10.3)
Chloride: 105 mmol/L (ref 98–111)
Creatinine, Ser: 1.1 mg/dL (ref 0.61–1.24)
GFR calc Af Amer: >60 mL/min (ref >60)
GFR calc non Af Amer: >60 mL/min (ref >60)
Glucose, Bld: 333 mg/dL — ABNORMAL HIGH (ref 70–99)
Potassium: 4.2 mmol/L (ref 3.5–5.1)
Sodium: 135 mmol/L (ref 135–145)
Total Bilirubin: 0.7 mg/dL (ref 0.3–1.2)
Total Protein: 6.6 g/dL (ref 6.5–8.1)

## 2019-03-23 LAB — CBC WITH DIFFERENTIAL/PLATELET
Abs Immature Granulocytes: 0.02 10*3/uL (ref 0.00–0.07)
Basophils Absolute: 0 10*3/uL (ref 0.0–0.1)
Basophils Relative: 0 %
Eosinophils Absolute: 0 10*3/uL (ref 0.0–0.5)
Eosinophils Relative: 0 %
HCT: 37.5 % — ABNORMAL LOW (ref 39.0–52.0)
Hemoglobin: 12.5 g/dL — ABNORMAL LOW (ref 13.0–17.0)
Immature Granulocytes: 0 %
Lymphocytes Relative: 8 %
Lymphs Abs: 0.6 10*3/uL — ABNORMAL LOW (ref 0.7–4.0)
MCH: 29.9 pg (ref 26.0–34.0)
MCHC: 33.3 g/dL (ref 30.0–36.0)
MCV: 89.7 fL (ref 80.0–100.0)
Monocytes Absolute: 0.1 10*3/uL (ref 0.1–1.0)
Monocytes Relative: 1 %
Neutro Abs: 6.3 10*3/uL (ref 1.7–7.7)
Neutrophils Relative %: 91 %
Platelets: 228 10*3/uL (ref 150–400)
RBC: 4.18 MIL/uL — ABNORMAL LOW (ref 4.22–5.81)
RDW: 13.7 % (ref 11.5–15.5)
WBC: 7 10*3/uL (ref 4.0–10.5)
nRBC: 0 % (ref 0.0–0.2)

## 2019-03-23 LAB — MRSA PCR SCREENING: MRSA by PCR: NEGATIVE

## 2019-03-23 LAB — SAMPLE TO BLOOD BANK

## 2019-03-23 LAB — C-REACTIVE PROTEIN: CRP: 5.4 mg/dL — ABNORMAL HIGH (ref <1.0)

## 2019-03-23 LAB — GLUCOSE, CAPILLARY
Glucose-Capillary: 266 mg/dL — ABNORMAL HIGH (ref 70–99)
Glucose-Capillary: 325 mg/dL — ABNORMAL HIGH (ref 70–99)
Glucose-Capillary: 393 mg/dL — ABNORMAL HIGH (ref 70–99)
Glucose-Capillary: 359 mg/dL — ABNORMAL HIGH (ref 70–99)

## 2019-03-23 LAB — CBG MONITORING, ED: Glucose-Capillary: 265 mg/dL — ABNORMAL HIGH (ref 70–99)

## 2019-03-23 LAB — VITAMIN D 25 HYDROXY (VIT D DEFICIENCY, FRACTURES): Vit D, 25-Hydroxy: 11.64 ng/mL — ABNORMAL LOW (ref 30–100)

## 2019-03-23 LAB — FERRITIN: Ferritin: 128 ng/mL (ref 24–336)

## 2019-03-23 LAB — ABO/RH: ABO/RH(D): A POS

## 2019-03-23 MED ORDER — APIXABAN 5 MG PO TABS
5.0000 mg | ORAL_TABLET | Freq: Two times a day (BID) | ORAL | Status: DC
  Administered 2019-03-23 – 2019-03-28 (×11): 5 mg via ORAL
  Filled 2019-03-23 (×12): qty 1

## 2019-03-23 MED ORDER — RANOLAZINE ER 500 MG PO TB12
1000.0000 mg | ORAL_TABLET | Freq: Two times a day (BID) | ORAL | Status: DC
  Administered 2019-03-23 – 2019-03-28 (×10): 1000 mg via ORAL
  Filled 2019-03-23 (×12): qty 2

## 2019-03-23 MED ORDER — ISOSORBIDE MONONITRATE ER 30 MG PO TB24
30.0000 mg | ORAL_TABLET | Freq: Every day | ORAL | Status: DC
  Administered 2019-03-23 – 2019-03-27 (×5): 30 mg via ORAL
  Filled 2019-03-23 (×5): qty 1

## 2019-03-23 MED ORDER — PANTOPRAZOLE SODIUM 40 MG PO TBEC
40.0000 mg | DELAYED_RELEASE_TABLET | Freq: Every day | ORAL | Status: DC
  Administered 2019-03-23 – 2019-03-28 (×6): 40 mg via ORAL
  Filled 2019-03-23 (×7): qty 1

## 2019-03-23 MED ORDER — INSULIN GLARGINE 100 UNIT/ML ~~LOC~~ SOLN
45.0000 [IU] | Freq: Every day | SUBCUTANEOUS | Status: DC
  Administered 2019-03-23: 45 [IU] via SUBCUTANEOUS
  Filled 2019-03-23: qty 0.45

## 2019-03-23 MED ORDER — NITROGLYCERIN 0.4 MG SL SUBL: 0.4000 mg | SUBLINGUAL_TABLET | SUBLINGUAL | Status: DC | PRN

## 2019-03-23 MED ORDER — ACETAMINOPHEN 325 MG PO TABS
650.0000 mg | ORAL_TABLET | ORAL | Status: DC | PRN
  Administered 2019-03-23 – 2019-03-24 (×2): 650 mg via ORAL
  Filled 2019-03-23 (×2): qty 2

## 2019-03-23 MED ORDER — AMLODIPINE BESYLATE 5 MG PO TABS
10.0000 mg | ORAL_TABLET | Freq: Every day | ORAL | Status: DC
  Administered 2019-03-23 – 2019-03-28 (×6): 10 mg via ORAL
  Filled 2019-03-23 (×7): qty 2

## 2019-03-23 MED ORDER — INSULIN ASPART 100 UNIT/ML ~~LOC~~ SOLN
6.0000 [IU] | Freq: Three times a day (TID) | SUBCUTANEOUS | Status: DC
  Administered 2019-03-23 – 2019-03-24 (×2): 6 [IU] via SUBCUTANEOUS

## 2019-03-23 MED ORDER — ATORVASTATIN CALCIUM 40 MG PO TABS
80.0000 mg | ORAL_TABLET | Freq: Every day | ORAL | Status: DC
  Administered 2019-03-23 – 2019-03-27 (×5): 80 mg via ORAL
  Filled 2019-03-23 (×5): qty 2

## 2019-03-23 MED ORDER — SODIUM CHLORIDE 0.9 % IV SOLN
100.0000 mg | INTRAVENOUS | Status: AC
  Administered 2019-03-24 – 2019-03-27 (×4): 100 mg via INTRAVENOUS
  Filled 2019-03-23 (×4): qty 20

## 2019-03-23 MED ORDER — AZITHROMYCIN 250 MG PO TABS: 250.0000 mg | ORAL_TABLET | Freq: Every day | ORAL | Status: DC

## 2019-03-23 MED ORDER — RANOLAZINE ER 500 MG PO TB12
1000.0000 mg | ORAL_TABLET | Freq: Two times a day (BID) | ORAL | Status: DC
  Filled 2019-03-23 (×4): qty 2

## 2019-03-23 MED ORDER — ASPIRIN EC 81 MG PO TBEC
81.0000 mg | DELAYED_RELEASE_TABLET | Freq: Every day | ORAL | Status: DC
  Administered 2019-03-23 – 2019-03-28 (×6): 81 mg via ORAL
  Filled 2019-03-23 (×7): qty 1

## 2019-03-23 MED ORDER — ENSURE MAX PROTEIN PO LIQD
11.0000 [oz_av] | Freq: Two times a day (BID) | ORAL | Status: DC
  Administered 2019-03-23 – 2019-03-28 (×11): 11 [oz_av] via ORAL
  Filled 2019-03-23 (×14): qty 330

## 2019-03-23 MED ORDER — IPRATROPIUM-ALBUTEROL 20-100 MCG/ACT IN AERS
2.0000 | INHALATION_SPRAY | Freq: Four times a day (QID) | RESPIRATORY_TRACT | Status: DC
  Administered 2019-03-23 – 2019-03-28 (×20): 2 via RESPIRATORY_TRACT
  Filled 2019-03-23: qty 4

## 2019-03-23 MED ORDER — ADULT MULTIVITAMIN W/MINERALS CH
1.0000 | ORAL_TABLET | Freq: Every day | ORAL | Status: DC
  Administered 2019-03-23 – 2019-03-28 (×6): 1 via ORAL
  Filled 2019-03-23 (×7): qty 1

## 2019-03-23 MED ORDER — INSULIN GLARGINE 100 UNIT/ML ~~LOC~~ SOLN
30.0000 [IU] | Freq: Every day | SUBCUTANEOUS | Status: DC
  Filled 2019-03-23: qty 0.3

## 2019-03-23 MED ORDER — SODIUM CHLORIDE 0.9 % IV SOLN
200.0000 mg | Freq: Once | INTRAVENOUS | Status: AC
  Administered 2019-03-23: 12:00:00 200 mg via INTRAVENOUS
  Filled 2019-03-23: qty 40

## 2019-03-23 MED ORDER — FUROSEMIDE 10 MG/ML IJ SOLN
40.0000 mg | Freq: Once | INTRAMUSCULAR | Status: AC
  Administered 2019-03-23: 40 mg via INTRAVENOUS
  Filled 2019-03-23: qty 4

## 2019-03-23 MED ORDER — CARVEDILOL 12.5 MG PO TABS
6.2500 mg | ORAL_TABLET | Freq: Two times a day (BID) | ORAL | Status: DC
  Administered 2019-03-23 – 2019-03-28 (×11): 6.25 mg via ORAL
  Filled 2019-03-23 (×12): qty 2

## 2019-03-23 MED ORDER — METHYLPREDNISOLONE SODIUM SUCC 125 MG IJ SOLR
60.0000 mg | Freq: Two times a day (BID) | INTRAMUSCULAR | Status: DC
  Administered 2019-03-23 – 2019-03-25 (×5): 60 mg via INTRAVENOUS
  Filled 2019-03-23 (×5): qty 2

## 2019-03-23 MED ORDER — FINASTERIDE 5 MG PO TABS
5.0000 mg | ORAL_TABLET | Freq: Every day | ORAL | Status: DC
  Administered 2019-03-23 – 2019-03-28 (×6): 5 mg via ORAL
  Filled 2019-03-23 (×7): qty 1

## 2019-03-23 MED ORDER — INFLUENZA VAC SPLIT QUAD 0.5 ML IM SUSY
0.5000 mL | PREFILLED_SYRINGE | INTRAMUSCULAR | Status: DC
  Filled 2019-03-23: qty 0.5

## 2019-03-23 MED ORDER — TAMSULOSIN HCL 0.4 MG PO CAPS
0.4000 mg | ORAL_CAPSULE | Freq: Every day | ORAL | Status: DC
  Administered 2019-03-23 – 2019-03-28 (×6): 0.4 mg via ORAL
  Filled 2019-03-23 (×6): qty 1

## 2019-03-23 MED ORDER — LEVOTHYROXINE SODIUM 50 MCG PO TABS
75.0000 ug | ORAL_TABLET | Freq: Every day | ORAL | Status: DC
  Administered 2019-03-23 – 2019-03-28 (×6): 75 ug via ORAL
  Filled 2019-03-23 (×6): qty 2

## 2019-03-23 NOTE — Progress Notes (Signed)
Pharmacy Note - Remdesivir Dosing  O: 63 yo male positive for COVID-19. Pharmacy consulted for remdesivir dosing. Patient is on 2L of [place] with O2 sat 98%. Chest xray on 10/6 does not indicate lower respiratory tract infection. ALT is 14. Patient meets the criteria for remdesivir of COVID-19 positive, inpatient with hypoxemia, and ALT <220. Evidence of infection on chest xray is not needed.   ALT: 14 CXR (10/6): no active disease Requiring supplemental O2: On 2L [place] with O2 sat 98%   A/P:  Patient meets criteria for remdesivir.  Begin remdesivir 200 mg IV x 1, followed by 100 mg IV daily x 4 days  Monitor ALT, clinical progress  Cristela Felt, PharmD PGY1 Pharmacy Resident  03/23/2019 7:54 AM

## 2019-03-23 NOTE — Progress Notes (Signed)
Inpatient Diabetes Program Recommendations  AACE/ADA: New Consensus Statement on Inpatient Glycemic Control   Target Ranges:  Prepandial:   less than 140 mg/dL      Peak postprandial:   less than 180 mg/dL (1-2 hours)      Critically ill patients:  140 - 180 mg/dL   Results for Kevin Ballard, Kevin Ballard (MRN [id number]) as of 03/23/2019 11:04  Ref. Range 03/22/2019 17:48 03/23/2019 01:18 03/23/2019 03:36 03/23/2019 07:53  Glucose-Capillary Latest Ref Range: 70 - 99 mg/dL 183 (H) 265 (H) 266 (H) 325 (H)   Review of Glycemic Control  Diabetes history: DM2 Outpatient Diabetes medications: Lantus 30 units daily, Metformin 1000 mg daily Current orders for Inpatient glycemic control: Lantus 30 units QHS, Novolog 0-20 units TID with meals; Solumedrol 60 mg Q12H  Inpatient Diabetes Program Recommendations:   Insulin-Basal: Fasting glucose 325 mg/dl and per chart patient did not receive any Lantus last night (ordered to start at bedtime today). May want to consider changing frequency of Lantus 30 units to daily so patient will receive now.  Insulin-Correction: Please consider ordering Novolog 0-5 units QHS for bedtime correction.  Insulin-Meal Coverage: If steroids are continued, please consider ordering Novolog 4 units TID with meals for meal coverage if patient eats at least 50% of meals.  Thanks, Barnie Alderman, RN, MSN, CDE Diabetes Coordinator Inpatient Diabetes Program [phone] (Team Pager from 8am to 5pm)

## 2019-03-23 NOTE — Progress Notes (Signed)
PROGRESS NOTE                                                                                                                                                                                                             Patient Demographics:    Kevin Ballard, is a 63 y.o. male, DOB - 25-Jan-1956, ZOX:[id number]  Outpatient Primary MD for the patient is Patient, No Pcp Per   Admit date - 03/22/2019   LOS - 1  Chief Complaint  Patient presents with  . Shortness of Breath       Brief Narrative: Patient is a 63 y.o. male with PMHx of PAF, BPH, CAD with PCI 3 years back and CABG approximately 4 years back s/p CABG/PCI-[facility]-presented to the hospital with acute hypoxemic respiratory failure secondary to COVID-19 pneumonia.  He was apparently diagnosed with COVID-19 on 9/25.  See below for further details.   Subjective:    Kevin Ballard today feels slightly better-continues to have some amount of exertional dyspnea.  Continues to cough.   Assessment  & Plan :   Acute Hypoxic Resp Failure due to Covid 19 Viral pneumonia: Still on 2 L of oxygen this morning-continue steroids-start Remdesivir.  No evidence of bacterial infection-stop Zithromax.  Fever: afebrile/  O2 requirements: On 2l/m   COVID-19 Labs: Recent Labs    03/22/19 1814 03/22/19 2129  DDIMER 0.27  --   FERRITIN 130 128  LDH 188  --   CRP 5.3* 5.4*    Lab Results  Component Value Date   SARSCOV2NAA POSITIVE (A) 03/12/2019     COVID-19 Medications: Steroids: 10/6>> Remdesivir: 10/7 Actemra: Not indicated Convalescent Plasma: Not indicated Research Studies:N/A  Other medications: Diuretics:Euvolemic- Lasix x1 today to maintain negative balance Antibiotics:Not needed as no evidence of bacterial infection  Prone/Incentive Spirometry: encouraged patient to lie prone for 3-4 hours at a time for a total of 16 hours a day,  and to encourage incentive spirometry use 3-4/hour.  DVT Prophylaxis  : Eliquis  PAF: Maintaining sinus rhythm-continue Coreg and Eliquis.  CAD: S/p PCI and CABG 2-3 years back-currently without any anginal symptoms.  Continue aspirin, beta-blocker and statin  Dyslipidemia: Continue statin  DM-2 (A1c 8.4): CBGs uncontrolled due to patient being on steroids.  Increase Lantus to 45 units, add 6 units of NovoLog  with meals-continue SSI and follow.  CBG (last 3)  Recent Labs    03/23/19 0336 03/23/19 0753 03/23/19 1140  GLUCAP 266* 325* 393*   BPH: Continue finasteride and Flomax  Hypothyroidism: Continue levothyroxine  Nutrition Problem: Nutrition Problem: Increased nutrient needs Etiology: acute illness(COVID-19) Signs/Symptoms: estimated needs Interventions: Refer to RD note for recommendations  Obesity: Estimated body mass index is 36.89 kg/m as calculated from the following:   Height as of this encounter: 5\' 9"  (1.753 m).   Weight as of this encounter: 113.3 kg.   ABG: No results found for: PHART, PCO2ART, PO2ART, HCO3, TCO2, ACIDBASEDEF, O2SAT  Vent Settings: N/A FiO2 (%):  [2 %] 2 %  Condition - Stable  Family Communication  : None at bedside  Code Status :  Full Code  Diet :  Diet Order            Diet heart healthy/carb modified Room service appropriate? Yes; Fluid consistency: Thin  Diet effective now               Disposition Plan  :  Remain hospitalized-  Barriers to discharge:still hypoxic-needs 5 days of IV Remdesivir  Consults  :  None  Procedures  :  None  Antibiotics  :    Anti-infectives (From admission, onward)   Start     Dose/Rate Route Frequency Ordered Stop   03/24/19 1000  remdesivir 100 mg in sodium chloride 0.9 % 250 mL IVPB     100 mg 500 mL/hr over 30 Minutes Intravenous Every 24 hours 03/23/19 0923 03/28/19 0959   03/23/19 1000  azithromycin (ZITHROMAX) tablet 250 mg  Status:  Discontinued    Note to Pharmacy: Take as  directed per package instructions starting on 03/22/2019. Take 2 tablets (500mg  total) on day 1, then take 1 tablet (250mg  total) on days 2 through 5.     250 mg Oral Daily 03/23/19 0301 03/23/19 0730   03/23/19 1000  remdesivir 200 mg in sodium chloride 0.9 % 250 mL IVPB     200 mg 500 mL/hr over 30 Minutes Intravenous Once 03/23/19 0923 03/23/19 1407      Inpatient Medications  Scheduled Meds: . amLODipine  10 mg Oral Daily  . apixaban  5 mg Oral BID  . aspirin EC  81 mg Oral Daily  . atorvastatin  80 mg Oral QHS  . carvedilol  6.25 mg Oral BID WC  . finasteride  5 mg Oral Daily  . [START ON 03/29/2019] influenza vac split quadrivalent PF  0.5 mL Intramuscular Tomorrow-1000  . insulin aspart  0-20 Units Subcutaneous TID WC  . insulin glargine  30 Units Subcutaneous QHS  . Ipratropium-Albuterol  2 puff Inhalation Q6H  . isosorbide mononitrate  30 mg Oral QHS  . levothyroxine  75 mcg Oral QAC breakfast  . methylPREDNISolone (SOLU-MEDROL) injection  60 mg Intravenous Q12H  . multivitamin with minerals  1 tablet Oral Daily  . pantoprazole  40 mg Oral Daily  . phosphorus  500 mg Oral QID  . Ensure Max Protein  11 oz Oral BID  . ranolazine  1,000 mg Oral BID  . tamsulosin  0.4 mg Oral Daily  . vitamin C  500 mg Oral Daily  . zinc sulfate  220 mg Oral Daily   Continuous Infusions: . [START ON 03/24/2019] remdesivir 100 mg in NS 250 mL     PRN Meds:.acetaminophen, chlorpheniramine-HYDROcodone, guaiFENesin-dextromethorphan, nitroGLYCERIN, prochlorperazine   Time Spent in minutes  25  See all Orders from  today for further details   Jeoffrey Massed M.D on 03/23/2019 at 2:10 PM  To page go to [email or web address] - use universal password  [facility] -  Office  [phone]    Objective:   Vitals:   03/23/19 0300 03/23/19 0333 03/23/19 0753 03/23/19 0926  BP: (!) 129/57 (!) 125/50 (!) 112/58   Pulse: 73  73 90  Resp: 14  16 13   Temp: (!) 97.3 F (36.3 C)  97.6 F (36.4  C)   TempSrc: Axillary  Oral   SpO2: 99%  97% 94%  Weight:      Height:        Wt Readings from Last 3 Encounters:  03/22/19 113.3 kg  03/17/19 113.4 kg  03/12/19 113.4 kg     Intake/Output Summary (Last 24 hours) at 03/23/2019 1410 Last data filed at 03/23/2019 1400 Gross per 24 hour  Intake 780 ml  Output -  Net 780 ml     Physical Exam Gen Exam:Alert awake-not in any distress HEENT:atraumatic, normocephalic Chest: B/L clear to auscultation anteriorly CVS:S1S2 regular Abdomen:soft non tender, non distended Extremities:no edema Neurology: Non focal Skin: no rash   Data Review:    CBC Recent Labs  Lab 03/22/19 1814 03/23/19 0500  WBC 10.2 7.0  HGB 13.1 12.5*  HCT 38.4* 37.5*  PLT 233 228  MCV 88.7 89.7  MCH 30.3 29.9  MCHC 34.1 33.3  RDW 13.4 13.7  LYMPHSABS 0.6* 0.6*  MONOABS 0.4 0.1  EOSABS 0.0 0.0  BASOSABS 0.0 0.0    Chemistries  Recent Labs  Lab 03/22/19 1814 03/23/19 0500  NA 132* 135  K 3.2* 4.2  CL 103 105  CO2 19* 16*  GLUCOSE 187* 333*  BUN 19 21  CREATININE 1.07 1.10  CALCIUM 7.9* 8.1*  MG 1.7  --   AST 22 19  ALT 16 14  ALKPHOS 54 63  BILITOT 0.8 0.7   ------------------------------------------------------------------------------------------------------------------ Recent Labs    03/22/19 1814  TRIG 189*    Lab Results  Component Value Date   HGBA1C 8.4 (H) 03/22/2019   ------------------------------------------------------------------------------------------------------------------ No results for input(s): TSH, T4TOTAL, T3FREE, THYROIDAB in the last 72 hours.  Invalid input(s): FREET3 ------------------------------------------------------------------------------------------------------------------ Recent Labs    03/22/19 1814 03/22/19 2129  FERRITIN 130 128    Coagulation profile No results for input(s): INR, PROTIME in the last 168 hours.  Recent Labs    03/22/19 1814  DDIMER 0.27    Cardiac Enzymes  No results for input(s): CKMB, TROPONINI, MYOGLOBIN in the last 168 hours.  Invalid input(s): CK ------------------------------------------------------------------------------------------------------------------    Component Value Date/Time   BNP 159.0 (H) 03/22/2019 1814    Micro Results Recent Results (from the past 240 hour(s))  Blood Culture (routine x 2)     Status: None (Preliminary result)   Collection Time: 03/22/19  6:14 PM   Specimen: Right Antecubital; Blood  Result Value Ref Range Status   Specimen Description RIGHT ANTECUBITAL  Final   Special Requests   Final    BOTTLES DRAWN AEROBIC AND ANAEROBIC Blood Culture adequate volume   Culture   Final    NO GROWTH < 24 HOURS Performed at [facility], [address] [place], [place]    Report Status PENDING  Incomplete  Blood Culture (routine x 2)     Status: None (Preliminary result)   Collection Time: 03/22/19  6:40 PM   Specimen: BLOOD LEFT HAND  Result Value Ref Range Status   Specimen Description BLOOD LEFT HAND  Final   Special Requests   Final    BOTTLES DRAWN AEROBIC AND ANAEROBIC Blood Culture adequate volume   Culture   Final    NO GROWTH < 12 HOURS Performed at [facility], [address] [place], [place] [address]    Report Status PENDING  Incomplete  MRSA PCR Screening     Status: None   Collection Time: 03/23/19  9:40 AM   Specimen: Nasal Mucosa; Nasopharyngeal  Result Value Ref Range Status   MRSA by PCR NEGATIVE NEGATIVE Final    Comment:        The GeneXpert MRSA Assay (FDA approved for NASAL specimens only), is one component of a comprehensive MRSA colonization surveillance program. It is not intended to diagnose MRSA infection nor to guide or monitor treatment for MRSA infections. Performed at [facility], [address], [place], [place] [address]     Radiology Reports Dg Chest Port 1 View  Result Date: 03/22/2019 CLINICAL DATA:  Hypoxia. Shortness of  breath. Cough. COVID-19 viral infection. EXAM: PORTABLE CHEST 1 VIEW COMPARISON:  03/20/2019 FINDINGS: Heart size remains within normal limits. Previous median sternotomy. Both lungs are clear. No evidence of pleural effusion. Several old right rib fracture deformities are again noted. IMPRESSION: Stable exam.  No active disease. Electronically Signed   By: Danae Orleans M.D.   On: 03/22/2019 19:27   Dg Chest Port 1 View  Result Date: 03/12/2019 CLINICAL DATA:  Nurse notes:Pt has a Covid pt on his prison unit CP since last night Shortness of breath since this morning NTG x 3 without relief SR on monitor on the way in EMS reports pt had fever 100.4 last night. Reports chest pai.*comment was truncated*chest pain covid exposure EXAM: PORTABLE CHEST 1 VIEW COMPARISON:  Radiograph 02/18/2018 FINDINGS: Sternotomy wires overlie normal cardiac silhouette. Normal pulmonary vasculature. No effusion, infiltrate, or pneumothorax. No acute osseous abnormality. IMPRESSION: No acute cardiopulmonary process. Electronically Signed   By: Genevive Bi M.D.   On: 03/12/2019 16:48

## 2019-03-23 NOTE — Progress Notes (Signed)
Declined to have RN call and update sister at beginning of shift.

## 2019-03-23 NOTE — Evaluation (Signed)
Physical Therapy Evaluation Patient Details Name: Kevin Ballard MRN: [id number] DOB: 23-Jun-1955 Today's Date: 03/23/2019   History of Present Illness  63 y/o male w/ hx of DM, CAD, MI, A-Fib, HTN, HLD, GERD, BPH, L heart cath w/ grafts, lumbar fusion, MCL repair, tibiastomy. dx w/ covid 09/25 return to hospital 10/6 w/ worse sx. Pt currently incarcerated, but has been seen in ER multi times.  Clinical Impression   Pt admitted with above diagnosis. Pt currently with functional limitations due to the deficits listed below (see PT Problem List). Pt will benefit from skilled PT to increase his activity tolerance, independence and safety with mobility to allow discharge to the venue listed below.       Follow Up Recommendations No PT follow up(at d/c should be functionally able to return to camp)    Equipment Recommendations  None recommended by PT    Recommendations for Other Services OT consult     Precautions / Restrictions Precautions Precautions: Fall;Other (comment)(02 saturation needing monitoring) Restrictions Weight Bearing Restrictions: No      Mobility  Bed Mobility Overal bed mobility: Modified Independent                Transfers Overall transfer level: Modified independent Equipment used: None             General transfer comment: moves slowly but able to complete  Ambulation/Gait Ambulation/Gait assistance: Supervision Gait Distance (Feet): 120 Feet Assistive device: None Gait Pattern/deviations: Step-through pattern     General Gait Details: slow cadenced gait, standing breaks noted but brief, on room air sats remain >90 throughout  Stairs            Wheelchair Mobility    Modified Rankin (Stroke Patients Only)       Balance Overall balance assessment: Needs assistance Sitting-balance support: No upper extremity supported;Feet unsupported Sitting balance-Leahy Scale: Good   Postural control: Other (comment)(no leans) Standing  balance support: No upper extremity supported Standing balance-Leahy Scale: Good Standing balance comment: able to stand and complete UE tasks in multi directions w/o LOBs                             Pertinent Vitals/Pain Pain Assessment: (states has some 'body aches" from breathing too hard)    Home Living Family/patient expects to be discharged to:: Dent[facility]/Prison                      Prior Function Level of Independence: Independent               Hand Dominance        Extremity/Trunk Assessment   Upper Extremity Assessment Upper Extremity Assessment: Overall WFL for tasks assessed    Lower Extremity Assessment Lower Extremity Assessment: Overall WFL for tasks assessed    Cervical / Trunk Assessment Cervical / Trunk Assessment: Normal  Communication   Communication: No difficulties  Cognition Arousal/Alertness: Awake/alert Behavior During Therapy: WFL for tasks assessed/performed Overall Cognitive Status: Within Functional Limits for tasks assessed                                        General Comments General comments (skin integrity, edema, etc.): Pt does well with mobility, moved very slow but is functional, he is able to complete tasks in assessment on room air and maintain sats >90s but  noted to be breathing hard. 02 sats read via pedi finger probe. Towards end of session once completed with everything sats 88-94% but pt asked to be placed back on 02. on 2L/min sats 97%    Exercises Other Exercises Other Exercises: reinforced IS and also flutter valve exercises   Assessment/Plan    PT Assessment Patient needs continued PT services(during hospitalization to increase activity tolerance)  PT Problem List Decreased activity tolerance;Decreased coordination;Decreased safety awareness       PT Treatment Interventions Gait training;Functional mobility training;Neuromuscular re-education;Balance training;Patient/family  education    PT Goals (Current goals can be found in the Care Plan section)  Acute Rehab PT Goals Patient Stated Goal: get better and return to camp, has transfer to winter camp coming up PT Goal Formulation: With patient Time For Goal Achievement: 04/06/19 Potential to Achieve Goals: Good    Frequency Min 3X/week   Barriers to discharge Other (comment)(returns to camp with multi infected cohorts and guards)      Co-evaluation               AM-PAC PT "6 Clicks" Mobility  Outcome Measure Help needed turning from your back to your side while in a flat bed without using bedrails?: None Help needed moving from lying on your back to sitting on the side of a flat bed without using bedrails?: None Help needed moving to and from a bed to a chair (including a wheelchair)?: None Help needed standing up from a chair using your arms (e.g., wheelchair or bedside chair)?: None Help needed to walk in hospital room?: A Little Help needed climbing 3-5 steps with a railing? : A Little 6 Click Score: 22    End of Session Equipment Utilized During Treatment: Oxygen Activity Tolerance: Treatment limited secondary to medical complications (Comment) Patient left: in chair;with call bell/phone within reach Nurse Communication: Mobility status PT Visit Diagnosis: Other abnormalities of gait and mobility (R26.89)    Time: 0086-7619 PT Time Calculation (min) (ACUTE ONLY): 26 min   Charges:   PT Evaluation $PT Eval Moderate Complexity: 1 Mod PT Treatments $Gait Training: 8-22 mins        Horald Chestnut, PT   Delford Field 03/23/2019, 1:04 PM

## 2019-03-23 NOTE — Progress Notes (Signed)
Initial Nutrition Assessment RD working remotely.  DOCUMENTATION CODES:   Obesity unspecified  INTERVENTION:    Ensure Max po BID, each supplement provides 150 kcal, 30 grams of protein, 6 gm CHO.    Multivitamin with minerals daily.   Pt receiving Hormel Shake daily with Breakfast which provides 520 kcals and 22 g of protein and Magic cup BID with lunch and dinner, each supplement provides 290 kcal and 9 grams of protein, automatically on meal trays to optimize nutritional intake.   NUTRITION DIAGNOSIS:  Increased nutrient needs related to acute illness(COVID-19) as evidenced by estimated needs.  GOAL:   Patient will meet greater than or equal to 90% of their needs  MONITOR:   PO intake, Supplement acceptance  REASON FOR ASSESSMENT:   Malnutrition Screening Tool    ASSESSMENT:   63 yo male admitted with COVID-19 PNA. PMH includes DM, HTN, CAD, A fib, BPH, HLD.   On admission, patient reported recent poor intake and weight loss. He reports diarrhea for the past week. Recent weight encounters reviewed; patient has had no significant weight loss within the past year. He consumed 50% of breakfast this morning.   Labs reviewed. Vitamin D, 25-hydroxy 11.64 (L) CBG's: [id number]  CBG's remain elevated due to inflammatory response associated with acute illness and steroid requirement.  Medications reviewed and include novolog, lantus, K Phos, solumedrol, flomax, vitamin C, zinc sulfate.    NUTRITION - FOCUSED PHYSICAL EXAM:  deferred  Diet Order:   Diet Order            Diet heart healthy/carb modified Room service appropriate? Yes; Fluid consistency: Thin  Diet effective now              EDUCATION NEEDS:   Not appropriate for education at this time  Skin:  Skin Assessment: Reviewed RN Assessment(MASD to buttocks)  Last BM:  10/5  Height:   Ht Readings from Last 1 Encounters:  03/22/19 5\' 9"  (1.753 m)    Weight:   Wt Readings from Last 1  Encounters:  03/22/19 113.3 kg    Ideal Body Weight:  72.7 kg  BMI:  Body mass index is 36.89 kg/m.  Estimated Nutritional Needs:   Kcal:  2200-2400  Protein:  120-140 gm  Fluid:  >/= 2.2 L    Molli Barrows, RD, LDN, [place] Pager [phone] After Hours Pager [phone]

## 2019-03-23 NOTE — Progress Notes (Addendum)
Attempted to call sister, voicemail left.    [place] sister point of contact returned call. Update given in room with patient, questions answered and support given.

## 2019-03-23 NOTE — Plan of Care (Signed)

## 2019-03-23 NOTE — Plan of Care (Addendum)
Pt arrived to floor completely oriented, 2L [place], vitals stable. Officers present. Feeling short of breath and tired, but has not had any diarrhea since last night. Provider was notified of pt arrival, belongings and visitor policy explained to pt. Patient states sister [name] can be contact, but wishes for her to be called only after 2pm so that calls do not interfere with work. Demographics updated.  Problem: Education: Goal: Knowledge of General Education information will improve Description: Including pain rating scale, medication(s)/side effects and non-pharmacologic comfort measures Outcome: Progressing   Problem: Health Behavior/Discharge Planning: Goal: Ability to manage health-related needs will improve Outcome: Progressing   Problem: Clinical Measurements: Goal: Ability to maintain clinical measurements within normal limits will improve Outcome: Progressing Goal: Will remain free from infection Outcome: Progressing Goal: Diagnostic test results will improve Outcome: Progressing Goal: Respiratory complications will improve Outcome: Progressing Goal: Cardiovascular complication will be avoided Outcome: Progressing   Problem: Activity: Goal: Risk for activity intolerance will decrease Outcome: Progressing   Problem: Nutrition: Goal: Adequate nutrition will be maintained Outcome: Progressing   Problem: Coping: Goal: Level of anxiety will decrease Outcome: Progressing   Problem: Elimination: Goal: Will not experience complications related to bowel motility Outcome: Progressing Goal: Will not experience complications related to urinary retention Outcome: Progressing   Problem: Pain Managment: Goal: General experience of comfort will improve Outcome: Progressing   Problem: Safety: Goal: Ability to remain free from injury will improve Outcome: Progressing   Problem: Skin Integrity: Goal: Risk for impaired skin integrity will decrease Outcome: Progressing   Problem:  Education: Goal: Knowledge of risk factors and measures for prevention of condition will improve Outcome: Progressing   Problem: Coping: Goal: Psychosocial and spiritual needs will be supported Outcome: Progressing   Problem: Respiratory: Goal: Will maintain a patent airway Outcome: Progressing Goal: Complications related to the disease process, condition or treatment will be avoided or minimized Outcome: Progressing

## 2019-03-23 NOTE — ED Provider Notes (Signed)
[facility] EMERGENCY DEPARTMENT Provider Note   CSN: [id number] Arrival date & time: 03/22/19  1739     History   Chief Complaint Chief Complaint  Patient presents with  . Shortness of Breath    HPI Kevin Ballard is a 63 y.o. male.     Kevin Ballard is a 63 y.o. male with a history of diabetes, CAD, MI, A. fib, hypertension, hyperlipidemia, GERD, BPH, diagnosed with COVID-19 on 9/25, who returns today with worsening symptoms.  [facility], since diagnosis he has been kept in quarantine and symptoms have been monitored.  He reports that over the past week he had mild shortness of breath with cough, and yesterday seemed to be doing much better but today woke up with fever, persistent cough and worsening shortness of breath.  Patient also had increased work of breathing.  Due to worsening symptoms O2 sats were checked and noted to be 88-89% on room air, they improved on 2 L nasal cannula.  Patient reports that he was found to have bilateral COVID pneumonia, they gave him prednisone and azithromycin but due to his continued symptoms he was transported to the hospital for further evaluation.  He reports that he has had very poor appetite with loss of taste and smell, but denies nausea or vomiting.  Has had some intermittent diarrhea, nonbloody.  Denies abdominal pain.  He reports his chest feels tight but he denies any severe chest pain.  Reports today he had fever of 102, last took Tylenol at noon.  Patient has been seen in the emergency department a few times since diagnosis but this is the first time he has required oxygen.     Past Medical History:  Diagnosis Date  . Atrial fibrillation (HCC)   . BPH (benign prostatic hyperplasia)   . Coronary artery disease   . Coronary artery disease involving coronary bypass graft   . Diabetes mellitus without complication (HCC)   . GERD (gastroesophageal reflux disease)   . Hypercholesterolemia   .  Hypertension   . Myocardial infarct [facility](HCC)     Patient Active Problem List   Diagnosis Date Noted  . Pneumonia due to COVID-19 virus 03/22/2019  . Hypophosphatemia 03/22/2019  . Hypokalemia 03/22/2019  . Hyponatremia 03/22/2019  . GERD (gastroesophageal reflux disease)   . CAD (coronary artery disease) of bypass graft 12/19/2017  . Type 2 diabetes mellitus with other specified complication (HCC) 12/19/2017  . Unspecified atrial fibrillation (HCC) 12/19/2017  . Chronic anticoagulation 12/19/2017  . HTN (hypertension) 12/19/2017  . HLD (hyperlipidemia) 12/19/2017  . BPH (benign prostatic hyperplasia) 12/19/2017    Past Surgical History:  Procedure Laterality Date  . LEFT HEART CATH AND CORS/GRAFTS ANGIOGRAPHY N/A 12/21/2017   Procedure: LEFT HEART CATH AND CORS/GRAFTS ANGIOGRAPHY;  Surgeon: SwazilandJordan, Peter M, MD;  [facility];  Service: Cardiovascular;  Laterality: N/A;  . LUMBAR FUSION    . MEDIAL COLLATERAL LIGAMENT REPAIR, KNEE    . TIBIAL FASCIOTOMY    . tibiastomy          Home Medications    Prior to Admission medications   Medication Sig Start Date End Date Taking? Authorizing Provider  acetaminophen (TYLENOL) 325 MG tablet Take 2 tablets (650 mg total) by mouth every 4 (four) hours as needed for headache or mild pain. 12/22/17  Yes Lonia BloodMcClung, Jeffrey T, MD  amLODipine (NORVASC) 10 MG tablet Take 10 mg by mouth daily.   Yes [provider]  apixaban (ELIQUIS) 5 MG TABS tablet Take 5 mg by mouth 2 (two) times daily.   Yes [provider]  aspirin EC 81 MG tablet Take 81 mg by mouth daily.   Yes [provider]  atorvastatin (LIPITOR) 80 MG tablet Take 80 mg by mouth at bedtime.    Yes [provider]  azithromycin (ZITHROMAX) 250 MG tablet Take 250-500 mg by mouth See admin instructions. Take as directed per package instructions starting on 03/22/2019. Take 2 tablets (500mg  total) on day 1, then take 1 tablet (250mg  total) on  days 2 through 5.   Yes [provider]  carvedilol (COREG) 6.25 MG tablet Take 1 tablet (6.25 mg total) by mouth 2 (two) times daily with a meal. 12/22/17  Yes Cherene Altes, MD  docusate sodium (COLACE) 100 MG capsule Take 100 mg by mouth 2 (two) times daily.   Yes [provider]  finasteride (PROSCAR) 5 MG tablet Take 5 mg by mouth daily.   Yes [provider]  insulin glargine (LANTUS) 100 UNIT/ML injection Inject 30 Units into the skin at bedtime.    Yes [provider]  isosorbide mononitrate (IMDUR) 30 MG 24 hr tablet Take 1 tablet (30 mg total) by mouth daily. Patient taking differently: Take 30 mg by mouth at bedtime.  12/23/17  Yes Cherene Altes, MD  levothyroxine (SYNTHROID) 75 MCG tablet Take 75 mcg by mouth daily before breakfast.   Yes [provider]  metFORMIN (GLUCOPHAGE) 1000 MG tablet Take 1,000 mg by mouth daily.   Yes [provider]  methylPREDNISolone (MEDROL DOSEPAK) 4 MG TBPK tablet Take 4-24 mg by mouth See admin instructions. Take as directed per package instructions for 6 days starting on 03/22/2019. (6,5,4,3,2,1)   Yes [provider]  nitroGLYCERIN (NITROSTAT) 0.4 MG SL tablet Place 0.4 mg under the tongue every 5 (five) minutes as needed for chest pain.    Yes [provider]  omeprazole (PRILOSEC) 20 MG capsule Take 20 mg by mouth every evening.    Yes [provider]  ranolazine (RANEXA) 1000 MG SR tablet Take 1 tablet (1,000 mg total) by mouth 2 (two) times daily. 12/22/17  Yes Cherene Altes, MD  tamsulosin (FLOMAX) 0.4 MG CAPS capsule Take 0.4 mg by mouth daily.   Yes [provider]    Family History Family History  Problem Relation Age of Onset  . Heart attack Mother   . Cancer Mother   . Heart attack Maternal Grandmother     Social History Social History   Tobacco Use  . Smoking status: Never Smoker  . Smokeless tobacco: Never Used  Substance Use  Topics  . Alcohol use: Never    Frequency: Never  . Drug use: Never     Allergies   Brilinta [ticagrelor]   Review of Systems Review of Systems  Constitutional: Positive for appetite change, chills and fever.  HENT: Positive for congestion.   Respiratory: Positive for cough, chest tightness and shortness of breath.   Cardiovascular: Negative for chest pain.  Gastrointestinal: Negative for abdominal pain, diarrhea, nausea and vomiting.  Musculoskeletal: Positive for myalgias.  Skin: Negative for color change.  Neurological: Positive for headaches. Negative for dizziness and syncope.  All other systems reviewed and are negative.    Physical Exam Updated Vital Signs BP (!) 124/52   Pulse 79   Temp 98.8 F (37.1 C) (Oral)   Resp 20   Ht 5\' 9"  (1.753 m)  Wt 113.3 kg   SpO2 97%   BMI 36.89 kg/m   Physical Exam Vitals signs and nursing note reviewed.  Constitutional:      General: He is not in acute distress.    Appearance: He is well-developed. He is obese. He is not diaphoretic.     Comments: Patient is mildly ill-appearing but in no acute distress.  HENT:     Head: Normocephalic and atraumatic.     Mouth/Throat:     Mouth: Mucous membranes are moist.     Pharynx: Oropharynx is clear.  Eyes:     General:        Right eye: No discharge.        Left eye: No discharge.     Pupils: Pupils are equal, round, and reactive to light.  Neck:     Musculoskeletal: Neck supple.  Cardiovascular:     Rate and Rhythm: Normal rate and regular rhythm.     Heart sounds: Normal heart sounds. No murmur. No friction rub. No gallop.   Pulmonary:     Effort: Pulmonary effort is normal. Tachypnea present. No respiratory distress.     Breath sounds: Normal breath sounds. No wheezing or rales.     Comments: Patient is mildly tachypneic, respirations equal and unlabored, patient able to speak in full sentences, on 2 L nasal cannula, lungs clear throughout but with some decreased air  movement bilaterally. Abdominal:     General: Bowel sounds are normal. There is no distension.     Palpations: Abdomen is soft. There is no mass.     Tenderness: There is no abdominal tenderness. There is no guarding.     Comments: Abdomen soft, nondistended, nontender to palpation in all quadrants without guarding or peritoneal signs  Musculoskeletal:        General: No deformity.     Right lower leg: He exhibits no tenderness. No edema.     Left lower leg: He exhibits no tenderness. No edema.  Skin:    General: Skin is warm and dry.     Capillary Refill: Capillary refill takes less than 2 seconds.  Neurological:     Mental Status: He is alert.     Coordination: Coordination normal.     Comments: Speech is clear, able to follow commands Moves extremities without ataxia, coordination intact  Psychiatric:        Mood and Affect: Mood normal.        Behavior: Behavior normal.      ED Treatments / Results  Labs (all labs ordered are listed, but only abnormal results are displayed) Labs Reviewed  LACTIC ACID, PLASMA - Abnormal; Notable for the following components:      Result Value   Lactic Acid, Venous 2.4 (*)    All other components within normal limits  CBC WITH DIFFERENTIAL/PLATELET - Abnormal; Notable for the following components:   HCT 38.4 (*)    Neutro Abs 9.1 (*)    Lymphs Abs 0.6 (*)    All other components within normal limits  COMPREHENSIVE METABOLIC PANEL - Abnormal; Notable for the following components:   Sodium 132 (*)    Potassium 3.2 (*)    CO2 19 (*)    Glucose, Bld 187 (*)    Calcium 7.9 (*)    All other components within normal limits  TRIGLYCERIDES - Abnormal; Notable for the following components:   Triglycerides 189 (*)    All other components within normal limits  FIBRINOGEN - Abnormal; Notable for  the following components:   Fibrinogen 574 (*)    All other components within normal limits  C-REACTIVE PROTEIN - Abnormal; Notable for the following  components:   CRP 5.3 (*)    All other components within normal limits  PHOSPHORUS - Abnormal; Notable for the following components:   Phosphorus 1.9 (*)    All other components within normal limits  BRAIN NATRIURETIC PEPTIDE - Abnormal; Notable for the following components:   B Natriuretic Peptide 159.0 (*)    All other components within normal limits  CBG MONITORING, ED - Abnormal; Notable for the following components:   Glucose-Capillary 183 (*)    All other components within normal limits  CULTURE, BLOOD (ROUTINE X 2)  CULTURE, BLOOD (ROUTINE X 2)  LACTIC ACID, PLASMA  D-DIMER, QUANTITATIVE (NOT AT [facility])  PROCALCITONIN  LACTATE DEHYDROGENASE  FERRITIN  MAGNESIUM  HEMOGLOBIN A1C  HIV ANTIBODY (ROUTINE TESTING W REFLEX)  HIV4GL SAVE TUBE  C-REACTIVE PROTEIN  COMPREHENSIVE METABOLIC PANEL  CBC WITH DIFFERENTIAL/PLATELET  D-DIMER, QUANTITATIVE (NOT AT [facility])  FERRITIN  MAGNESIUM  PHOSPHORUS  ABO/RH    EKG None  Radiology Dg Chest Port 1 View  Result Date: 03/22/2019 CLINICAL DATA:  Hypoxia. Shortness of breath. Cough. COVID-19 viral infection. EXAM: PORTABLE CHEST 1 VIEW COMPARISON:  03/20/2019 FINDINGS: Heart size remains within normal limits. Previous median sternotomy. Both lungs are clear. No evidence of pleural effusion. Several old right rib fracture deformities are again noted. IMPRESSION: Stable exam.  No active disease. Electronically Signed   By: Danae Orleans M.D.   On: 03/22/2019 19:27    Procedures Procedures (including critical care time)  Medications Ordered in ED Medications  insulin aspart (novoLOG) injection 0-20 Units (has no administration in time range)  vitamin C (ASCORBIC ACID) tablet 500 mg (500 mg Oral Not Given 03/22/19 2300)  zinc sulfate capsule 220 mg (220 mg Oral Not Given 03/22/19 2300)  guaiFENesin-dextromethorphan (ROBITUSSIN DM) 100-10 MG/5ML syrup 10 mL (has no administration in time range)  chlorpheniramine-HYDROcodone (TUSSIONEX)  10-8 MG/5ML suspension 5 mL (has no administration in time range)  prochlorperazine (COMPAZINE) injection 10 mg (has no administration in time range)  phosphorus (K PHOS NEUTRAL) tablet 500 mg (has no administration in time range)  methylPREDNISolone sodium succinate (SOLU-MEDROL) 125 mg/2 mL injection 60 mg (60 mg Intravenous Given 03/22/19 2057)  0.9 % NaCl with KCl 20 mEq/ L  infusion ( Intravenous Stopped 03/22/19 2220)  albuterol (VENTOLIN HFA) 108 (90 Base) MCG/ACT inhaler 4 puff (4 puffs Inhalation Given 03/22/19 1821)  acetaminophen (TYLENOL) tablet 1,000 mg (1,000 mg Oral Given 03/22/19 2058)  potassium chloride SA (KLOR-CON) CR tablet 40 mEq (40 mEq Oral Given 03/22/19 2057)  magnesium sulfate IVPB 2 g 50 mL (0 g Intravenous Stopped 03/22/19 2157)     Initial Impression / Assessment and Plan / ED Course  I have reviewed the triage vital signs and the nursing notes.  Pertinent labs & imaging results that were available during my care of the patient were reviewed by me and considered in my medical decision making (see chart for details).  63 year old male, known COVID positive since 9/25, [facility] now with 2 L oxygen requirement and worsening shortness of breath and cough.  Febrile today to 102.  On arrival patient with normal vitals on 2 L nasal cannula, intermittently mildly tachypneic.  Mildly ill-appearing but in no acute distress.  Lungs some decreased air movement.  At this point with oxygen requirement patient will  require admission for COVID-19, bilateral pneumonia noted on chest x-ray, labs ordered including COVID 19 inflammatory markers.  Patient has already received prednisone today, but will order albuterol to help with breathing.  Tylenol for mild headache.  Labs thus far show no leukocytosis, mild hypokalemia, CO2 of 19, glucose of 187, the rest of patient's lab work is pending, but given known COVID positive state with oxygen requirement will go  ahead and consult hospitalist for likely admission to [facility].  Case discussed with Dr. Robb Matar with [facility] hospitalist who will see and admit the patient to [facility].  Final Clinical Impressions(s) / ED Diagnoses   Final diagnoses:  Pneumonia due to COVID-19 virus    ED Discharge Orders    None       Legrand Rams 03/23/19 0108    Eber Hong, MD 03/25/19 [phone]

## 2019-03-23 NOTE — Progress Notes (Signed)
This RN called patient's sister [name] for update on patient status and plan of care. It is best for [name] to be called once per day after 2pm. Will pass on to day team

## 2019-03-24 DIAGNOSIS — N400 Enlarged prostate without lower urinary tract symptoms: Secondary | ICD-10-CM

## 2019-03-24 DIAGNOSIS — I10 Essential (primary) hypertension: Secondary | ICD-10-CM

## 2019-03-24 DIAGNOSIS — E1169 Type 2 diabetes mellitus with other specified complication: Secondary | ICD-10-CM

## 2019-03-24 DIAGNOSIS — Z794 Long term (current) use of insulin: Secondary | ICD-10-CM

## 2019-03-24 LAB — CBC WITH DIFFERENTIAL/PLATELET
Abs Immature Granulocytes: 0.08 10*3/uL — ABNORMAL HIGH (ref 0.00–0.07)
Basophils Absolute: 0 10*3/uL (ref 0.0–0.1)
Basophils Relative: 0 %
Eosinophils Absolute: 0 10*3/uL (ref 0.0–0.5)
Eosinophils Relative: 0 %
HCT: 35.7 % — ABNORMAL LOW (ref 39.0–52.0)
Hemoglobin: 11.9 g/dL — ABNORMAL LOW (ref 13.0–17.0)
Immature Granulocytes: 1 %
Lymphocytes Relative: 7 %
Lymphs Abs: 0.8 10*3/uL (ref 0.7–4.0)
MCH: 29.9 pg (ref 26.0–34.0)
MCHC: 33.3 g/dL (ref 30.0–36.0)
MCV: 89.7 fL (ref 80.0–100.0)
Monocytes Absolute: 0.4 10*3/uL (ref 0.1–1.0)
Monocytes Relative: 3 %
Neutro Abs: 11.4 10*3/uL — ABNORMAL HIGH (ref 1.7–7.7)
Neutrophils Relative %: 89 %
Platelets: 212 10*3/uL (ref 150–400)
RBC: 3.98 MIL/uL — ABNORMAL LOW (ref 4.22–5.81)
RDW: 13.5 % (ref 11.5–15.5)
WBC: 12.7 10*3/uL — ABNORMAL HIGH (ref 4.0–10.5)
nRBC: 0 % (ref 0.0–0.2)

## 2019-03-24 LAB — COMPREHENSIVE METABOLIC PANEL
ALT: 14 U/L (ref 0–44)
AST: 16 U/L (ref 15–41)
Albumin: 3.1 g/dL — ABNORMAL LOW (ref 3.5–5.0)
Alkaline Phosphatase: 54 U/L (ref 38–126)
Anion gap: 15 (ref 5–15)
BUN: 37 mg/dL — ABNORMAL HIGH (ref 8–23)
CO2: 19 mmol/L — ABNORMAL LOW (ref 22–32)
Calcium: 8 mg/dL — ABNORMAL LOW (ref 8.9–10.3)
Chloride: 101 mmol/L (ref 98–111)
Creatinine, Ser: 1.23 mg/dL (ref 0.61–1.24)
GFR calc Af Amer: >60 mL/min (ref >60)
GFR calc non Af Amer: >60 mL/min (ref >60)
Glucose, Bld: 358 mg/dL — ABNORMAL HIGH (ref 70–99)
Potassium: 3.7 mmol/L (ref 3.5–5.1)
Sodium: 135 mmol/L (ref 135–145)
Total Bilirubin: 0.5 mg/dL (ref 0.3–1.2)
Total Protein: 6.2 g/dL — ABNORMAL LOW (ref 6.5–8.1)

## 2019-03-24 LAB — D-DIMER, QUANTITATIVE: D-Dimer, Quant: 0.32 ug/mL-FEU (ref 0.00–0.50)

## 2019-03-24 LAB — GLUCOSE, CAPILLARY
Glucose-Capillary: 273 mg/dL — ABNORMAL HIGH (ref 70–99)
Glucose-Capillary: 345 mg/dL — ABNORMAL HIGH (ref 70–99)
Glucose-Capillary: 433 mg/dL — ABNORMAL HIGH (ref 70–99)
Glucose-Capillary: 325 mg/dL — ABNORMAL HIGH (ref 70–99)
Glucose-Capillary: 230 mg/dL — ABNORMAL HIGH (ref 70–99)

## 2019-03-24 LAB — C-REACTIVE PROTEIN: CRP: 7.1 mg/dL — ABNORMAL HIGH (ref <1.0)

## 2019-03-24 LAB — FERRITIN: Ferritin: 127 ng/mL (ref 24–336)

## 2019-03-24 MED ORDER — INSULIN ASPART 100 UNIT/ML ~~LOC~~ SOLN
10.0000 [IU] | Freq: Three times a day (TID) | SUBCUTANEOUS | Status: DC
  Administered 2019-03-24 – 2019-03-25 (×3): 10 [IU] via SUBCUTANEOUS

## 2019-03-24 MED ORDER — FUROSEMIDE 10 MG/ML IJ SOLN
40.0000 mg | Freq: Once | INTRAMUSCULAR | Status: AC
  Administered 2019-03-24: 40 mg via INTRAVENOUS
  Filled 2019-03-24: qty 4

## 2019-03-24 MED ORDER — INSULIN GLARGINE 100 UNIT/ML ~~LOC~~ SOLN
55.0000 [IU] | Freq: Every day | SUBCUTANEOUS | Status: DC
  Administered 2019-03-24: 55 [IU] via SUBCUTANEOUS
  Filled 2019-03-24: qty 0.55

## 2019-03-24 NOTE — Progress Notes (Signed)
Contacted MD regarding patient blood sugar of 433. MD instructed to give patient 30 units. RN clarified if to give 30 on top of his scheduled 10 for meal coverage, MD states to give 30 units total. RN gave 20 units on sliding scale and 10 units pre meal for total of 30 units per MD order.  MD states no need to get lab confirmation. MD previously increased lantus dosage for tonight from 45 units to 55 units and increased meal time coverage from 6 units to 10 units. Will continue to monitor and follow plan of care.

## 2019-03-24 NOTE — Progress Notes (Addendum)
PROGRESS NOTE                                                                                                                                                                                                             Patient Demographics:    Merrel Crabbe, is a 63 y.o. male, DOB - 05/19/1956, GMW:[id number]  Outpatient Primary MD for the patient is Patient, No Pcp Per   Admit date - 03/22/2019   LOS - 2  Chief Complaint  Patient presents with  . Shortness of Breath       Brief Narrative: Patient is a 63 y.o. male with PMHx of PAF, BPH, CAD with PCI 3 years back and CABG approximately 4 years back s/p CABG/PCI-who is currently incarcerated at [facility] [place]-presented to the hospital with acute hypoxemic respiratory failure secondary to COVID-19 pneumonia.  He was apparently diagnosed with COVID-19 on 9/25.  See below for further details.   Subjective:    Yamil Oelke feels better-coughing has improved.  He is out of bed to chair this morning.   Assessment  & Plan :   Acute Hypoxic Resp Failure due to Covid 19 Viral pneumonia: Slowly improving-remains on 2 L of oxygen.  Watch CRP-slightly up today.  Coughing spells have improved.  Continue steroids and Remdesivir.   Fever: afebrile/  O2 requirements: On 2l/m   COVID-19 Labs: Recent Labs    03/22/19 1814 03/22/19 2129 03/24/19 0235  DDIMER 0.27  --  0.32  FERRITIN 130 128 127  LDH 188  --   --   CRP 5.3* 5.4* 7.1*    Lab Results  Component Value Date   SARSCOV2NAA POSITIVE (A) 03/12/2019     COVID-19 Medications: Steroids: 10/6>> Remdesivir: 10/7 Actemra: Not indicated Convalescent Plasma: Not indicated Research Studies:N/A  Other medications: Diuretics:Euvolemic-we will repeat 1 more dose of Lasix to maintain negative balance.  Antibiotics:Not needed as no evidence of bacterial infection  Prone/Incentive Spirometry: encouraged patient to lie prone for 3-4  hours at a time for a total of 16 hours a day, and to encourage incentive spirometry use 3-4/hour.  DVT Prophylaxis  : Eliquis  PAF: Maintaining sinus rhythm-continue Coreg and Eliquis.  CAD: S/p PCI and CABG 2-3 years back-currently without any anginal symptoms.  Continue aspirin, beta-blocker and statin  CKD stage III: Creatinine close to usual baseline-follow.  Dyslipidemia: Continue statin  DM-2 (A1c 8.4): CBGs remain uncontrolled as patient is on steroids.  Increase Lantus to 55 units, increase NovoLog to 10 units with meals.  Continue SSI and follow.    CBG (last 3)  Recent Labs    03/23/19 1619 03/23/19 2049 03/24/19 0807  GLUCAP 359* 273* 345*   BPH: Continue finasteride and Flomax  Hypothyroidism: Continue levothyroxine  Nutrition Problem: Nutrition Problem: Increased nutrient needs Etiology: acute illness(COVID-19) Signs/Symptoms: estimated needs Interventions: Refer to RD note for recommendations  Obesity: Estimated body mass index is 36.89 kg/m as calculated from the following:   Height as of this encounter: 5\' 9"  (1.753 m).   Weight as of this encounter: 113.3 kg.   ABG: No results found for: PHART, PCO2ART, PO2ART, HCO3, TCO2, ACIDBASEDEF, O2SAT  Vent Settings: N/A    Condition - Stable  Family Communication  : None at bedside  Code Status :  Full Code  Diet :  Diet Order            Diet heart healthy/carb modified Room service appropriate? Yes; Fluid consistency: Thin  Diet effective now               Disposition Plan  :  Remain hospitalized-  Barriers to discharge:still hypoxic-needs 5 days of IV Remdesivir  Consults  :  None  Procedures  :  None  Antibiotics  :    Anti-infectives (From admission, onward)   Start     Dose/Rate Route Frequency Ordered Stop   03/24/19 1000  remdesivir 100 mg in sodium chloride 0.9 % 250 mL IVPB     100 mg 500 mL/hr over 30 Minutes Intravenous Every 24 hours 03/23/19 0923 03/28/19 0959   03/23/19  1000  azithromycin (ZITHROMAX) tablet 250 mg  Status:  Discontinued    Note to Pharmacy: Take as directed per package instructions starting on 03/22/2019. Take 2 tablets (500mg  total) on day 1, then take 1 tablet (250mg  total) on days 2 through 5.     250 mg Oral Daily 03/23/19 0301 03/23/19 0730   03/23/19 1000  remdesivir 200 mg in sodium chloride 0.9 % 250 mL IVPB     200 mg 500 mL/hr over 30 Minutes Intravenous Once 03/23/19 0923 03/23/19 1407      Inpatient Medications  Scheduled Meds: . amLODipine  10 mg Oral Daily  . apixaban  5 mg Oral BID  . aspirin EC  81 mg Oral Daily  . atorvastatin  80 mg Oral QHS  . carvedilol  6.25 mg Oral BID WC  . finasteride  5 mg Oral Daily  . [START ON 03/29/2019] influenza vac split quadrivalent PF  0.5 mL Intramuscular Tomorrow-1000  . insulin aspart  0-20 Units Subcutaneous TID WC  . insulin aspart  6 Units Subcutaneous TID WC  . insulin glargine  45 Units Subcutaneous QHS  . Ipratropium-Albuterol  2 puff Inhalation Q6H  . isosorbide mononitrate  30 mg Oral QHS  . levothyroxine  75 mcg Oral QAC breakfast  . methylPREDNISolone (SOLU-MEDROL) injection  60 mg Intravenous Q12H  . multivitamin with minerals  1 tablet Oral Daily  . pantoprazole  40 mg Oral Daily  . Ensure Max Protein  11 oz Oral BID  . ranolazine  1,000 mg Oral BID  . tamsulosin  0.4 mg Oral Daily  . vitamin C  500 mg Oral Daily  . zinc sulfate  220 mg Oral Daily   Continuous Infusions: . remdesivir 100 mg in NS 250  mL 100 mg (03/24/19 1010)   PRN Meds:.acetaminophen, chlorpheniramine-HYDROcodone, guaiFENesin-dextromethorphan, nitroGLYCERIN, prochlorperazine   Time Spent in minutes  25  See all Orders from today for further details   Jeoffrey Massed M.D on 03/24/2019 at 11:01 AM  To page go to [email or web address] - use universal password  [facility] -  Office  [phone]    Objective:   Vitals:   03/23/19 1930 03/24/19 0047 03/24/19 0349 03/24/19 0747  BP:  (!) 109/58  (!) 127/53 (!) 112/56  Pulse: 74  84 76  Resp: 19 (!) 22 (!) 24 20  Temp: 98.1 F (36.7 C)  98.3 F (36.8 C) 97.9 F (36.6 C)  TempSrc: Oral  Oral Oral  SpO2: 97% 94% 91% 94%  Weight:      Height:        Wt Readings from Last 3 Encounters:  03/22/19 113.3 kg  03/17/19 113.4 kg  03/12/19 113.4 kg     Intake/Output Summary (Last 24 hours) at 03/24/2019 1101 Last data filed at 03/24/2019 0600 Gross per 24 hour  Intake 1210 ml  Output 1300 ml  Net -90 ml     Physical Exam Gen Exam:Alert awake-not in any distress HEENT:atraumatic, normocephalic Chest: B/L clear to auscultation anteriorly CVS:S1S2 regular Abdomen:soft non tender, non distended Extremities:no edema Neurology: Non focal Skin: no rash   Data Review:    CBC Recent Labs  Lab 03/22/19 1814 03/23/19 0500 03/24/19 0235  WBC 10.2 7.0 12.7*  HGB 13.1 12.5* 11.9*  HCT 38.4* 37.5* 35.7*  PLT 233 228 212  MCV 88.7 89.7 89.7  MCH 30.3 29.9 29.9  MCHC 34.1 33.3 33.3  RDW 13.4 13.7 13.5  LYMPHSABS 0.6* 0.6* 0.8  MONOABS 0.4 0.1 0.4  EOSABS 0.0 0.0 0.0  BASOSABS 0.0 0.0 0.0    Chemistries  Recent Labs  Lab 03/22/19 1814 03/23/19 0500 03/24/19 0235  NA 132* 135 135  K 3.2* 4.2 3.7  CL 103 105 101  CO2 19* 16* 19*  GLUCOSE 187* 333* 358*  BUN 19 21 37*  CREATININE 1.07 1.10 1.23  CALCIUM 7.9* 8.1* 8.0*  MG 1.7  --   --   AST ALT ALKPHOS 54 63 54  BILITOT 0.8 0.7 0.5   ------------------------------------------------------------------------------------------------------------------ Recent Labs    03/22/19 1814  TRIG 189*    Lab Results  Component Value Date   HGBA1C 8.4 (H) 03/22/2019   ------------------------------------------------------------------------------------------------------------------ No results for input(s): TSH, T4TOTAL, T3FREE, THYROIDAB in the last 72 hours.  Invalid input(s): FREET3  ------------------------------------------------------------------------------------------------------------------ Recent Labs    03/22/19 2129 03/24/19 0235  FERRITIN 128 127    Coagulation profile No results for input(s): INR, PROTIME in the last 168 hours.  Recent Labs    03/22/19 1814 03/24/19 0235  DDIMER 0.27 0.32    Cardiac Enzymes No results for input(s): CKMB, TROPONINI, MYOGLOBIN in the last 168 hours.  Invalid input(s): CK ------------------------------------------------------------------------------------------------------------------    Component Value Date/Time   BNP 159.0 (H) 03/22/2019 1814    Micro Results Recent Results (from the past 240 hour(s))  Blood Culture (routine x 2)     Status: None (Preliminary result)   Collection Time: 03/22/19  6:14 PM   Specimen: Right Antecubital; Blood  Result Value Ref Range Status   Specimen Description RIGHT ANTECUBITAL  Final   Special Requests   Final    BOTTLES DRAWN AEROBIC AND ANAEROBIC Blood Culture adequate volume   Culture   Final  NO GROWTH 2 DAYS Performed at [facility], [address], [place], [place] [address]    Report Status PENDING  Incomplete  Blood Culture (routine x 2)     Status: None (Preliminary result)   Collection Time: 03/22/19  6:40 PM   Specimen: BLOOD LEFT HAND  Result Value Ref Range Status   Specimen Description BLOOD LEFT HAND  Final   Special Requests   Final    BOTTLES DRAWN AEROBIC AND ANAEROBIC Blood Culture adequate volume   Culture   Final    NO GROWTH 2 DAYS Performed at [facility], [address], [place], [place] [address]    Report Status PENDING  Incomplete  MRSA PCR Screening     Status: None   Collection Time: 03/23/19  9:40 AM   Specimen: Nasal Mucosa; Nasopharyngeal  Result Value Ref Range Status   MRSA by PCR NEGATIVE NEGATIVE Final    Comment:        The GeneXpert MRSA Assay (FDA approved for NASAL specimens only), is one component of a  comprehensive MRSA colonization surveillance program. It is not intended to diagnose MRSA infection nor to guide or monitor treatment for MRSA infections. Performed at [facility], [address] [place], [place] [address]     Radiology Reports Dg Chest Port 1 View  Result Date: 03/22/2019 CLINICAL DATA:  Hypoxia. Shortness of breath. Cough. COVID-19 viral infection. EXAM: PORTABLE CHEST 1 VIEW COMPARISON:  03/20/2019 FINDINGS: Heart size remains within normal limits. Previous median sternotomy. Both lungs are clear. No evidence of pleural effusion. Several old right rib fracture deformities are again noted. IMPRESSION: Stable exam.  No active disease. Electronically Signed   By: Danae Orleans M.D.   On: 03/22/2019 19:27   Dg Chest Port 1 View  Result Date: 03/12/2019 CLINICAL DATA:  Nurse notes:Pt has a Covid pt on his prison unit CP since last night Shortness of breath since this morning NTG x 3 without relief SR on monitor on the way in EMS reports pt had fever 100.4 last night. Reports chest pai.*comment was truncated*chest pain covid exposure EXAM: PORTABLE CHEST 1 VIEW COMPARISON:  Radiograph 02/18/2018 FINDINGS: Sternotomy wires overlie normal cardiac silhouette. Normal pulmonary vasculature. No effusion, infiltrate, or pneumothorax. No acute osseous abnormality. IMPRESSION: No acute cardiopulmonary process. Electronically Signed   By: Genevive Bi M.D.   On: 03/12/2019 16:48

## 2019-03-24 NOTE — Progress Notes (Signed)
Inpatient Diabetes Program Recommendations  AACE/ADA: New Consensus Statement on Inpatient Glycemic Control  Target Ranges:  Prepandial:   less than 140 mg/dL      Peak postprandial:   less than 180 mg/dL (1-2 hours)      Critically ill patients:  140 - 180 mg/dL   Results for Kevin Ballard, Kevin Ballard (MRN [id number]) as of 03/24/2019 08:22  Ref. Range 03/23/2019 07:53 03/23/2019 11:40 03/23/2019 16:19 03/23/2019 20:49 03/24/2019 08:07  Glucose-Capillary Latest Ref Range: 70 - 99 mg/dL 325 (H) 393 (H) 359 (H) 273 (H) 345 (H)   Review of Glycemic Control  Diabetes history: DM2 Outpatient Diabetes medications: Lantus 30 units daily, Metformin 1000 mg daily Current orders for Inpatient glycemic control: Lantus 45 units QHS, Novolog 0-20 units TID with meals, Novolog 6 units TID with meals; Solumedrol 60 mg Q12H  Inpatient Diabetes Program Recommendations:    Insulin: If steroids are continued as ordered, please consider increasing Lantus to 50 units QHS increasing meal coverage to Novolog 10 units TID with melas, and adding Novolog 0-5 units QHS for bedtime correction.  Thanks, Barnie Alderman, RN, MSN, CDE Diabetes Coordinator Inpatient Diabetes Program [phone] (Team Pager from 8am to 5pm)

## 2019-03-25 LAB — CBC WITH DIFFERENTIAL/PLATELET
Abs Immature Granulocytes: 0.1 10*3/uL — ABNORMAL HIGH (ref 0.00–0.07)
Basophils Absolute: 0 10*3/uL (ref 0.0–0.1)
Basophils Relative: 0 %
Eosinophils Absolute: 0 10*3/uL (ref 0.0–0.5)
Eosinophils Relative: 0 %
HCT: 34.2 % — ABNORMAL LOW (ref 39.0–52.0)
Hemoglobin: 11.6 g/dL — ABNORMAL LOW (ref 13.0–17.0)
Immature Granulocytes: 1 %
Lymphocytes Relative: 7 %
Lymphs Abs: 0.9 10*3/uL (ref 0.7–4.0)
MCH: 29.7 pg (ref 26.0–34.0)
MCHC: 33.9 g/dL (ref 30.0–36.0)
MCV: 87.7 fL (ref 80.0–100.0)
Monocytes Absolute: 0.4 10*3/uL (ref 0.1–1.0)
Monocytes Relative: 3 %
Neutro Abs: 11.8 10*3/uL — ABNORMAL HIGH (ref 1.7–7.7)
Neutrophils Relative %: 89 %
Platelets: 253 10*3/uL (ref 150–400)
RBC: 3.9 MIL/uL — ABNORMAL LOW (ref 4.22–5.81)
RDW: 13.3 % (ref 11.5–15.5)
WBC: 13.2 10*3/uL — ABNORMAL HIGH (ref 4.0–10.5)
nRBC: 0 % (ref 0.0–0.2)

## 2019-03-25 LAB — COMPREHENSIVE METABOLIC PANEL
ALT: 16 U/L (ref 0–44)
AST: 18 U/L (ref 15–41)
Albumin: 3 g/dL — ABNORMAL LOW (ref 3.5–5.0)
Alkaline Phosphatase: 53 U/L (ref 38–126)
Anion gap: 14 (ref 5–15)
BUN: 46 mg/dL — ABNORMAL HIGH (ref 8–23)
CO2: 20 mmol/L — ABNORMAL LOW (ref 22–32)
Calcium: 8 mg/dL — ABNORMAL LOW (ref 8.9–10.3)
Chloride: 100 mmol/L (ref 98–111)
Creatinine, Ser: 1.39 mg/dL — ABNORMAL HIGH (ref 0.61–1.24)
GFR calc Af Amer: >60 mL/min (ref >60)
GFR calc non Af Amer: 53 mL/min — ABNORMAL LOW (ref >60)
Glucose, Bld: 316 mg/dL — ABNORMAL HIGH (ref 70–99)
Potassium: 3.4 mmol/L — ABNORMAL LOW (ref 3.5–5.1)
Sodium: 134 mmol/L — ABNORMAL LOW (ref 135–145)
Total Bilirubin: 0.6 mg/dL (ref 0.3–1.2)
Total Protein: 6 g/dL — ABNORMAL LOW (ref 6.5–8.1)

## 2019-03-25 LAB — GLUCOSE, CAPILLARY
Glucose-Capillary: 301 mg/dL — ABNORMAL HIGH (ref 70–99)
Glucose-Capillary: 401 mg/dL — ABNORMAL HIGH (ref 70–99)
Glucose-Capillary: 358 mg/dL — ABNORMAL HIGH (ref 70–99)
Glucose-Capillary: 390 mg/dL — ABNORMAL HIGH (ref 70–99)

## 2019-03-25 LAB — FERRITIN: Ferritin: 135 ng/mL (ref 24–336)

## 2019-03-25 LAB — C-REACTIVE PROTEIN: CRP: 4 mg/dL — ABNORMAL HIGH (ref <1.0)

## 2019-03-25 LAB — D-DIMER, QUANTITATIVE: D-Dimer, Quant: 0.3 ug/mL-FEU (ref 0.00–0.50)

## 2019-03-25 MED ORDER — INSULIN GLARGINE 100 UNIT/ML ~~LOC~~ SOLN
62.0000 [IU] | Freq: Every day | SUBCUTANEOUS | Status: DC
  Administered 2019-03-25: 62 [IU] via SUBCUTANEOUS
  Filled 2019-03-25 (×2): qty 0.62

## 2019-03-25 MED ORDER — POTASSIUM CHLORIDE CRYS ER 20 MEQ PO TBCR
40.0000 meq | EXTENDED_RELEASE_TABLET | Freq: Once | ORAL | Status: AC
  Administered 2019-03-25: 40 meq via ORAL
  Filled 2019-03-25: qty 2

## 2019-03-25 MED ORDER — SENNOSIDES-DOCUSATE SODIUM 8.6-50 MG PO TABS
2.0000 | ORAL_TABLET | Freq: Every evening | ORAL | Status: DC | PRN
  Administered 2019-03-25 – 2019-03-26 (×2): 2 via ORAL
  Filled 2019-03-25 (×2): qty 2

## 2019-03-25 MED ORDER — INSULIN ASPART 100 UNIT/ML ~~LOC~~ SOLN
14.0000 [IU] | Freq: Three times a day (TID) | SUBCUTANEOUS | Status: DC
  Administered 2019-03-25 – 2019-03-26 (×4): 14 [IU] via SUBCUTANEOUS

## 2019-03-25 MED ORDER — METHYLPREDNISOLONE SODIUM SUCC 40 MG IJ SOLR
40.0000 mg | Freq: Two times a day (BID) | INTRAMUSCULAR | Status: DC
  Administered 2019-03-25: 40 mg via INTRAVENOUS
  Filled 2019-03-25 (×2): qty 1

## 2019-03-25 MED ORDER — POLYETHYLENE GLYCOL 3350 17 G PO PACK
17.0000 g | PACK | Freq: Every day | ORAL | Status: DC
  Administered 2019-03-25 – 2019-03-27 (×3): 17 g via ORAL
  Filled 2019-03-25 (×3): qty 1

## 2019-03-25 NOTE — Progress Notes (Signed)
PROGRESS NOTE                                                                                                                                                                                                             Patient Demographics:    Kevin Ballard, is a 63 y.o. male, DOB - 05/23/1956, ZOX:[id number]  Outpatient Primary MD for the patient is Patient, No Pcp Per   Admit date - 03/22/2019   LOS - 3  Chief Complaint  Patient presents with  . Shortness of Breath       Brief Narrative: Patient is a 63 y.o. male with PMHx of PAF, BPH, CAD with PCI 3 years back and CABG approximately 4 years back s/p CABG/PCI-who is currently incarcerated at [facility] [place]-presented to the hospital with acute hypoxemic respiratory failure secondary to COVID-19 pneumonia.  He was apparently diagnosed with COVID-19 on 9/25.  See below for further details.   Subjective:    Kevin Ballard feels much better-was titrated to room air this morning.   Assessment  & Plan :   Acute Hypoxic Resp Failure due to Covid 19 Viral pneumonia: Improved-titrated to room air this morning.  CRP now downtrending-taper steroids slightly-continue Remdesivir.    Fever: afebrile  O2 requirements: Titrated to room air this morning.  COVID-19 Labs: Recent Labs    03/22/19 1814 03/22/19 2129 03/24/19 0235 03/25/19 0135  DDIMER 0.27  --  0.32 0.30  FERRITIN 130 128 127 135  LDH 188  --   --   --   CRP 5.3* 5.4* 7.1* 4.0*    Lab Results  Component Value Date   SARSCOV2NAA POSITIVE (A) 03/12/2019     COVID-19 Medications: Steroids: 10/6>> Remdesivir: 10/7 Actemra: Not indicated Convalescent Plasma: Not indicated Research Studies:N/A  Other medications: Diuretics:Euvolemic-hold Lasix today-was given for the past 2 days. Antibiotics:Not needed as no evidence of bacterial infection  Prone/Incentive Spirometry: encouraged patient to lie prone for 3-4 hours at  a time for a total of 16 hours a day, and to encourage incentive spirometry use 3-4/hour.  DVT Prophylaxis  : Eliquis  PAF: Maintaining sinus rhythm-continue Coreg and Eliquis.  CAD: S/p PCI and CABG 2-3 years back-currently without any anginal symptoms.  Continue aspirin, beta-blocker and statin  CKD stage III: Creatinine close to usual baseline-follow.  Dyslipidemia: Continue statin  DM-2 (  A1c 8.4): CBGs remain uncontrolled as patient is on steroids.  Steroid being slowly tapered down-so CBG should improve over the next few days.  In the meantime-increase Lantus to 62 units nightly, increase NovoLog to 14 units with meals-and continue with SSI.  Follow.   CBG (last 3)  Recent Labs    03/24/19 1645 03/24/19 2053 03/25/19 0743  GLUCAP 325* 230* 301*   BPH: Continue finasteride and Flomax  Hypothyroidism: Continue levothyroxine  Nutrition Problem: Nutrition Problem: Increased nutrient needs Etiology: acute illness(COVID-19) Signs/Symptoms: estimated needs Interventions: Refer to RD note for recommendations  Obesity: Estimated body mass index is 36.89 kg/m as calculated from the following:   Height as of this encounter: 5\' 9"  (1.753 m).   Weight as of this encounter: 113.3 kg.   ABG: No results found for: PHART, PCO2ART, PO2ART, HCO3, TCO2, ACIDBASEDEF, O2SAT  Vent Settings: N/A    Condition - Stable  Family Communication  : None at bedside  Code Status :  Full Code  Diet :  Diet Order            Diet heart healthy/carb modified Room service appropriate? Yes; Fluid consistency: Thin  Diet effective now               Disposition Plan  :  Remain hospitalized-  Barriers to discharge:still hypoxic-needs 5 days of IV Remdesivir  Consults  :  None  Procedures  :  None  Antibiotics  :    Anti-infectives (From admission, onward)   Start     Dose/Rate Route Frequency Ordered Stop   03/24/19 1000  remdesivir 100 mg in sodium chloride 0.9 % 250 mL IVPB      100 mg 500 mL/hr over 30 Minutes Intravenous Every 24 hours 03/23/19 0923 03/28/19 0959   03/23/19 1000  azithromycin (ZITHROMAX) tablet 250 mg  Status:  Discontinued    Note to Pharmacy: Take as directed per package instructions starting on 03/22/2019. Take 2 tablets (500mg  total) on day 1, then take 1 tablet (250mg  total) on days 2 through 5.     250 mg Oral Daily 03/23/19 0301 03/23/19 0730   03/23/19 1000  remdesivir 200 mg in sodium chloride 0.9 % 250 mL IVPB     200 mg 500 mL/hr over 30 Minutes Intravenous Once 03/23/19 0923 03/23/19 1407      Inpatient Medications  Scheduled Meds: . amLODipine  10 mg Oral Daily  . apixaban  5 mg Oral BID  . aspirin EC  81 mg Oral Daily  . atorvastatin  80 mg Oral QHS  . carvedilol  6.25 mg Oral BID WC  . finasteride  5 mg Oral Daily  . [START ON 03/29/2019] influenza vac split quadrivalent PF  0.5 mL Intramuscular Tomorrow-1000  . insulin aspart  0-20 Units Subcutaneous TID WC  . insulin aspart  14 Units Subcutaneous TID WC  . insulin glargine  62 Units Subcutaneous QHS  . Ipratropium-Albuterol  2 puff Inhalation Q6H  . isosorbide mononitrate  30 mg Oral QHS  . levothyroxine  75 mcg Oral QAC breakfast  . methylPREDNISolone (SOLU-MEDROL) injection  40 mg Intravenous Q12H  . multivitamin with minerals  1 tablet Oral Daily  . pantoprazole  40 mg Oral Daily  . Ensure Max Protein  11 oz Oral BID  . ranolazine  1,000 mg Oral BID  . tamsulosin  0.4 mg Oral Daily  . vitamin C  500 mg Oral Daily  . zinc sulfate  220 mg Oral Daily  Continuous Infusions: . remdesivir 100 mg in NS 250 mL 100 mg (03/25/19 1005)   PRN Meds:.acetaminophen, chlorpheniramine-HYDROcodone, guaiFENesin-dextromethorphan, nitroGLYCERIN, prochlorperazine   Time Spent in minutes  25  See all Orders from today for further details   Oren Binet M.D on 03/25/2019 at 11:25 AM  To page go to [email or web address] - use universal password  [facility] -  Office   [phone]    Objective:   Vitals:   03/25/19 0600 03/25/19 0700 03/25/19 0845 03/25/19 0900  BP:   124/61 124/61  Pulse: 72 73  80  Resp: (!) 22 19  (!) 30  Temp:    98 F (36.7 C)  TempSrc:    Oral  SpO2: (!) 89% 93% 94% 94%  Weight:      Height:        Wt Readings from Last 3 Encounters:  03/22/19 113.3 kg  03/17/19 113.4 kg  03/12/19 113.4 kg     Intake/Output Summary (Last 24 hours) at 03/25/2019 1125 Last data filed at 03/25/2019 0900 Gross per 24 hour  Intake 680 ml  Output 1600 ml  Net -920 ml     Physical Exam Gen Exam:Alert awake-not in any distress HEENT:atraumatic, normocephalic Chest: B/L clear to auscultation anteriorly CVS:S1S2 regular Abdomen:soft non tender, non distended Extremities:no edema Neurology: Non focal Skin: no rash  Data Review:    CBC Recent Labs  Lab 03/22/19 1814 03/23/19 0500 03/24/19 0235 03/25/19 0135  WBC 10.2 7.0 12.7* 13.2*  HGB 13.1 12.5* 11.9* 11.6*  HCT 38.4* 37.5* 35.7* 34.2*  PLT 233 228 212 253  MCV 88.7 89.7 89.7 87.7  MCH 30.3 29.9 29.9 29.7  MCHC 34.1 33.3 33.3 33.9  RDW 13.4 13.7 13.5 13.3  LYMPHSABS 0.6* 0.6* 0.8 0.9  MONOABS 0.4 0.1 0.4 0.4  EOSABS 0.0 0.0 0.0 0.0  BASOSABS 0.0 0.0 0.0 0.0    Chemistries  Recent Labs  Lab 03/22/19 1814 03/23/19 0500 03/24/19 0235 03/25/19 0135  NA 132* 135 135 134*  K 3.2* 4.2 3.7 3.4*  CL 103 105 101 100  CO2 19* 16* 19* 20*  GLUCOSE 187* 333* 358* 316*  BUN 19 21 37* 46*  CREATININE 1.07 1.10 1.23 1.39*  CALCIUM 7.9* 8.1* 8.0* 8.0*  MG 1.7  --   --   --   AST 22 19 16 18   ALT 16 14 14 16   ALKPHOS 54 63 54 53  BILITOT 0.8 0.7 0.5 0.6   ------------------------------------------------------------------------------------------------------------------ Recent Labs    03/22/19 1814  TRIG 189*    Lab Results  Component Value Date   HGBA1C 8.4 (H) 03/22/2019    ------------------------------------------------------------------------------------------------------------------ No results for input(s): TSH, T4TOTAL, T3FREE, THYROIDAB in the last 72 hours.  Invalid input(s): FREET3 ------------------------------------------------------------------------------------------------------------------ Recent Labs    03/24/19 0235 03/25/19 0135  FERRITIN 127 135    Coagulation profile No results for input(s): INR, PROTIME in the last 168 hours.  Recent Labs    03/24/19 0235 03/25/19 0135  DDIMER 0.32 0.30    Cardiac Enzymes No results for input(s): CKMB, TROPONINI, MYOGLOBIN in the last 168 hours.  Invalid input(s): CK ------------------------------------------------------------------------------------------------------------------    Component Value Date/Time   BNP 159.0 (H) 03/22/2019 1814    Micro Results Recent Results (from the past 240 hour(s))  Blood Culture (routine x 2)     Status: None (Preliminary result)   Collection Time: 03/22/19  6:14 PM   Specimen: Right Antecubital; Blood  Result Value Ref Range Status   Specimen Description RIGHT  ANTECUBITAL  Final   Special Requests   Final    BOTTLES DRAWN AEROBIC AND ANAEROBIC Blood Culture adequate volume   Culture   Final    NO GROWTH 3 DAYS Performed at [facility], [address] [place], [place] [address]    Report Status PENDING  Incomplete  Blood Culture (routine x 2)     Status: None (Preliminary result)   Collection Time: 03/22/19  6:40 PM   Specimen: BLOOD LEFT HAND  Result Value Ref Range Status   Specimen Description BLOOD LEFT HAND  Final   Special Requests   Final    BOTTLES DRAWN AEROBIC AND ANAEROBIC Blood Culture adequate volume   Culture   Final    NO GROWTH 3 DAYS Performed at [facility], [address] [place], [place] [address]    Report Status PENDING  Incomplete  MRSA PCR Screening     Status: None   Collection Time: 03/23/19  9:40 AM   Specimen:  Nasal Mucosa; Nasopharyngeal  Result Value Ref Range Status   MRSA by PCR NEGATIVE NEGATIVE Final    Comment:        The GeneXpert MRSA Assay (FDA approved for NASAL specimens only), is one component of a comprehensive MRSA colonization surveillance program. It is not intended to diagnose MRSA infection nor to guide or monitor treatment for MRSA infections. Performed at [facility], [address] [place], [place] [address]     Radiology Reports Dg Chest Port 1 View  Result Date: 03/22/2019 CLINICAL DATA:  Hypoxia. Shortness of breath. Cough. COVID-19 viral infection. EXAM: PORTABLE CHEST 1 VIEW COMPARISON:  03/20/2019 FINDINGS: Heart size remains within normal limits. Previous median sternotomy. Both lungs are clear. No evidence of pleural effusion. Several old right rib fracture deformities are again noted. IMPRESSION: Stable exam.  No active disease. Electronically Signed   By: Danae OrleansJohn A Stahl M.D.   On: 03/22/2019 19:27   Dg Chest Port 1 View  Result Date: 03/12/2019 CLINICAL DATA:  Nurse notes:Pt has a Covid pt on his prison [facility] CP since last night Shortness of breath since this morning NTG x 3 without relief SR on monitor on the way in EMS reports pt had fever 100.4 last night. Reports chest pai.*comment was truncated*chest pain covid exposure EXAM: PORTABLE CHEST 1 VIEW COMPARISON:  Radiograph 02/18/2018 FINDINGS: Sternotomy wires overlie normal cardiac silhouette. Normal pulmonary vasculature. No effusion, infiltrate, or pneumothorax. No acute osseous abnormality. IMPRESSION: No acute cardiopulmonary process. Electronically Signed   By: Genevive BiStewart  Edmunds M.D.   On: 03/12/2019 16:48

## 2019-03-25 NOTE — Progress Notes (Signed)
Physical Therapy Treatment Patient Details Name: Torrian Canion MRN: [id number] DOB: 04-15-1956 Today's Date: 03/25/2019    History of Present Illness 63 y/o male w/ hx of DM, CAD, MI, A-Fib, HTN, HLD, GERD, BPH, L heart cath w/ grafts, lumbar fusion, MCL repair, tibiastomy. dx w/ covid 09/25 return to hospital 10/6 w/ worse sx. Pt currently incarcerated, but has been seen in ER multi times.    PT Comments    Pt was able to tolerate more activity today with less assistance and was also able to complete tx on room air. Completed bed mob and transfers with mod I, SBA for ambulation in hall x 276ft w/ no AD and on room air. Managed to remain in low 90s throughout ambulation monitored via pedi finger probe, desat to 89% once completed but was able to recover to low 90s very quickly.    Follow Up Recommendations  No PT follow up     Equipment Recommendations  None recommended by PT    Recommendations for Other Services       Precautions / Restrictions Precautions Precautions: Fall;Other (comment) Restrictions Weight Bearing Restrictions: No    Mobility  Bed Mobility Overal bed mobility: Modified Independent                Transfers Overall transfer level: Modified independent Equipment used: None                Ambulation/Gait Ambulation/Gait assistance: Supervision Gait Distance (Feet): 250 Feet Assistive device: None Gait Pattern/deviations: Step-through pattern     General Gait Details: slow cadenced no standing breaks, able to maintain sats in low 90s on room air via finger probe   Stairs             Wheelchair Mobility    Modified Rankin (Stroke Patients Only)       Balance Overall balance assessment: Modified Independent                                          Cognition Arousal/Alertness: Awake/alert Behavior During Therapy: WFL for tasks assessed/performed Overall Cognitive Status: Within Functional Limits for tasks  assessed                                        Exercises Other Exercises Other Exercises: reinforced use of flutter valve with cues completed 3x    General Comments General comments (skin integrity, edema, etc.): Pt able to ambulate 235ft with no AD, SBA and line management help, on room air able to maintain sats in low 90s while ambulating. once in room and seated desat to 89% but was able to quickly recover back to low 90s.       Pertinent Vitals/Pain      Home Living                      Prior Function            PT Goals (current goals can now be found in the care plan section) Acute Rehab PT Goals Patient Stated Goal: get better and return to camp, has transfer to winter camp coming up PT Goal Formulation: With patient Time For Goal Achievement: 04/06/19 Potential to Achieve Goals: Good    Frequency    Min 3X/week  PT Plan      Co-evaluation              AM-PAC PT "6 Clicks" Mobility   Outcome Measure  Help needed turning from your back to your side while in a flat bed without using bedrails?: None Help needed moving from lying on your back to sitting on the side of a flat bed without using bedrails?: None Help needed moving to and from a bed to a chair (including a wheelchair)?: None Help needed standing up from a chair using your arms (e.g., wheelchair or bedside chair)?: None Help needed to walk in hospital room?: A Little Help needed climbing 3-5 steps with a railing? : A Little 6 Click Score: 22    End of Session   Activity Tolerance: Treatment limited secondary to medical complications (Comment) Patient left: in chair;with call bell/phone within reach;with nursing/sitter in room(guard in room)   PT Visit Diagnosis: Other abnormalities of gait and mobility (R26.89)     Time: 8850-2774 PT Time Calculation (min) (ACUTE ONLY): 18 min  Charges:  $Gait Training: 8-22 mins                     Horald Chestnut,  PT    Delford Field 03/25/2019, 5:20 PM

## 2019-03-26 LAB — COMPREHENSIVE METABOLIC PANEL
ALT: 18 U/L (ref 0–44)
AST: 20 U/L (ref 15–41)
Albumin: 3 g/dL — ABNORMAL LOW (ref 3.5–5.0)
Alkaline Phosphatase: 54 U/L (ref 38–126)
Anion gap: 12 (ref 5–15)
BUN: 39 mg/dL — ABNORMAL HIGH (ref 8–23)
CO2: 21 mmol/L — ABNORMAL LOW (ref 22–32)
Calcium: 8.2 mg/dL — ABNORMAL LOW (ref 8.9–10.3)
Chloride: 101 mmol/L (ref 98–111)
Creatinine, Ser: 1.12 mg/dL (ref 0.61–1.24)
GFR calc Af Amer: >60 mL/min (ref >60)
GFR calc non Af Amer: >60 mL/min (ref >60)
Glucose, Bld: 317 mg/dL — ABNORMAL HIGH (ref 70–99)
Potassium: 4.3 mmol/L (ref 3.5–5.1)
Sodium: 134 mmol/L — ABNORMAL LOW (ref 135–145)
Total Bilirubin: 0.8 mg/dL (ref 0.3–1.2)
Total Protein: 6 g/dL — ABNORMAL LOW (ref 6.5–8.1)

## 2019-03-26 LAB — D-DIMER, QUANTITATIVE: D-Dimer, Quant: 0.31 ug/mL-FEU (ref 0.00–0.50)

## 2019-03-26 LAB — CBC WITH DIFFERENTIAL/PLATELET
Abs Immature Granulocytes: 0.23 10*3/uL — ABNORMAL HIGH (ref 0.00–0.07)
Basophils Absolute: 0.1 10*3/uL (ref 0.0–0.1)
Basophils Relative: 0 %
Eosinophils Absolute: 0 10*3/uL (ref 0.0–0.5)
Eosinophils Relative: 0 %
HCT: 34.8 % — ABNORMAL LOW (ref 39.0–52.0)
Hemoglobin: 11.8 g/dL — ABNORMAL LOW (ref 13.0–17.0)
Immature Granulocytes: 2 %
Lymphocytes Relative: 9 %
Lymphs Abs: 1.1 10*3/uL (ref 0.7–4.0)
MCH: 29.9 pg (ref 26.0–34.0)
MCHC: 33.9 g/dL (ref 30.0–36.0)
MCV: 88.3 fL (ref 80.0–100.0)
Monocytes Absolute: 0.7 10*3/uL (ref 0.1–1.0)
Monocytes Relative: 5 %
Neutro Abs: 10.3 10*3/uL — ABNORMAL HIGH (ref 1.7–7.7)
Neutrophils Relative %: 84 %
Platelets: 287 10*3/uL (ref 150–400)
RBC: 3.94 MIL/uL — ABNORMAL LOW (ref 4.22–5.81)
RDW: 13.3 % (ref 11.5–15.5)
WBC: 12.4 10*3/uL — ABNORMAL HIGH (ref 4.0–10.5)
nRBC: 0 % (ref 0.0–0.2)

## 2019-03-26 LAB — GLUCOSE, CAPILLARY
Glucose-Capillary: 251 mg/dL — ABNORMAL HIGH (ref 70–99)
Glucose-Capillary: 399 mg/dL — ABNORMAL HIGH (ref 70–99)
Glucose-Capillary: 384 mg/dL — ABNORMAL HIGH (ref 70–99)
Glucose-Capillary: 389 mg/dL — ABNORMAL HIGH (ref 70–99)

## 2019-03-26 LAB — FERRITIN: Ferritin: 142 ng/mL (ref 24–336)

## 2019-03-26 LAB — C-REACTIVE PROTEIN: CRP: 1.9 mg/dL — ABNORMAL HIGH (ref <1.0)

## 2019-03-26 MED ORDER — INSULIN ASPART 100 UNIT/ML ~~LOC~~ SOLN
0.0000 [IU] | Freq: Every day | SUBCUTANEOUS | Status: DC
  Administered 2019-03-26: 5 [IU] via SUBCUTANEOUS
  Administered 2019-03-27: 3 [IU] via SUBCUTANEOUS

## 2019-03-26 MED ORDER — INSULIN GLARGINE 100 UNIT/ML ~~LOC~~ SOLN
66.0000 [IU] | Freq: Every day | SUBCUTANEOUS | Status: DC
  Administered 2019-03-26: 66 [IU] via SUBCUTANEOUS
  Filled 2019-03-26: qty 0.66

## 2019-03-26 MED ORDER — PREDNISONE 20 MG PO TABS
40.0000 mg | ORAL_TABLET | Freq: Every day | ORAL | Status: DC
  Administered 2019-03-26: 40 mg via ORAL
  Filled 2019-03-26 (×2): qty 2

## 2019-03-26 MED ORDER — INSULIN ASPART 100 UNIT/ML ~~LOC~~ SOLN
16.0000 [IU] | Freq: Three times a day (TID) | SUBCUTANEOUS | Status: DC
  Administered 2019-03-26 – 2019-03-27 (×2): 16 [IU] via SUBCUTANEOUS

## 2019-03-26 MED ORDER — FUROSEMIDE 10 MG/ML IJ SOLN
40.0000 mg | Freq: Once | INTRAMUSCULAR | Status: AC
  Administered 2019-03-26: 40 mg via INTRAVENOUS
  Filled 2019-03-26: qty 4

## 2019-03-26 MED ORDER — INSULIN ASPART 100 UNIT/ML ~~LOC~~ SOLN: 0.0000 [IU] | Freq: Three times a day (TID) | SUBCUTANEOUS | Status: DC

## 2019-03-26 NOTE — Plan of Care (Signed)
Pt resting comfortably after moving back to the bed from the chair. The pt denies pain and shortness of breath. The pt has been constipated so stool softeners are being given routinely.

## 2019-03-26 NOTE — Progress Notes (Signed)
PROGRESS NOTE                                                                                                                                                                                                             Patient Demographics:    Kevin Ballard, is a 63 y.o. male, DOB - Dec 30, 1955, ZOX:[id number]  Outpatient Primary MD for the patient is Patient, No Pcp Per   Admit date - 03/22/2019   LOS - 4  Chief Complaint  Patient presents with  . Shortness of Breath       Brief Narrative: Patient is a 63 y.o. male with PMHx of PAF, BPH, CAD with PCI 3 years back and CABG approximately 4 years back s/p CABG/PCI-[facility]-presented to the hospital with acute hypoxemic respiratory failure secondary to COVID-19 pneumonia.  He was apparently diagnosed with COVID-19 on 9/25.  See below for further details.   Subjective:    Kevin CzarJohn Ballard feels much better-remains on room air.  Walked with physical therapy yesterday in the hallway-only desatted to 89% x 1 but recovered very quickly.  Patient acknowledges significant clinical improvement compared to how he first presented to the hospital   Assessment  & Plan :   Acute Hypoxic Resp Failure due to Covid 19 Viral pneumonia: Improved-CRP continues to downtrend-we will switch to prednisone-continue Remdesivir (last dose on 10/11).   Fever: afebrile  O2 requirements: Room air (titrated to room air on 10/9)  COVID-19 Labs: Recent Labs    03/24/19 0235 03/25/19 0135 03/26/19 0324  DDIMER 0.32 0.30 0.31  FERRITIN 127 135 142  CRP 7.1* 4.0* 1.9*    Lab Results  Component Value Date   SARSCOV2NAA POSITIVE (A) 03/12/2019     COVID-19 Medications: Steroids: 10/6>> Remdesivir: 10/7>> Actemra: Not indicated Convalescent Plasma: Not indicated Research Studies:N/A  Other medications: Diuretics:Euvolemic-repeat Lasix today-in an attempt to keep  negative balance. Antibiotics:Not needed as no evidence of bacterial infection  Prone/Incentive Spirometry: encouraged patient to lie prone for 3-4 hours at a time for a total of 16 hours a day, and to encourage incentive spirometry use 3-4/hour.  DVT Prophylaxis  : Eliquis  PAF: Maintaining sinus rhythm-continue Coreg and Eliquis.  CAD: S/p PCI and CABG 2-3 years back-currently without any anginal symptoms.  Continue aspirin, beta-blocker and statin  CKD stage  III: Creatinine close to usual baseline-follow.  Dyslipidemia: Continue statin  DM-2 (A1c 8.4): CBGs remain uncontrolled as patient is on steroids.  Steroid dosage is being slowly tapered down-so anticipate improvement in CBGs over the next few days.  In the meantime we will increase Lantus slightly to 66 units, increase pre-meal NovoLog to 16 units-continue with SSI.  CBG (last 3)  Recent Labs    03/25/19 2116 03/26/19 0746 03/26/19 1140  GLUCAP 390* 251* 399*   BPH: Continue finasteride and Flomax  Hypothyroidism: Continue levothyroxine  Nutrition Problem: Nutrition Problem: Increased nutrient needs Etiology: acute illness(COVID-19) Signs/Symptoms: estimated needs Interventions: Refer to RD note for recommendations  Obesity: Estimated body mass index is 36.89 kg/m as calculated from the following:   Height as of this encounter: 5\' 9"  (1.753 m).   Weight as of this encounter: 113.3 kg.   ABG: No results found for: PHART, PCO2ART, PO2ART, HCO3, TCO2, ACIDBASEDEF, O2SAT  Vent Settings: N/A    Condition - Stable  Family Communication  : None at bedside  Code Status :  Full Code  Diet :  Diet Order            Diet heart healthy/carb modified Room service appropriate? Yes; Fluid consistency: Thin  Diet effective now               Disposition Plan  :  Remain hospitalized-back to incarcerated facility on 10/11 or 10/12.  Barriers to discharge: Remdesivir x5 days  Consults  :  None  Procedures  :   None  Antibiotics  :    Anti-infectives (From admission, onward)   Start     Dose/Rate Route Frequency Ordered Stop   03/24/19 1000  remdesivir 100 mg in sodium chloride 0.9 % 250 mL IVPB     100 mg 500 mL/hr over 30 Minutes Intravenous Every 24 hours 03/23/19 0923 03/28/19 0959   03/23/19 1000  azithromycin (ZITHROMAX) tablet 250 mg  Status:  Discontinued    Note to Pharmacy: Take as directed per package instructions starting on 03/22/2019. Take 2 tablets (500mg  total) on day 1, then take 1 tablet (250mg  total) on days 2 through 5.     250 mg Oral Daily 03/23/19 0301 03/23/19 0730   03/23/19 1000  remdesivir 200 mg in sodium chloride 0.9 % 250 mL IVPB     200 mg 500 mL/hr over 30 Minutes Intravenous Once 03/23/19 0923 03/23/19 1407      Inpatient Medications  Scheduled Meds: . amLODipine  10 mg Oral Daily  . apixaban  5 mg Oral BID  . aspirin EC  81 mg Oral Daily  . atorvastatin  80 mg Oral QHS  . carvedilol  6.25 mg Oral BID WC  . finasteride  5 mg Oral Daily  . [START ON 03/29/2019] influenza vac split quadrivalent PF  0.5 mL Intramuscular Tomorrow-1000  . insulin aspart  0-20 Units Subcutaneous TID WC  . insulin aspart  14 Units Subcutaneous TID WC  . insulin glargine  62 Units Subcutaneous QHS  . Ipratropium-Albuterol  2 puff Inhalation Q6H  . isosorbide mononitrate  30 mg Oral QHS  . levothyroxine  75 mcg Oral QAC breakfast  . multivitamin with minerals  1 tablet Oral Daily  . pantoprazole  40 mg Oral Daily  . polyethylene glycol  17 g Oral Daily  . predniSONE  40 mg Oral Q breakfast  . Ensure Max Protein  11 oz Oral BID  . ranolazine  1,000 mg Oral BID  .  tamsulosin  0.4 mg Oral Daily  . vitamin C  500 mg Oral Daily  . zinc sulfate  220 mg Oral Daily   Continuous Infusions: . remdesivir 100 mg in NS 250 mL 100 mg (03/26/19 0835)   PRN Meds:.acetaminophen, chlorpheniramine-HYDROcodone, guaiFENesin-dextromethorphan, nitroGLYCERIN, prochlorperazine, senna-docusate    Time Spent in minutes  25  See all Orders from today for further details   Jeoffrey Massed M.D on 03/26/2019 at 2:20 PM  To page go to [email or web address] - use universal password  [facility] -  Office  [phone]    Objective:   Vitals:   03/26/19 0500 03/26/19 0745 03/26/19 0839 03/26/19 1200  BP: (!) 102/41 (!) 131/56 (!) 131/56   Pulse: 84 73  80  Resp: 17 20  (!) 21  Temp: 98.4 F (36.9 C) 98.7 F (37.1 C)    TempSrc: Oral Oral    SpO2: 92% 91%  (!) 89%  Weight:      Height:        Wt Readings from Last 3 Encounters:  03/22/19 113.3 kg  03/17/19 113.4 kg  03/12/19 113.4 kg     Intake/Output Summary (Last 24 hours) at 03/26/2019 1420 Last data filed at 03/26/2019 1300 Gross per 24 hour  Intake 920 ml  Output 3825 ml  Net -2905 ml     Physical Exam Gen Exam:Alert awake-not in any distress HEENT:atraumatic, normocephalic Chest: B/L clear to auscultation anteriorly CVS:S1S2 regular Abdomen:soft non tender, non distended Extremities:no edema Neurology: Non focal Skin: no rash   Data Review:    CBC Recent Labs  Lab 03/22/19 1814 03/23/19 0500 03/24/19 0235 03/25/19 0135 03/26/19 0324  WBC 10.2 7.0 12.7* 13.2* 12.4*  HGB 13.1 12.5* 11.9* 11.6* 11.8*  HCT 38.4* 37.5* 35.7* 34.2* 34.8*  PLT 233 228 212 253 287  MCV 88.7 89.7 89.7 87.7 88.3  MCH 30.3 29.9 29.9 29.7 29.9  MCHC 34.1 33.3 33.3 33.9 33.9  RDW 13.4 13.7 13.5 13.3 13.3  LYMPHSABS 0.6* 0.6* 0.8 0.9 1.1  MONOABS 0.4 0.1 0.4 0.4 0.7  EOSABS 0.0 0.0 0.0 0.0 0.0  BASOSABS 0.0 0.0 0.0 0.0 0.1    Chemistries  Recent Labs  Lab 03/22/19 1814 03/23/19 0500 03/24/19 0235 03/25/19 0135 03/26/19 0324  NA 132* 135 135 134* 134*  K 3.2* 4.2 3.7 3.4* 4.3  CL 103 105 101 100 101  CO2 19* 16* 19* 20* 21*  GLUCOSE 187* 333* 358* 316* 317*  BUN 19 21 37* 46* 39*  CREATININE 1.07 1.10 1.23 1.39* 1.12  CALCIUM 7.9* 8.1* 8.0* 8.0* 8.2*  MG 1.7  --   --   --   --   AST ALT ALKPHOS 54 63 54 53 54  BILITOT 0.8 0.7 0.5 0.6 0.8   ------------------------------------------------------------------------------------------------------------------ No results for input(s): CHOL, HDL, LDLCALC, TRIG, CHOLHDL, LDLDIRECT in the last 72 hours.  Lab Results  Component Value Date   HGBA1C 8.4 (H) 03/22/2019   ------------------------------------------------------------------------------------------------------------------ No results for input(s): TSH, T4TOTAL, T3FREE, THYROIDAB in the last 72 hours.  Invalid input(s): FREET3 ------------------------------------------------------------------------------------------------------------------ Recent Labs    03/25/19 0135 03/26/19 0324  FERRITIN 135 142    Coagulation profile No results for input(s): INR, PROTIME in the last 168 hours.  Recent Labs    03/25/19 0135 03/26/19 0324  DDIMER 0.30 0.31    Cardiac Enzymes No results for input(s): CKMB, TROPONINI, MYOGLOBIN in the last 168 hours.  Invalid  input(s): CK ------------------------------------------------------------------------------------------------------------------    Component Value Date/Time   BNP 159.0 (H) 03/22/2019 1814    Micro Results Recent Results (from the past 240 hour(s))  Blood Culture (routine x 2)     Status: None (Preliminary result)   Collection Time: 03/22/19  6:14 PM   Specimen: Right Antecubital; Blood  Result Value Ref Range Status   Specimen Description RIGHT ANTECUBITAL  Final   Special Requests   Final    BOTTLES DRAWN AEROBIC AND ANAEROBIC Blood Culture adequate volume   Culture   Final    NO GROWTH 4 DAYS Performed at [facility], [address], [place], [place] [address]    Report Status PENDING  Incomplete  Blood Culture (routine x 2)     Status: None (Preliminary result)   Collection Time: 03/22/19  6:40 PM   Specimen: BLOOD LEFT HAND  Result Value Ref Range Status   Specimen Description  BLOOD LEFT HAND  Final   Special Requests   Final    BOTTLES DRAWN AEROBIC AND ANAEROBIC Blood Culture adequate volume   Culture   Final    NO GROWTH 4 DAYS Performed at [facility], [address] [place], [place] [address]    Report Status PENDING  Incomplete  MRSA PCR Screening     Status: None   Collection Time: 03/23/19  9:40 AM   Specimen: Nasal Mucosa; Nasopharyngeal  Result Value Ref Range Status   MRSA by PCR NEGATIVE NEGATIVE Final    Comment:        The GeneXpert MRSA Assay (FDA approved for NASAL specimens only), is one component of a comprehensive MRSA colonization surveillance program. It is not intended to diagnose MRSA infection nor to guide or monitor treatment for MRSA infections. Performed at [facility], [address], [place], [place] [address]     Radiology Reports Dg Chest Port 1 View  Result Date: 03/22/2019 CLINICAL DATA:  Hypoxia. Shortness of breath. Cough. COVID-19 viral infection. EXAM: PORTABLE CHEST 1 VIEW COMPARISON:  03/20/2019 FINDINGS: Heart size remains within normal limits. Previous median sternotomy. Both lungs are clear. No evidence of pleural effusion. Several old right rib fracture deformities are again noted. IMPRESSION: Stable exam.  No active disease. Electronically Signed   By: Danae Orleans M.D.   On: 03/22/2019 19:27   Dg Chest Port 1 View  Result Date: 03/12/2019 CLINICAL DATA:  Nurse notes:Pt has a Covid [facility] CP since last night Shortness of breath since this morning NTG x 3 without relief SR on monitor on the way in EMS reports pt had fever 100.4 last night. Reports chest pai.*comment was truncated*chest pain covid exposure EXAM: PORTABLE CHEST 1 VIEW COMPARISON:  Radiograph 02/18/2018 FINDINGS: Sternotomy wires overlie normal cardiac silhouette. Normal pulmonary vasculature. No effusion, infiltrate, or pneumothorax. No acute osseous abnormality. IMPRESSION: No acute cardiopulmonary process.  Electronically Signed   By: Genevive Bi M.D.   On: 03/12/2019 16:48

## 2019-03-26 NOTE — Progress Notes (Signed)
Sitting up in chair. Ambulates in room and sats stay low 90s on RA. No dyspnea or distress noted.

## 2019-03-26 NOTE — Progress Notes (Signed)
Assisted to walk 4 laps around the unit. Monitored oxygen sats on RA and sats dropped to 89% on 4th lap and he states he feels a little tired. Assisted back to chair and sats 93% on RA, HR 84-101.

## 2019-03-27 LAB — CBC WITH DIFFERENTIAL/PLATELET
Abs Immature Granulocytes: 0.54 10*3/uL — ABNORMAL HIGH (ref 0.00–0.07)
Basophils Absolute: 0.1 10*3/uL (ref 0.0–0.1)
Basophils Relative: 1 %
Eosinophils Absolute: 0 10*3/uL (ref 0.0–0.5)
Eosinophils Relative: 0 %
HCT: 38.1 % — ABNORMAL LOW (ref 39.0–52.0)
Hemoglobin: 12.9 g/dL — ABNORMAL LOW (ref 13.0–17.0)
Immature Granulocytes: 4 %
Lymphocytes Relative: 12 %
Lymphs Abs: 1.5 10*3/uL (ref 0.7–4.0)
MCH: 29.9 pg (ref 26.0–34.0)
MCHC: 33.9 g/dL (ref 30.0–36.0)
MCV: 88.2 fL (ref 80.0–100.0)
Monocytes Absolute: 1 10*3/uL (ref 0.1–1.0)
Monocytes Relative: 8 %
Neutro Abs: 9.6 10*3/uL — ABNORMAL HIGH (ref 1.7–7.7)
Neutrophils Relative %: 75 %
Platelets: 345 10*3/uL (ref 150–400)
RBC: 4.32 MIL/uL (ref 4.22–5.81)
RDW: 13.2 % (ref 11.5–15.5)
WBC: 12.7 10*3/uL — ABNORMAL HIGH (ref 4.0–10.5)
nRBC: 0.2 % (ref 0.0–0.2)

## 2019-03-27 LAB — GLUCOSE, CAPILLARY
Glucose-Capillary: 127 mg/dL — ABNORMAL HIGH (ref 70–99)
Glucose-Capillary: 249 mg/dL — ABNORMAL HIGH (ref 70–99)
Glucose-Capillary: 359 mg/dL — ABNORMAL HIGH (ref 70–99)
Glucose-Capillary: 253 mg/dL — ABNORMAL HIGH (ref 70–99)

## 2019-03-27 LAB — COMPREHENSIVE METABOLIC PANEL
ALT: 21 U/L (ref 0–44)
AST: 20 U/L (ref 15–41)
Albumin: 3 g/dL — ABNORMAL LOW (ref 3.5–5.0)
Alkaline Phosphatase: 56 U/L (ref 38–126)
Anion gap: 12 (ref 5–15)
BUN: 40 mg/dL — ABNORMAL HIGH (ref 8–23)
CO2: 22 mmol/L (ref 22–32)
Calcium: 8 mg/dL — ABNORMAL LOW (ref 8.9–10.3)
Chloride: 98 mmol/L (ref 98–111)
Creatinine, Ser: 1 mg/dL (ref 0.61–1.24)
GFR calc Af Amer: >60 mL/min (ref >60)
GFR calc non Af Amer: >60 mL/min (ref >60)
Glucose, Bld: 242 mg/dL — ABNORMAL HIGH (ref 70–99)
Potassium: 4 mmol/L (ref 3.5–5.1)
Sodium: 132 mmol/L — ABNORMAL LOW (ref 135–145)
Total Bilirubin: 0.6 mg/dL (ref 0.3–1.2)
Total Protein: 6 g/dL — ABNORMAL LOW (ref 6.5–8.1)

## 2019-03-27 LAB — FERRITIN: Ferritin: 136 ng/mL (ref 24–336)

## 2019-03-27 LAB — D-DIMER, QUANTITATIVE: D-Dimer, Quant: 0.3 ug/mL-FEU (ref 0.00–0.50)

## 2019-03-27 LAB — C-REACTIVE PROTEIN: CRP: 1.6 mg/dL — ABNORMAL HIGH (ref <1.0)

## 2019-03-27 MED ORDER — PREDNISONE 20 MG PO TABS
30.0000 mg | ORAL_TABLET | Freq: Every day | ORAL | Status: DC
  Administered 2019-03-27 – 2019-03-28 (×2): 30 mg via ORAL
  Filled 2019-03-27 (×2): qty 1

## 2019-03-27 MED ORDER — INSULIN ASPART 100 UNIT/ML ~~LOC~~ SOLN
10.0000 [IU] | Freq: Three times a day (TID) | SUBCUTANEOUS | Status: DC
  Administered 2019-03-27 – 2019-03-28 (×3): 10 [IU] via SUBCUTANEOUS

## 2019-03-27 MED ORDER — SENNOSIDES-DOCUSATE SODIUM 8.6-50 MG PO TABS
1.0000 | ORAL_TABLET | Freq: Two times a day (BID) | ORAL | Status: DC
  Administered 2019-03-27 – 2019-03-28 (×3): 1 via ORAL
  Filled 2019-03-27 (×3): qty 1

## 2019-03-27 MED ORDER — POLYETHYLENE GLYCOL 3350 17 G PO PACK
17.0000 g | PACK | Freq: Two times a day (BID) | ORAL | Status: DC
  Administered 2019-03-27 – 2019-03-28 (×3): 17 g via ORAL
  Filled 2019-03-27 (×3): qty 1

## 2019-03-27 MED ORDER — INSULIN GLARGINE 100 UNIT/ML ~~LOC~~ SOLN
60.0000 [IU] | Freq: Every day | SUBCUTANEOUS | Status: DC
  Administered 2019-03-27: 60 [IU] via SUBCUTANEOUS
  Filled 2019-03-27: qty 0.6

## 2019-03-27 NOTE — Progress Notes (Signed)
PROGRESS NOTE                                                                                                                                                                                                             Patient Demographics:    Kevin Ballard, is a 63 y.o. male, DOB - 02/13/1956, OAC:[id number]  Outpatient Primary MD for the patient is Patient, No Pcp Per   Admit date - 03/22/2019   LOS - 5  Chief Complaint  Patient presents with  . Shortness of Breath       Brief Narrative: Patient is a 63 y.o. male with PMHx of PAF, BPH, CAD with PCI 3 years back and CABG approximately 4 years back s/p CABG/PCI-who is currently incarcerated at [facility]-presented to the hospital with acute hypoxemic respiratory failure secondary to COVID-19 pneumonia.  He was apparently diagnosed with COVID-19 on 9/25.  See below for further details.   Subjective:   Patient in bed, appears comfortable, denies any headache, no fever, no chest pain or pressure, no shortness of breath , no abdominal pain. No focal weakness.    Assessment  & Plan :   Acute Hypoxic Resp Failure due to Covid 19 Viral pneumonia:  He has been treated with combination of steroids along with Remdisvir with good effect, he is down to room air and relatively symptom-free, will finish his Remdisvir course, continue to titrate down steroids.  Increase activity.  Requested to sit up in chair in the daytime use flutter valve and I-S for pulmonary toiletry and prone in bed at night.  If clinically better discharge tomorrow.   COVID-19 Labs: Recent Labs    03/25/19 0135 03/26/19 0324 03/27/19 0105  DDIMER 0.30 0.31 0.30  FERRITIN 135 142 136  CRP 4.0* 1.9* 1.6*    Lab Results  Component Value Date   SARSCOV2NAA POSITIVE (A) 03/12/2019     COVID-19 Medications: Steroids: 10/6>> Remdesivir: 10/7>> Actemra: Not indicated Convalescent Plasma: Not indicated  Research Studies:N/A    PAF: Maintaining sinus rhythm-continue Coreg and Eliquis.  CAD: S/p PCI and CABG 2-3 years back-currently without any anginal symptoms.  Continue aspirin, beta-blocker and statin  CKD stage III: Creatinine close to usual baseline-follow.  Dyslipidemia: Continue statin  BPH: Continue finasteride and Flomax  Hypothyroidism: Continue levothyroxine  Obesity: BMI of 36 follow with PCP  for weight loss.   DM-2 (A1c 8.4): Currently on high-dose Lantus and sliding scale will be monitored closely, poor outpatient control due to hyperglycemia.  CBG (last 3)  Recent Labs    03/26/19 1625 03/26/19 2111 03/27/19 0719  GLUCAP 384* 389* 127*     DVT Prophylaxis  : Eliquis  Condition - Stable  Family Communication  : None at bedside  Code Status :  Full Code  Diet :  Diet Order            Diet heart healthy/carb modified Room service appropriate? Yes; Fluid consistency: Thin  Diet effective now               Disposition Plan  :  Remain hospitalized-back to incarcerated facility on 10/11 or 10/12.  Barriers to discharge: Remdesivir x5 days  Consults  :  None  Procedures  :  None  Antibiotics  :    Anti-infectives (From admission, onward)   Start     Dose/Rate Route Frequency Ordered Stop   03/24/19 1000  remdesivir 100 mg in sodium chloride 0.9 % 250 mL IVPB     100 mg 500 mL/hr over 30 Minutes Intravenous Every 24 hours 03/23/19 0923 03/27/19 1056   03/23/19 1000  azithromycin (ZITHROMAX) tablet 250 mg  Status:  Discontinued    Note to Pharmacy: Take as directed per package instructions starting on 03/22/2019. Take 2 tablets (500mg  total) on day 1, then take 1 tablet (250mg  total) on days 2 through 5.     250 mg Oral Daily 03/23/19 0301 03/23/19 0730   03/23/19 1000  remdesivir 200 mg in sodium chloride 0.9 % 250 mL IVPB     200 mg 500 mL/hr over 30 Minutes Intravenous Once 03/23/19 0923 03/23/19 1407      Inpatient Medications   Scheduled Meds: . amLODipine  10 mg Oral Daily  . apixaban  5 mg Oral BID  . aspirin EC  81 mg Oral Daily  . atorvastatin  80 mg Oral QHS  . carvedilol  6.25 mg Oral BID WC  . finasteride  5 mg Oral Daily  . [START ON 03/29/2019] influenza vac split quadrivalent PF  0.5 mL Intramuscular Tomorrow-1000  . insulin aspart  0-20 Units Subcutaneous TID WC  . insulin aspart  0-5 Units Subcutaneous QHS  . insulin aspart  16 Units Subcutaneous TID WC  . insulin glargine  66 Units Subcutaneous QHS  . Ipratropium-Albuterol  2 puff Inhalation Q6H  . isosorbide mononitrate  30 mg Oral QHS  . levothyroxine  75 mcg Oral QAC breakfast  . multivitamin with minerals  1 tablet Oral Daily  . pantoprazole  40 mg Oral Daily  . polyethylene glycol  17 g Oral Daily  . predniSONE  30 mg Oral Q breakfast  . Ensure Max Protein  11 oz Oral BID  . ranolazine  1,000 mg Oral BID  . tamsulosin  0.4 mg Oral Daily  . vitamin C  500 mg Oral Daily  . zinc sulfate  220 mg Oral Daily   Continuous Infusions:  PRN Meds:.acetaminophen, chlorpheniramine-HYDROcodone, guaiFENesin-dextromethorphan, nitroGLYCERIN, prochlorperazine, senna-docusate   Time Spent in minutes  25  See all Orders from today for further details   Susa RaringPrashant Singh M.D on 03/27/2019 at 10:57 AM  To page go to [email or web address] - use universal password  [facility] -  Office  [phone]    Objective:   Vitals:   03/26/19 1520 03/26/19 1930 03/27/19 0436 03/27/19 0720  BP: 130/64 Marland Kitchen(!)  106/57 (!) 97/33 131/65  Pulse: 87 80 71 79  Resp: 16 (!) Temp: 97.8 F (36.6 C) 98.7 F (37.1 C) 97.6 F (36.4 C) 98 F (36.7 C)  TempSrc: Oral Oral Oral Oral  SpO2: 93% 95% 92% 91%  Weight:      Height:        Wt Readings from Last 3 Encounters:  03/22/19 113.3 kg  03/17/19 113.4 kg  03/12/19 113.4 kg     Intake/Output Summary (Last 24 hours) at 03/27/2019 1057 Last data filed at 03/27/2019 0900 Gross per 24 hour  Intake 1160  ml  Output 2975 ml  Net -1815 ml     Physical Exam  Awake Alert, Oriented X 3, No new F.N deficits, Normal affect [place].AT,PERRAL Supple Neck,No JVD, No cervical lymphadenopathy appriciated.  Symmetrical Chest wall movement, Good air movement bilaterally, CTAB RRR,No Gallops, Rubs or new Murmurs, No Parasternal Heave +ve B.Sounds, Abd Soft, No tenderness, No organomegaly appriciated, No rebound - guarding or rigidity. No Cyanosis, Clubbing or edema, No new Rash or bruise    Data Review:    CBC Recent Labs  Lab 03/23/19 0500 03/24/19 0235 03/25/19 0135 03/26/19 0324 03/27/19 0105  WBC 7.0 12.7* 13.2* 12.4* 12.7*  HGB 12.5* 11.9* 11.6* 11.8* 12.9*  HCT 37.5* 35.7* 34.2* 34.8* 38.1*  PLT 228 212 253 287 345  MCV 89.7 89.7 87.7 88.3 88.2  MCH 29.9 29.9 29.7 29.9 29.9  MCHC 33.3 33.3 33.9 33.9 33.9  RDW 13.7 13.5 13.3 13.3 13.2  LYMPHSABS 0.6* 0.8 0.9 1.1 1.5  MONOABS 0.1 0.4 0.4 0.7 1.0  EOSABS 0.0 0.0 0.0 0.0 0.0  BASOSABS 0.0 0.0 0.0 0.1 0.1    Chemistries  Recent Labs  Lab 03/22/19 1814 03/23/19 0500 03/24/19 0235 03/25/19 0135 03/26/19 0324 03/27/19 0105  NA 132* 135 135 134* 134* 132*  K 3.2* 4.2 3.7 3.4* 4.3 4.0  CL 103 105 101 100 101 98  CO2 19* 16* 19* 20* 21* 22  GLUCOSE 187* 333* 358* 316* 317* 242*  BUN 19 21 37* 46* 39* 40*  CREATININE 1.07 1.10 1.23 1.39* 1.12 1.00  CALCIUM 7.9* 8.1* 8.0* 8.0* 8.2* 8.0*  MG 1.7  --   --   --   --   --   AST ALT ALKPHOS 54 63 54 53 54 56  BILITOT 0.8 0.7 0.5 0.6 0.8 0.6   ------------------------------------------------------------------------------------------------------------------ No results for input(s): CHOL, HDL, LDLCALC, TRIG, CHOLHDL, LDLDIRECT in the last 72 hours.  Lab Results  Component Value Date   HGBA1C 8.4 (H) 03/22/2019   ------------------------------------------------------------------------------------------------------------------ No results for  input(s): TSH, T4TOTAL, T3FREE, THYROIDAB in the last 72 hours.  Invalid input(s): FREET3 ------------------------------------------------------------------------------------------------------------------ Recent Labs    03/26/19 0324 03/27/19 0105  FERRITIN 142 136    Coagulation profile No results for input(s): INR, PROTIME in the last 168 hours.  Recent Labs    03/26/19 0324 03/27/19 0105  DDIMER 0.31 0.30    Cardiac Enzymes No results for input(s): CKMB, TROPONINI, MYOGLOBIN in the last 168 hours.  Invalid input(s): CK ------------------------------------------------------------------------------------------------------------------    Component Value Date/Time   BNP 159.0 (H) 03/22/2019 1814    Micro Results Recent Results (from the past 240 hour(s))  Blood Culture (routine x 2)     Status: None (Preliminary result)   Collection Time: 03/22/19  6:14 PM   Specimen: Right Antecubital; Blood  Result  Value Ref Range Status   Specimen Description RIGHT ANTECUBITAL  Final   Special Requests   Final    BOTTLES DRAWN AEROBIC AND ANAEROBIC Blood Culture adequate volume   Culture   Final    NO GROWTH 4 DAYS Performed at [facility], [address], [place], [place] [address]    Report Status PENDING  Incomplete  Blood Culture (routine x 2)     Status: None (Preliminary result)   Collection Time: 03/22/19  6:40 PM   Specimen: BLOOD LEFT HAND  Result Value Ref Range Status   Specimen Description BLOOD LEFT HAND  Final   Special Requests   Final    BOTTLES DRAWN AEROBIC AND ANAEROBIC Blood Culture adequate volume   Culture   Final    NO GROWTH 4 DAYS Performed at [facility], [address], [place], [place] [address]    Report Status PENDING  Incomplete  MRSA PCR Screening     Status: None   Collection Time: 03/23/19  9:40 AM   Specimen: Nasal Mucosa; Nasopharyngeal  Result Value Ref Range Status   MRSA by PCR NEGATIVE NEGATIVE Final    Comment:        The  GeneXpert MRSA Assay (FDA approved for NASAL specimens only), is one component of a comprehensive MRSA colonization surveillance program. It is not intended to diagnose MRSA infection nor to guide or monitor treatment for MRSA infections. Performed at [facility], [place] [address], [place], [place] [address]     Radiology Reports Dg Chest Port 1 View  Result Date: 03/22/2019 CLINICAL DATA:  Hypoxia. Shortness of breath. Cough. COVID-19 viral infection. EXAM: PORTABLE CHEST 1 VIEW COMPARISON:  03/20/2019 FINDINGS: Heart size remains within normal limits. Previous median sternotomy. Both lungs are clear. No evidence of pleural effusion. Several old right rib fracture deformities are again noted. IMPRESSION: Stable exam.  No active disease. Electronically Signed   By: Marlaine Hind M.D.   On: 03/22/2019 19:27   Dg Chest Port 1 View  Result Date: 03/12/2019 CLINICAL DATA:  Nurse notes:Pt has a Covid pt on his prison unit CP since last night Shortness of breath since this morning NTG x 3 without relief SR on monitor on the way in EMS reports pt had fever 100.4 last night. Reports chest pai.*comment was truncated*chest pain covid exposure EXAM: PORTABLE CHEST 1 VIEW COMPARISON:  Radiograph 02/18/2018 FINDINGS: Sternotomy wires overlie normal cardiac silhouette. Normal pulmonary vasculature. No effusion, infiltrate, or pneumothorax. No acute osseous abnormality. IMPRESSION: No acute cardiopulmonary process. Electronically Signed   By: Suzy Bouchard M.D.   On: 03/12/2019 16:48

## 2019-03-28 LAB — COMPREHENSIVE METABOLIC PANEL
ALT: 26 U/L (ref 0–44)
AST: 20 U/L (ref 15–41)
Albumin: 2.7 g/dL — ABNORMAL LOW (ref 3.5–5.0)
Alkaline Phosphatase: 61 U/L (ref 38–126)
Anion gap: 10 (ref 5–15)
BUN: 34 mg/dL — ABNORMAL HIGH (ref 8–23)
CO2: 25 mmol/L (ref 22–32)
Calcium: 8.4 mg/dL — ABNORMAL LOW (ref 8.9–10.3)
Chloride: 100 mmol/L (ref 98–111)
Creatinine, Ser: 1.02 mg/dL (ref 0.61–1.24)
GFR calc Af Amer: >60 mL/min (ref >60)
GFR calc non Af Amer: >60 mL/min (ref >60)
Glucose, Bld: 259 mg/dL — ABNORMAL HIGH (ref 70–99)
Potassium: 4 mmol/L (ref 3.5–5.1)
Sodium: 135 mmol/L (ref 135–145)
Total Bilirubin: 0.5 mg/dL (ref 0.3–1.2)
Total Protein: 5.5 g/dL — ABNORMAL LOW (ref 6.5–8.1)

## 2019-03-28 LAB — GLUCOSE, CAPILLARY: Glucose-Capillary: 173 mg/dL — ABNORMAL HIGH (ref 70–99)

## 2019-03-28 LAB — CULTURE, BLOOD (ROUTINE X 2)
Culture: NO GROWTH
Culture: NO GROWTH
Special Requests: ADEQUATE
Special Requests: ADEQUATE

## 2019-03-28 LAB — MAGNESIUM: Magnesium: 2.1 mg/dL (ref 1.7–2.4)

## 2019-03-28 MED ORDER — FUROSEMIDE 10 MG/ML IJ SOLN
40.0000 mg | Freq: Once | INTRAMUSCULAR | Status: AC
  Administered 2019-03-28: 40 mg via INTRAVENOUS
  Filled 2019-03-28: qty 4

## 2019-03-28 MED ORDER — MAGNESIUM HYDROXIDE 400 MG/5ML PO SUSP
30.0000 mL | Freq: Two times a day (BID) | ORAL | Status: DC
  Administered 2019-03-28: 30 mL via ORAL
  Filled 2019-03-28: qty 30

## 2019-03-28 NOTE — Plan of Care (Signed)
  Problem: Education: Goal: Knowledge of General Education information will improve Description: Including pain rating scale, medication(s)/side effects and non-pharmacologic comfort measures Outcome: Progressing   Problem: Health Behavior/Discharge Planning: Goal: Ability to manage health-related needs will improve Outcome: Progressing   Problem: Clinical Measurements: Goal: Ability to maintain clinical measurements within normal limits will improve Outcome: Progressing Goal: Will remain free from infection Outcome: Progressing Goal: Diagnostic test results will improve Outcome: Progressing Goal: Respiratory complications will improve Outcome: Progressing Goal: Cardiovascular complication will be avoided Outcome: Progressing   Problem: Activity: Goal: Risk for activity intolerance will decrease Outcome: Progressing   Problem: Nutrition: Goal: Adequate nutrition will be maintained Outcome: Progressing   Problem: Elimination: Goal: Will not experience complications related to bowel motility Outcome: Progressing Goal: Will not experience complications related to urinary retention Outcome: Progressing   Problem: Pain Managment: Goal: General experience of comfort will improve Outcome: Progressing   Problem: Safety: Goal: Ability to remain free from injury will improve Outcome: Progressing   Problem: Skin Integrity: Goal: Risk for impaired skin integrity will decrease Outcome: Progressing   Problem: Education: Goal: Knowledge of risk factors and measures for prevention of condition will improve Outcome: Progressing   Problem: Coping: Goal: Psychosocial and spiritual needs will be supported Outcome: Progressing   Problem: Respiratory: Goal: Will maintain a patent airway Outcome: Progressing Goal: Complications related to the disease process, condition or treatment will be avoided or minimized Outcome: Progressing   

## 2019-03-28 NOTE — Discharge Summary (Signed)
Marliss CzarJohn Kemp ZOX:[id number] DOB: 1955/09/04 DOA: 03/22/2019  PCP: Patient, No Pcp Per  Admit date: 03/22/2019  Discharge date: 03/28/2019  Admitted From: Prison   Disposition:  Prison   Recommendations for Outpatient Follow-up:   Follow up with PCP in 1-2 weeks  PCP Please obtain BMP/CBC, 2 view CXR in 1week,  (see Discharge instructions)   PCP Please follow up on the following pending results:    Home Health: None   Equipment/Devices: None  Consultations: None  Discharge Condition: Stable    CODE STATUS: Full    Diet Recommendation: Heart Healthy Low Carb    Chief Complaint  Patient presents with  . Shortness of Breath     Brief history of present illness from the day of admission and additional interim summary    Patient is a 63 y.o. male with PMHx of PAF, BPH, CAD with PCI 3 years back and CABG approximately 4 years back s/p CABG/PCI-who is currently [facility] at [facility]-presented to the hospital with acute hypoxemic respiratory failure secondary to COVID-19 pneumonia.  He was apparently diagnosed with COVID-19 on 9/25.  See below for further details.                                                                 Hospital Course    Acute Hypoxic Resp Failure due to Covid 19 Viral pneumonia:  He has been treated with combination of steroids along with Remdisvir with good effect, he is down to room air and relatively symptom-free, will finish his Remdisvir course, he is now symptom-free on room air has finished his steroid and Remdisvir course, symptom-free will be discharged back to prison.  Discussed the case with prison physician on 03/28/2019.   He should still wear a mask for 2 full weeks from today when he is around other people.  COVID-19 Labs  Recent Labs    03/26/19 0324 03/27/19 0105   DDIMER 0.31 0.30  FERRITIN 142 136  CRP 1.9* 1.6*    Lab Results  Component Value Date   SARSCOV2NAA POSITIVE (A) 03/12/2019     PAF: Maintaining sinus rhythm-continue Coreg and Eliquis.  CAD: S/p PCI and CABG 2-3 years back-currently without any anginal symptoms.  Continue aspirin, beta-blocker, statin along with Ranexa.  No acute issues.  CKD stage III: Creatinine close to usual baseline.  Dyslipidemia: Continue statin  BPH: Continue finasteride and Flomax  Hypothyroidism: Continue levothyroxine  Obesity: BMI of 36 follow with PCP for weight loss.   DM-2 (A1c 8.4):  Resume home regimen upon discharge, will defer to the present physician on DM medications and diabetes control, poor outpatient control due to hyperglycemia.    Discharge diagnosis     Principal Problem:   Pneumonia due to COVID-19 virus Active Problems:   CAD (coronary artery disease)  of bypass graft   Type 2 diabetes mellitus with other specified complication (HCC)   Unspecified atrial fibrillation (HCC)   HTN (hypertension)   HLD (hyperlipidemia)   BPH (benign prostatic hyperplasia)   Hypophosphatemia   Hypokalemia   Hyponatremia   GERD (gastroesophageal reflux disease)    Discharge instructions    Discharge Instructions    Discharge instructions   Complete by: As directed    Follow with Primary MD in 7 days   Get CBC, CMP, 2 view Chest X ray -  checked next visit within 1 week by Primary MD    Activity: As tolerated with Full fall precautions use walker/cane & assistance as needed  Disposition Prison  Diet: Heart Healthy Low carb  Special Instructions: If you have smoked or chewed Tobacco  in the last 2 yrs please stop smoking, stop any regular Alcohol  and or any Recreational drug use.  On your next visit with your primary care physician please Get Medicines reviewed and adjusted.  Please request your Prim.MD to go over all Hospital Tests and Procedure/Radiological results at  the follow up, please get all Hospital records sent to your Prim MD by signing hospital release before you go home.  If you experience worsening of your admission symptoms, develop shortness of breath, life threatening emergency, suicidal or homicidal thoughts you must seek medical attention immediately by calling 911 or calling your MD immediately  if symptoms less severe.  You Must read complete instructions/literature along with all the possible adverse reactions/side effects for all the Medicines you take and that have been prescribed to you. Take any new Medicines after you have completely understood and accpet all the possible adverse reactions/side effects.   Increase activity slowly   Complete by: As directed       Discharge Medications   Allergies as of 03/28/2019      Reactions   Brilinta [ticagrelor] Shortness Of Breath      Medication List    STOP taking these medications   azithromycin 250 MG tablet Commonly known as: ZITHROMAX   methylPREDNISolone 4 MG Tbpk tablet Commonly known as: MEDROL DOSEPAK     TAKE these medications   acetaminophen 325 MG tablet Commonly known as: TYLENOL Take 2 tablets (650 mg total) by mouth every 4 (four) hours as needed for headache or mild pain.   amLODipine 10 MG tablet Commonly known as: NORVASC Take 10 mg by mouth daily.   apixaban 5 MG Tabs tablet Commonly known as: ELIQUIS Take 5 mg by mouth 2 (two) times daily.   aspirin EC 81 MG tablet Take 81 mg by mouth daily.   atorvastatin 80 MG tablet Commonly known as: LIPITOR Take 80 mg by mouth at bedtime.   carvedilol 6.25 MG tablet Commonly known as: COREG Take 1 tablet (6.25 mg total) by mouth 2 (two) times daily with a meal.   docusate sodium 100 MG capsule Commonly known as: COLACE Take 100 mg by mouth 2 (two) times daily.   finasteride 5 MG tablet Commonly known as: PROSCAR Take 5 mg by mouth daily.   insulin glargine 100 UNIT/ML injection Commonly known as:  LANTUS Inject 30 Units into the skin at bedtime.   isosorbide mononitrate 30 MG 24 hr tablet Commonly known as: IMDUR Take 1 tablet (30 mg total) by mouth daily. What changed: when to take this   levothyroxine 75 MCG tablet Commonly known as: SYNTHROID Take 75 mcg by mouth daily before breakfast.   metFORMIN 1000 MG  tablet Commonly known as: GLUCOPHAGE Take 1,000 mg by mouth daily.   nitroGLYCERIN 0.4 MG SL tablet Commonly known as: NITROSTAT Place 0.4 mg under the tongue every 5 (five) minutes as needed for chest pain.   omeprazole 20 MG capsule Commonly known as: PRILOSEC Take 20 mg by mouth every evening.   ranolazine 1000 MG SR tablet Commonly known as: RANEXA Take 1 tablet (1,000 mg total) by mouth 2 (two) times daily.   tamsulosin 0.4 MG Caps capsule Commonly known as: FLOMAX Take 0.4 mg by mouth daily.         Major procedures and Radiology Reports - PLEASE review detailed and final reports thoroughly  -        Dg Chest Port 1 View  Result Date: 03/22/2019 CLINICAL DATA:  Hypoxia. Shortness of breath. Cough. COVID-19 viral infection. EXAM: PORTABLE CHEST 1 VIEW COMPARISON:  03/20/2019 FINDINGS: Heart size remains within normal limits. Previous median sternotomy. Both lungs are clear. No evidence of pleural effusion. Several old right rib fracture deformities are again noted. IMPRESSION: Stable exam.  No active disease. Electronically Signed   By: Danae Orleans M.D.   On: 03/22/2019 19:27   Dg Chest Port 1 View  Result Date: 03/12/2019 CLINICAL DATA:  Nurse notes:Pt has a Covid pt on his prison unit CP since last night Shortness of breath since this morning NTG x 3 without relief SR on monitor on the way in EMS reports pt had fever 100.4 last night. Reports chest pai.*comment was truncated*chest pain covid exposure EXAM: PORTABLE CHEST 1 VIEW COMPARISON:  Radiograph 02/18/2018 FINDINGS: Sternotomy wires overlie normal cardiac silhouette. Normal pulmonary  vasculature. No effusion, infiltrate, or pneumothorax. No acute osseous abnormality. IMPRESSION: No acute cardiopulmonary process. Electronically Signed   By: Genevive Bi M.D.   On: 03/12/2019 16:48    Micro Results     Recent Results (from the past 240 hour(s))  Blood Culture (routine x 2)     Status: None   Collection Time: 03/22/19  6:14 PM   Specimen: Right Antecubital; Blood  Result Value Ref Range Status   Specimen Description RIGHT ANTECUBITAL  Final   Special Requests   Final    BOTTLES DRAWN AEROBIC AND ANAEROBIC Blood Culture adequate volume   Culture   Final    NO GROWTH 6 DAYS Performed at [facility], [address] [place], [place] [address]    Report Status 03/28/2019 FINAL  Final  Blood Culture (routine x 2)     Status: None   Collection Time: 03/22/19  6:40 PM   Specimen: BLOOD LEFT HAND  Result Value Ref Range Status   Specimen Description BLOOD LEFT HAND  Final   Special Requests   Final    BOTTLES DRAWN AEROBIC AND ANAEROBIC Blood Culture adequate volume   Culture   Final    NO GROWTH 6 DAYS Performed at [facility], [address], [place], [place] [address]    Report Status 03/28/2019 FINAL  Final  MRSA PCR Screening     Status: None   Collection Time: 03/23/19  9:40 AM   Specimen: Nasal Mucosa; Nasopharyngeal  Result Value Ref Range Status   MRSA by PCR NEGATIVE NEGATIVE Final    Comment:        The GeneXpert MRSA Assay (FDA approved for NASAL specimens only), is one component of a comprehensive MRSA colonization surveillance program. It is not intended to diagnose MRSA infection nor to guide or monitor treatment for MRSA infections. Performed at  [facility], [address], [place], [place] [address]     Today   Subjective    Zyshawn Bohnenkamp today has no headache,no chest abdominal pain,no new weakness tingling or numbness, feels much better .     Objective   Blood pressure 124/70, pulse 81, temperature 98.7 F  (37.1 C), temperature source Oral, resp. rate 15, height  (1.753 m), weight 113.3 kg, SpO2 95 %.   Intake/Output Summary (Last 24 hours) at 03/28/2019 0938 Last data filed at 03/28/2019 0900 Gross per 24 hour  Intake 200 ml  Output 2200 ml  Net -2000 ml    Exam Awake Alert, Oriented x 3, No new F.N deficits, Normal affect [place].AT,PERRAL Supple Neck,No JVD, No cervical lymphadenopathy appriciated.  Symmetrical Chest wall movement, Good air movement bilaterally, CTAB RRR,No Gallops,Rubs or new Murmurs, No Parasternal Heave +ve B.Sounds, Abd Soft, Non tender, No organomegaly appriciated, No rebound -guarding or rigidity. No Cyanosis, Clubbing or edema, No new Rash or bruise   Data Review   CBC w Diff:  Lab Results  Component Value Date   WBC 12.7 (H) 03/27/2019   HGB 12.9 (L) 03/27/2019   HCT 38.1 (L) 03/27/2019   PLT 345 03/27/2019   LYMPHOPCT 12 03/27/2019   MONOPCT 8 03/27/2019   EOSPCT 0 03/27/2019   BASOPCT 1 03/27/2019    CMP:  Lab Results  Component Value Date   NA 135 03/28/2019   K 4.0 03/28/2019   CL 100 03/28/2019   CO2 25 03/28/2019   BUN 34 (H) 03/28/2019   CREATININE 1.02 03/28/2019   PROT 5.5 (L) 03/28/2019   ALBUMIN 2.7 (L) 03/28/2019   BILITOT 0.5 03/28/2019   ALKPHOS 61 03/28/2019   AST 20 03/28/2019   ALT 26 03/28/2019  .   Total Time in preparing paper work, data evaluation and todays exam - 35 minutes  Susa Raring M.D on 03/28/2019 at 9:38 AM  [facility]   Office  [phone]

## 2019-03-28 NOTE — Progress Notes (Signed)
CSW spoke with Rollene Fare, with [facility], please call report to [phone]. DC summary faxed to [phone].   Officers at bedside will transport patient.   Kingsley Spittle, LCSW [facility] [place]  [phone]

## 2019-03-28 NOTE — Progress Notes (Signed)
1045: Report called to Nurse Jannifer Franklin.  1110: Patient discharge with guards. Discharge instructions provided. Personal items given to patient and guards.

## 2019-03-28 NOTE — Discharge Instructions (Signed)
Follow with Primary MD in 7 days   Get CBC, CMP, 2 view Chest X ray -  checked next visit within 1 week by Primary MD    Activity: As tolerated with Full fall precautions use walker/cane & assistance as needed  Disposition [facility]  Diet: Heart Healthy Low carb  Special Instructions: If you have smoked or chewed Tobacco  in the last 2 yrs please stop smoking, stop any regular Alcohol  and or any Recreational drug use.  On your next visit with your primary care physician please Get Medicines reviewed and adjusted.  Please request your Prim.MD to go over all Hospital Tests and Procedure/Radiological results at the follow up, please get all Hospital records sent to your Prim MD by signing hospital release before you go home.  If you experience worsening of your admission symptoms, develop shortness of breath, life threatening emergency, suicidal or homicidal thoughts you must seek medical attention immediately by calling 911 or calling your MD immediately  if symptoms less severe.  You Must read complete instructions/literature along with all the possible adverse reactions/side effects for all the Medicines you take and that have been prescribed to you. Take any new Medicines after you have completely understood and accpet all the possible adverse reactions/side effects.         Person Under Monitoring Name: Kevin Ballard  Location: [address] [place] [address]   Infection Prevention Recommendations for Individuals Confirmed to have, or Being Evaluated for, 2019 Novel Coronavirus (COVID-19) Infection Who Receive Care at Home  Individuals who are confirmed to have, or are being evaluated for, COVID-19 should follow the prevention steps below until a healthcare provider or local or state health department says they can return to normal activities.  Stay home except to get medical care You should restrict activities outside your home, except for getting medical care. Do not go to work,  school, or public areas, and do not use public transportation or taxis.  Call ahead before visiting your doctor Before your medical appointment, call the healthcare provider and tell them that you have, or are being evaluated for, COVID-19 infection. This will help the healthcare providers office take steps to keep other people from getting infected. Ask your healthcare provider to call the local or state health department.  Monitor your symptoms Seek prompt medical attention if your illness is worsening (e.g., difficulty breathing). Before going to your medical appointment, call the healthcare provider and tell them that you have, or are being evaluated for, COVID-19 infection. Ask your healthcare provider to call the local or state health department.  Wear a facemask You should wear a facemask that covers your nose and mouth when you are in the same room with other people and when you visit a healthcare provider. People who live with or visit you should also wear a facemask while they are in the same room with you.  Separate yourself from other people in your home As much as possible, you should stay in a different room from other people in your home. Also, you should use a separate bathroom, if available.  Avoid sharing household items You should not share dishes, drinking glasses, cups, eating utensils, towels, bedding, or other items with other people in your home. After using these items, you should wash them thoroughly with soap and water.  Cover your coughs and sneezes Cover your mouth and nose with a tissue when you cough or sneeze, or you can cough or sneeze into your sleeve.  Throw used tissues in a lined trash can, and immediately wash your hands with soap and water for at least 20 seconds or use an alcohol-based hand rub.  Wash your [employer] Corporationhands Wash your hands often and thoroughly with soap and water for at least 20 seconds. You can use an alcohol-based hand sanitizer if soap  and water are not available and if your hands are not visibly dirty. Avoid touching your eyes, nose, and mouth with unwashed hands.   Prevention Steps for Caregivers and Household Members of Individuals Confirmed to have, or Being Evaluated for, COVID-19 Infection Being Cared for in the Home  If you live with, or provide care at home for, a person confirmed to have, or being evaluated for, COVID-19 infection please follow these guidelines to prevent infection:  Follow healthcare providers instructions Make sure that you understand and can help the patient follow any healthcare provider instructions for all care.  Provide for the patients basic needs You should help the patient with basic needs in the home and provide support for getting groceries, prescriptions, and other personal needs.  Monitor the patients symptoms If they are getting sicker, call his or her medical provider and tell them that the patient has, or is being evaluated for, COVID-19 infection. This will help the healthcare providers office take steps to keep other people from getting infected. Ask the healthcare provider to call the local or state health department.  Limit the number of people who have contact with the patient  If possible, have only one caregiver for the patient.  Other household members should stay in another home or place of residence. If this is not possible, they should stay  in another room, or be separated from the patient as much as possible. Use a separate bathroom, if available.  Restrict visitors who do not have an essential need to be in the home.  Keep older adults, very young children, and other sick people away from the patient Keep older adults, very young children, and those who have compromised immune systems or chronic health conditions away from the patient. This includes people with chronic heart, lung, or kidney conditions, diabetes, and cancer.  Ensure good  ventilation Make sure that shared spaces in the home have good air flow, such as from an air conditioner or an opened window, weather permitting.  Wash your hands often  Wash your hands often and thoroughly with soap and water for at least 20 seconds. You can use an alcohol based hand sanitizer if soap and water are not available and if your hands are not visibly dirty.  Avoid touching your eyes, nose, and mouth with unwashed hands.  Use disposable paper towels to dry your hands. If not available, use dedicated cloth towels and replace them when they become wet.  Wear a facemask and gloves  Wear a disposable facemask at all times in the room and gloves when you touch or have contact with the patients blood, body fluids, and/or secretions or excretions, such as sweat, saliva, sputum, nasal mucus, vomit, urine, or feces.  Ensure the mask fits over your nose and mouth tightly, and do not touch it during use.  Throw out disposable facemasks and gloves after using them. Do not reuse.  Wash your hands immediately after removing your facemask and gloves.  If your personal clothing becomes contaminated, carefully remove clothing and launder. Wash your hands after handling contaminated clothing.  Place all used disposable facemasks, gloves, and other waste in a lined container  before disposing them with other household waste.  Remove gloves and wash your hands immediately after handling these items.  Do not share dishes, glasses, or other household items with the patient  Avoid sharing household items. You should not share dishes, drinking glasses, cups, eating utensils, towels, bedding, or other items with a patient who is confirmed to have, or being evaluated for, COVID-19 infection.  After the person uses these items, you should wash them thoroughly with soap and water.  Wash laundry thoroughly  Immediately remove and wash clothes or bedding that have blood, body fluids, and/or  secretions or excretions, such as sweat, saliva, sputum, nasal mucus, vomit, urine, or feces, on them.  Wear gloves when handling laundry from the patient.  Read and follow directions on labels of laundry or clothing items and detergent. In general, wash and dry with the warmest temperatures recommended on the label.  Clean all areas the individual has used often  Clean all touchable surfaces, such as counters, tabletops, doorknobs, bathroom fixtures, toilets, phones, keyboards, tablets, and bedside tables, every day. Also, clean any surfaces that may have blood, body fluids, and/or secretions or excretions on them.  Wear gloves when cleaning surfaces the patient has come in contact with.  Use a diluted bleach solution (e.g., dilute bleach with 1 part bleach and 10 parts water) or a household disinfectant with a label that says EPA-registered for coronaviruses. To make a bleach solution at home, add 1 tablespoon of bleach to 1 quart (4 cups) of water. For a larger supply, add  cup of bleach to 1 gallon (16 cups) of water.  Read labels of cleaning products and follow recommendations provided on product labels. Labels contain instructions for safe and effective use of the cleaning product including precautions you should take when applying the product, such as wearing gloves or eye protection and making sure you have good ventilation during use of the product.  Remove gloves and wash hands immediately after cleaning.  Monitor yourself for signs and symptoms of illness Caregivers and household members are considered close contacts, should monitor their health, and will be asked to limit movement outside of the home to the extent possible. Follow the monitoring steps for close contacts listed on the symptom monitoring form.   ? If you have additional questions, contact your local health department or call the epidemiologist on call at [phone] (available 24/7). ? This guidance is subject  to change. For the most up-to-date guidance from [facility], please refer to their website: [email or web address].pl

## 2019-04-01 ENCOUNTER — Emergency Department (HOSPITAL_COMMUNITY)

## 2019-04-01 ENCOUNTER — Encounter (HOSPITAL_COMMUNITY): Payer: Self-pay | Admitting: Emergency Medicine

## 2019-04-01 ENCOUNTER — Emergency Department (HOSPITAL_COMMUNITY)
Admission: EM | Admit: 2019-04-01 | Discharge: 2019-04-01 | Disposition: A | Discharge status: Home / Self Care | Attending: Emergency Medicine | Admitting: Emergency Medicine

## 2019-04-01 ENCOUNTER — Other Ambulatory Visit: Payer: Self-pay

## 2019-04-01 DIAGNOSIS — I10 Essential (primary) hypertension: Secondary | ICD-10-CM | POA: Diagnosis not present

## 2019-04-01 DIAGNOSIS — Z7982 Long term (current) use of aspirin: Secondary | ICD-10-CM | POA: Insufficient documentation

## 2019-04-01 DIAGNOSIS — R05 Cough: Secondary | ICD-10-CM

## 2019-04-01 DIAGNOSIS — Z794 Long term (current) use of insulin: Secondary | ICD-10-CM | POA: Diagnosis not present

## 2019-04-01 DIAGNOSIS — Z7901 Long term (current) use of anticoagulants: Secondary | ICD-10-CM | POA: Insufficient documentation

## 2019-04-01 DIAGNOSIS — I252 Old myocardial infarction: Secondary | ICD-10-CM | POA: Insufficient documentation

## 2019-04-01 DIAGNOSIS — R059 Cough, unspecified: Secondary | ICD-10-CM

## 2019-04-01 DIAGNOSIS — U071 COVID-19: Secondary | ICD-10-CM | POA: Diagnosis not present

## 2019-04-01 DIAGNOSIS — I2510 Atherosclerotic heart disease of native coronary artery without angina pectoris: Secondary | ICD-10-CM | POA: Diagnosis not present

## 2019-04-01 DIAGNOSIS — E119 Type 2 diabetes mellitus without complications: Secondary | ICD-10-CM | POA: Insufficient documentation

## 2019-04-01 DIAGNOSIS — Z79899 Other long term (current) drug therapy: Secondary | ICD-10-CM | POA: Diagnosis not present

## 2019-04-01 LAB — CBC
HCT: 42.6 % (ref 39.0–52.0)
Hemoglobin: 13.8 g/dL (ref 13.0–17.0)
MCH: 29.8 pg (ref 26.0–34.0)
MCHC: 32.4 g/dL (ref 30.0–36.0)
MCV: 92 fL (ref 80.0–100.0)
Platelets: 307 10*3/uL (ref 150–400)
RBC: 4.63 MIL/uL (ref 4.22–5.81)
RDW: 13.9 % (ref 11.5–15.5)
WBC: 11 10*3/uL — ABNORMAL HIGH (ref 4.0–10.5)
nRBC: 0 % (ref 0.0–0.2)

## 2019-04-01 LAB — D-DIMER, QUANTITATIVE: D-Dimer, Quant: 0.34 ug/mL-FEU (ref 0.00–0.50)

## 2019-04-01 LAB — COMPREHENSIVE METABOLIC PANEL
ALT: 23 U/L (ref 0–44)
AST: 18 U/L (ref 15–41)
Albumin: 3 g/dL — ABNORMAL LOW (ref 3.5–5.0)
Alkaline Phosphatase: 67 U/L (ref 38–126)
Anion gap: 10 (ref 5–15)
BUN: 21 mg/dL (ref 8–23)
CO2: 22 mmol/L (ref 22–32)
Calcium: 8.3 mg/dL — ABNORMAL LOW (ref 8.9–10.3)
Chloride: 100 mmol/L (ref 98–111)
Creatinine, Ser: 1.19 mg/dL (ref 0.61–1.24)
GFR calc Af Amer: >60 mL/min (ref >60)
GFR calc non Af Amer: >60 mL/min (ref >60)
Glucose, Bld: 245 mg/dL — ABNORMAL HIGH (ref 70–99)
Potassium: 4.2 mmol/L (ref 3.5–5.1)
Sodium: 132 mmol/L — ABNORMAL LOW (ref 135–145)
Total Bilirubin: 0.9 mg/dL (ref 0.3–1.2)
Total Protein: 6.6 g/dL (ref 6.5–8.1)

## 2019-04-01 LAB — LACTIC ACID, PLASMA: Lactic Acid, Venous: 1.1 mmol/L (ref 0.5–1.9)

## 2019-04-01 LAB — TROPONIN I (HIGH SENSITIVITY)
Troponin I (High Sensitivity): 8 ng/L (ref <18)
Troponin I (High Sensitivity): 8 ng/L (ref <18)

## 2019-04-01 MED ORDER — DOXYCYCLINE HYCLATE 100 MG PO TABS
100.0000 mg | ORAL_TABLET | Freq: Once | ORAL | Status: AC
  Administered 2019-04-01: 100 mg via ORAL
  Filled 2019-04-01: qty 1

## 2019-04-01 MED ORDER — DOXYCYCLINE HYCLATE 100 MG PO CAPS: 100.0000 mg | ORAL_CAPSULE | Freq: Two times a day (BID) | ORAL | 0 refills | Status: DC

## 2019-04-01 NOTE — Discharge Instructions (Addendum)
It was our pleasure to provide your ER care today - we hope that you feel better.  Drink plenty of fluids. Stay active, take full and deep breaths.   Take antibiotic as prescribed.   Take acetaminophen as need.   Follow up with primary care doctor in the next 1-2 weeks if symptoms fail to improve/resolve.  Return to ER if worse, new symptoms, high fevers, new or severe pain, increased trouble breathing, or other concern.

## 2019-04-01 NOTE — ED Notes (Signed)
Report called to Nurse [place], [phone].

## 2019-04-01 NOTE — ED Provider Notes (Addendum)
[facility] EMERGENCY DEPARTMENT Provider Note   CSN: [id number] Arrival date & time: 04/01/19  1207     History   Chief Complaint Chief Complaint  Patient presents with  . Shortness of Breath    HPI Kevin Ballard is a 63 y.o. male.     Patient w recent dx covid infection, c/o mid chest pressure, occasional non prod cough, mild sob, body aches, temp 99.6 for past 2 days. Symptoms acute onset, moderate, persistent, felt worse today. patient notes was hospitalized w covid, symptoms had improved, and discharged a few days ago. No sore throat or runny nose. No prod cough. Dull mid chest pain, located midline/sternal area in past 2 days - not pleuritic, no change whether upright or supine, worse w cough. Denies leg pain or swelling. No abd pain or nvd. No dysuria or gu c/o.   The history is provided by the patient.  Shortness of Breath Associated symptoms: cough and fever   Associated symptoms: no abdominal pain, no chest pain, no headaches, no neck pain, no rash, no sore throat and no vomiting     Past Medical History:  Diagnosis Date  . Atrial fibrillation (HCC)   . BPH (benign prostatic hyperplasia)   . Coronary artery disease   . Coronary artery disease involving coronary bypass graft   . Diabetes mellitus without complication (HCC)   . GERD (gastroesophageal reflux disease)   . Hypercholesterolemia   . Hypertension   . Myocardial infarct [facility])     Patient Active Problem List   Diagnosis Date Noted  . Pneumonia due to COVID-19 virus 03/22/2019  . Hypophosphatemia 03/22/2019  . Hypokalemia 03/22/2019  . Hyponatremia 03/22/2019  . GERD (gastroesophageal reflux disease)   . CAD (coronary artery disease) of bypass graft 12/19/2017  . Type 2 diabetes mellitus with other specified complication (HCC) 12/19/2017  . Unspecified atrial fibrillation (HCC) 12/19/2017  . Chronic anticoagulation 12/19/2017  . HTN (hypertension) 12/19/2017  . HLD (hyperlipidemia) 12/19/2017  . BPH  (benign prostatic hyperplasia) 12/19/2017    Past Surgical History:  Procedure Laterality Date  . LEFT HEART CATH AND CORS/GRAFTS ANGIOGRAPHY N/A 12/21/2017   Procedure: LEFT HEART CATH AND CORS/GRAFTS ANGIOGRAPHY;  Surgeon: [place], Peter M, MD;  Location: [facility] INVASIVE CV LAB;  Service: Cardiovascular;  Laterality: N/A;  . LUMBAR FUSION    . MEDIAL COLLATERAL LIGAMENT REPAIR, KNEE    . TIBIAL FASCIOTOMY    . tibiastomy          Home Medications    Prior to Admission medications   Medication Sig Start Date End Date Taking? Authorizing Provider  acetaminophen (TYLENOL) 325 MG tablet Take 2 tablets (650 mg total) by mouth every 4 (four) hours as needed for headache or mild pain. 12/22/17   Lonia Blood, MD  amLODipine (NORVASC) 10 MG tablet Take 10 mg by mouth daily.    [provider]  apixaban (ELIQUIS) 5 MG TABS tablet Take 5 mg by mouth 2 (two) times daily.    [provider]  aspirin EC 81 MG tablet Take 81 mg by mouth daily.    [provider]  atorvastatin (LIPITOR) 80 MG tablet Take 80 mg by mouth at bedtime.     [provider]  carvedilol (COREG) 6.25 MG tablet Take 1 tablet (6.25 mg total) by mouth 2 (two) times daily with a meal. 12/22/17   Lonia Blood, MD  docusate sodium (COLACE) 100 MG capsule Take 100 mg by mouth 2 (two) times daily.  [provider]  finasteride (PROSCAR) 5 MG tablet Take 5 mg by mouth daily.    [provider]  insulin glargine (LANTUS) 100 UNIT/ML injection Inject 30 Units into the skin at bedtime.     [provider]  isosorbide mononitrate (IMDUR) 30 MG 24 hr tablet Take 1 tablet (30 mg total) by mouth daily. Patient taking differently: Take 30 mg by mouth at bedtime.  12/23/17   Lonia Blood, MD  levothyroxine (SYNTHROID) 75 MCG tablet Take 75 mcg by mouth daily before breakfast.    [provider]  metFORMIN (GLUCOPHAGE) 1000 MG tablet Take 1,000 mg by mouth daily.     [provider]  nitroGLYCERIN (NITROSTAT) 0.4 MG SL tablet Place 0.4 mg under the tongue every 5 (five) minutes as needed for chest pain.     [provider]  omeprazole (PRILOSEC) 20 MG capsule Take 20 mg by mouth every evening.     [provider]  ranolazine (RANEXA) 1000 MG SR tablet Take 1 tablet (1,000 mg total) by mouth 2 (two) times daily. 12/22/17   Lonia Blood, MD  tamsulosin (FLOMAX) 0.4 MG CAPS capsule Take 0.4 mg by mouth daily.    [provider]    Family History Family History  Problem Relation Age of Onset  . Heart attack Mother   . Cancer Mother   . Heart attack Maternal Grandmother     Social History Social History   Tobacco Use  . Smoking status: Never Smoker  . Smokeless tobacco: Never Used  Substance Use Topics  . Alcohol use: Never    Frequency: Never  . Drug use: Never     Allergies   Brilinta [ticagrelor]   Review of Systems Review of Systems  Constitutional: Positive for fever.  HENT: Negative for sore throat.   Eyes: Negative for redness.  Respiratory: Positive for cough and shortness of breath.   Cardiovascular: Negative for chest pain.  Gastrointestinal: Negative for abdominal pain, diarrhea and vomiting.  Endocrine: Negative for polyuria.  Genitourinary: Negative for dysuria and flank pain.  Musculoskeletal: Positive for myalgias. Negative for neck pain and neck stiffness.  Skin: Negative for rash.  Neurological: Negative for headaches.  Hematological: Does not bruise/bleed easily.  Psychiatric/Behavioral: Negative for confusion.     Physical Exam Updated Vital Signs BP 118/64 (BP Location: Left Arm)   Pulse 79   Temp 98.6 F (37 C) (Oral)   Resp 14   Ht 1.753 m (5\' 9" )   Wt 113.4 kg   SpO2 98%   BMI 36.92 kg/m   Physical Exam Vitals signs and nursing note reviewed.  Constitutional:      Appearance: Normal appearance. He is well-developed.  HENT:     Head: Atraumatic.      Nose: Nose normal.     Mouth/Throat:     Mouth: Mucous membranes are moist.     Pharynx: Oropharynx is clear.  Eyes:     General: No scleral icterus.    Conjunctiva/sclera: Conjunctivae normal.  Neck:     Musculoskeletal: Normal range of motion and neck supple. No neck rigidity.     Trachea: No tracheal deviation.     Comments: No stiffness or rigidity.  Cardiovascular:     Rate and Rhythm: Normal rate and regular rhythm.     Pulses: Normal pulses.     Heart sounds: Normal heart sounds. No murmur. No friction rub. No gallop.   Pulmonary:     Effort: Pulmonary effort  is normal. No accessory muscle usage or respiratory distress.     Breath sounds: Normal breath sounds.  Chest:     Chest wall: Tenderness present.  Abdominal:     General: Bowel sounds are normal. There is no distension.     Palpations: Abdomen is soft.     Tenderness: There is no abdominal tenderness. There is no guarding.  Genitourinary:    Comments: No cva tenderness. Musculoskeletal:        General: No swelling or tenderness.     Right lower leg: No edema.     Left lower leg: No edema.  Skin:    General: Skin is warm and dry.     Findings: No rash.  Neurological:     Mental Status: He is alert.     Comments: Alert, speech clear.   Psychiatric:        Mood and Affect: Mood normal.      ED Treatments / Results  Labs (all labs ordered are listed, but only abnormal results are displayed) Results for orders placed or performed during the hospital encounter of 04/01/19  CBC  Result Value Ref Range   WBC 11.0 (H) 4.0 - 10.5 K/uL   RBC 4.63 4.22 - 5.81 MIL/uL   Hemoglobin 13.8 13.0 - 17.0 g/dL   HCT 42.6 39.0 - 52.0 %   MCV 92.0 80.0 - 100.0 fL   MCH 29.8 26.0 - 34.0 pg   MCHC 32.4 30.0 - 36.0 g/dL   RDW 13.9 11.5 - 15.5 %   Platelets 307 150 - 400 K/uL   nRBC 0.0 0.0 - 0.2 %  Comprehensive metabolic panel  Result Value Ref Range   Sodium 132 (L) 135 - 145 mmol/L   Potassium 4.2 3.5 - 5.1 mmol/L    Chloride 100 98 - 111 mmol/L   CO2 22 22 - 32 mmol/L   Glucose, Bld 245 (H) 70 - 99 mg/dL   BUN 21 8 - 23 mg/dL   Creatinine, Ser 1.19 0.61 - 1.24 mg/dL   Calcium 8.3 (L) 8.9 - 10.3 mg/dL   Total Protein 6.6 6.5 - 8.1 g/dL   Albumin 3.0 (L) 3.5 - 5.0 g/dL   AST 18 15 - 41 U/L   ALT 23 0 - 44 U/L   Alkaline Phosphatase 67 38 - 126 U/L   Total Bilirubin 0.9 0.3 - 1.2 mg/dL   GFR calc non Af Amer >60 >60 mL/min   GFR calc Af Amer >60 >60 mL/min   Anion gap 10 5 - 15  D-dimer, quantitative (not at [facility])  Result Value Ref Range   D-Dimer, Quant 0.34 0.00 - 0.50 ug/mL-FEU  Lactic acid, plasma  Result Value Ref Range   Lactic Acid, Venous 1.1 0.5 - 1.9 mmol/L  Troponin I (High Sensitivity)  Result Value Ref Range   Troponin I (High Sensitivity) 8 <18 ng/L  Troponin I (High Sensitivity)  Result Value Ref Range   Troponin I (High Sensitivity) 8 <18 ng/L   Dg Chest Port 1 View  Result Date: 04/01/2019 CLINICAL DATA:  Covid positive. Worsening shortness of breath, fevers and headache. EXAM: PORTABLE CHEST 1 VIEW COMPARISON:  03/22/2019 FINDINGS: The patient is status post median sternotomy and CABG procedure. Mild cardiac enlargement. Progressive, multifocal airspace opacities within the right upper and lower lung zones identified compared with 03/22/2019. IMPRESSION: 1. Worsening aeration to the right lung compatible with multifocal pneumonia. 2. Stable cardiac enlargement. Electronically Signed   By: Queen Slough.D.  On: 04/01/2019 13:33   Dg Chest Port 1 View  Result Date: 03/22/2019 CLINICAL DATA:  Hypoxia. Shortness of breath. Cough. COVID-19 viral infection. EXAM: PORTABLE CHEST 1 VIEW COMPARISON:  03/20/2019 FINDINGS: Heart size remains within normal limits. Previous median sternotomy. Both lungs are clear. No evidence of pleural effusion. Several old right rib fracture deformities are again noted. IMPRESSION: Stable exam.  No active disease. Electronically Signed   By: Danae OrleansJohn A  Stahl M.D.   On: 03/22/2019 19:27   Dg Chest Port 1 View  Result Date: 03/12/2019 CLINICAL DATA:  Nurse notes:Pt has a Covid [facility] since last night Shortness of breath since this morning NTG x 3 without relief SR on monitor on the way in EMS reports pt had fever 100.4 last night. Reports chest pai.*comment was truncated*chest pain covid exposure EXAM: PORTABLE CHEST 1 VIEW COMPARISON:  Radiograph 02/18/2018 FINDINGS: Sternotomy wires overlie normal cardiac silhouette. Normal pulmonary vasculature. No effusion, infiltrate, or pneumothorax. No acute osseous abnormality. IMPRESSION: No acute cardiopulmonary process. Electronically Signed   By: Genevive BiStewart  Edmunds M.D.   On: 03/12/2019 16:48    EKG EKG Interpretation  Date/Time:  Friday April 01 2019 12:46:30 EDT Ventricular Rate:  77 PR Interval:    QRS Duration: 98 QT Interval:  394 QTC Calculation: 446 R Axis:   137 Text Interpretation:  Sinus rhythm No significant change since last tracing Confirmed by Cathren LaineSteinl, Arihana Ambrocio ([id number]) on 04/01/2019 12:54:56 PM   Radiology Dg Chest Port 1 View  Result Date: 04/01/2019 CLINICAL DATA:  Covid positive. Worsening shortness of breath, fevers and headache. EXAM: PORTABLE CHEST 1 VIEW COMPARISON:  03/22/2019 FINDINGS: The patient is status post median sternotomy and CABG procedure. Mild cardiac enlargement. Progressive, multifocal airspace opacities within the right upper and lower lung zones identified compared with 03/22/2019. IMPRESSION: 1. Worsening aeration to the right lung compatible with multifocal pneumonia. 2. Stable cardiac enlargement. Electronically Signed   By: Signa Kellaylor  Stroud M.D.   On: 04/01/2019 13:33    Procedures Procedures (including critical care time)  Medications Ordered in ED Medications - No data to display   Initial Impression / Assessment and Plan / ED Course  I have reviewed the triage vital signs and the nursing notes.  Pertinent labs & imaging results  that were available during my care of the patient were reviewed by me and considered in my medical decision making (see chart for details).  Iv ns. Stat labs and imaging. Ecg.   Reviewed nursing notes and prior charts for additional history.   Labs reviewed by me - initial trop normal. ddimer normal.   CXR reviewed by me - pna, ?due to recent covid vs bact pna.   Pt is breathing comfortably. Will add abx rx. Delta trop pending.   Pt not hypoxic, not tachypneic. If delta trop normal/not increasing, will plan for d/c with outpt abx tx.   Deltra trop is normal. Pt continues to breaths comfortably and appears in no acute distress.       Final Clinical Impressions(s) / ED Diagnoses   Final diagnoses:  None    ED Discharge Orders    None           Cathren LaineSteinl, [place] Zaino, MD 04/01/19 1601

## 2019-04-01 NOTE — ED Triage Notes (Signed)
COVID positive Sept 25th.  Discharged from [facility] on Monday. Pt from [place]  C/o  C/o increasing SOB, fever (99.6) and headache.  Non-productive cough.  General aches and weakness.

## 2019-07-08 ENCOUNTER — Encounter (HOSPITAL_COMMUNITY): Payer: Self-pay | Admitting: Emergency Medicine

## 2019-07-08 ENCOUNTER — Other Ambulatory Visit: Payer: Self-pay

## 2019-07-08 ENCOUNTER — Observation Stay (HOSPITAL_COMMUNITY)
Admission: EM | Admit: 2019-07-08 | Discharge: 2019-07-11 | Disposition: A | Discharge status: Home / Self Care | Attending: Cardiovascular Disease | Admitting: Cardiovascular Disease

## 2019-07-08 ENCOUNTER — Emergency Department (HOSPITAL_COMMUNITY)

## 2019-07-08 DIAGNOSIS — Z79899 Other long term (current) drug therapy: Secondary | ICD-10-CM | POA: Diagnosis not present

## 2019-07-08 DIAGNOSIS — F419 Anxiety disorder, unspecified: Secondary | ICD-10-CM | POA: Insufficient documentation

## 2019-07-08 DIAGNOSIS — Z951 Presence of aortocoronary bypass graft: Secondary | ICD-10-CM | POA: Diagnosis not present

## 2019-07-08 DIAGNOSIS — I480 Paroxysmal atrial fibrillation: Secondary | ICD-10-CM | POA: Insufficient documentation

## 2019-07-08 DIAGNOSIS — Z794 Long term (current) use of insulin: Secondary | ICD-10-CM | POA: Insufficient documentation

## 2019-07-08 DIAGNOSIS — G4700 Insomnia, unspecified: Secondary | ICD-10-CM | POA: Insufficient documentation

## 2019-07-08 DIAGNOSIS — R072 Precordial pain: Principal | ICD-10-CM | POA: Insufficient documentation

## 2019-07-08 DIAGNOSIS — Z7901 Long term (current) use of anticoagulants: Secondary | ICD-10-CM | POA: Diagnosis not present

## 2019-07-08 DIAGNOSIS — Z7982 Long term (current) use of aspirin: Secondary | ICD-10-CM | POA: Insufficient documentation

## 2019-07-08 DIAGNOSIS — E785 Hyperlipidemia, unspecified: Secondary | ICD-10-CM | POA: Diagnosis not present

## 2019-07-08 DIAGNOSIS — I208 Other forms of angina pectoris: Secondary | ICD-10-CM

## 2019-07-08 DIAGNOSIS — N400 Enlarged prostate without lower urinary tract symptoms: Secondary | ICD-10-CM | POA: Diagnosis not present

## 2019-07-08 DIAGNOSIS — K219 Gastro-esophageal reflux disease without esophagitis: Secondary | ICD-10-CM | POA: Diagnosis not present

## 2019-07-08 DIAGNOSIS — Z8616 Personal history of COVID-19: Secondary | ICD-10-CM | POA: Insufficient documentation

## 2019-07-08 DIAGNOSIS — Z20822 Contact with and (suspected) exposure to covid-19: Secondary | ICD-10-CM | POA: Diagnosis not present

## 2019-07-08 DIAGNOSIS — I25118 Atherosclerotic heart disease of native coronary artery with other forms of angina pectoris: Secondary | ICD-10-CM | POA: Insufficient documentation

## 2019-07-08 DIAGNOSIS — E119 Type 2 diabetes mellitus without complications: Secondary | ICD-10-CM | POA: Diagnosis not present

## 2019-07-08 DIAGNOSIS — I10 Essential (primary) hypertension: Secondary | ICD-10-CM | POA: Diagnosis not present

## 2019-07-08 DIAGNOSIS — R42 Dizziness and giddiness: Secondary | ICD-10-CM | POA: Diagnosis not present

## 2019-07-08 DIAGNOSIS — I25810 Atherosclerosis of coronary artery bypass graft(s) without angina pectoris: Secondary | ICD-10-CM | POA: Diagnosis present

## 2019-07-08 DIAGNOSIS — R079 Chest pain, unspecified: Secondary | ICD-10-CM | POA: Diagnosis present

## 2019-07-08 DIAGNOSIS — I252 Old myocardial infarction: Secondary | ICD-10-CM | POA: Insufficient documentation

## 2019-07-08 DIAGNOSIS — R0602 Shortness of breath: Secondary | ICD-10-CM | POA: Insufficient documentation

## 2019-07-08 DIAGNOSIS — E1169 Type 2 diabetes mellitus with other specified complication: Secondary | ICD-10-CM | POA: Diagnosis present

## 2019-07-08 LAB — HEPATIC FUNCTION PANEL
ALT: 20 U/L (ref 0–44)
AST: 17 U/L (ref 15–41)
Albumin: 3.8 g/dL (ref 3.5–5.0)
Alkaline Phosphatase: 47 U/L (ref 38–126)
Bilirubin, Direct: 0.1 mg/dL (ref 0.0–0.2)
Indirect Bilirubin: 0.7 mg/dL (ref 0.3–0.9)
Total Bilirubin: 0.8 mg/dL (ref 0.3–1.2)
Total Protein: 6.1 g/dL — ABNORMAL LOW (ref 6.5–8.1)

## 2019-07-08 LAB — BASIC METABOLIC PANEL
Anion gap: 12 (ref 5–15)
BUN: 21 mg/dL (ref 8–23)
CO2: 23 mmol/L (ref 22–32)
Calcium: 9 mg/dL (ref 8.9–10.3)
Chloride: 102 mmol/L (ref 98–111)
Creatinine, Ser: 1.25 mg/dL — ABNORMAL HIGH (ref 0.61–1.24)
GFR calc Af Amer: >60 mL/min (ref >60)
GFR calc non Af Amer: >60 mL/min (ref >60)
Glucose, Bld: 168 mg/dL — ABNORMAL HIGH (ref 70–99)
Potassium: 4.4 mmol/L (ref 3.5–5.1)
Sodium: 137 mmol/L (ref 135–145)

## 2019-07-08 LAB — CBC
HCT: 42.7 % (ref 39.0–52.0)
Hemoglobin: 14 g/dL (ref 13.0–17.0)
MCH: 30.3 pg (ref 26.0–34.0)
MCHC: 32.8 g/dL (ref 30.0–36.0)
MCV: 92.4 fL (ref 80.0–100.0)
Platelets: 248 10*3/uL (ref 150–400)
RBC: 4.62 MIL/uL (ref 4.22–5.81)
RDW: 13 % (ref 11.5–15.5)
WBC: 6.5 10*3/uL (ref 4.0–10.5)
nRBC: 0 % (ref 0.0–0.2)

## 2019-07-08 LAB — RESPIRATORY PANEL BY RT PCR (FLU A&B, COVID)
Influenza A by PCR: NEGATIVE
Influenza B by PCR: NEGATIVE
SARS Coronavirus 2 by RT PCR: NEGATIVE

## 2019-07-08 LAB — TROPONIN I (HIGH SENSITIVITY)
Troponin I (High Sensitivity): 5 ng/L (ref <18)
Troponin I (High Sensitivity): 5 ng/L (ref <18)

## 2019-07-08 MED ORDER — TAMSULOSIN HCL 0.4 MG PO CAPS
0.4000 mg | ORAL_CAPSULE | Freq: Every day | ORAL | Status: DC
  Administered 2019-07-09 – 2019-07-11 (×3): 0.4 mg via ORAL
  Filled 2019-07-08 (×3): qty 1

## 2019-07-08 MED ORDER — METFORMIN HCL 500 MG PO TABS: 1000.0000 mg | ORAL_TABLET | Freq: Every day | ORAL | Status: DC

## 2019-07-08 MED ORDER — RANOLAZINE ER 500 MG PO TB12
1000.0000 mg | ORAL_TABLET | Freq: Two times a day (BID) | ORAL | Status: DC
  Administered 2019-07-09 – 2019-07-11 (×6): 1000 mg via ORAL
  Filled 2019-07-08 (×6): qty 2

## 2019-07-08 MED ORDER — ACETAMINOPHEN 650 MG RE SUPP: 650.0000 mg | Freq: Four times a day (QID) | RECTAL | Status: DC | PRN

## 2019-07-08 MED ORDER — ZOLPIDEM TARTRATE 5 MG PO TABS
5.0000 mg | ORAL_TABLET | Freq: Every evening | ORAL | Status: DC | PRN
  Administered 2019-07-09: 5 mg via ORAL
  Filled 2019-07-08: qty 1

## 2019-07-08 MED ORDER — APIXABAN 5 MG PO TABS
5.0000 mg | ORAL_TABLET | Freq: Two times a day (BID) | ORAL | Status: DC
  Administered 2019-07-09 – 2019-07-11 (×6): 5 mg via ORAL
  Filled 2019-07-08 (×6): qty 1

## 2019-07-08 MED ORDER — CARVEDILOL 6.25 MG PO TABS
6.2500 mg | ORAL_TABLET | Freq: Two times a day (BID) | ORAL | Status: DC
  Administered 2019-07-09 – 2019-07-11 (×6): 6.25 mg via ORAL
  Filled 2019-07-08: qty 1
  Filled 2019-07-08: qty 2
  Filled 2019-07-08 (×2): qty 1
  Filled 2019-07-08 (×2): qty 2

## 2019-07-08 MED ORDER — INSULIN GLARGINE 100 UNIT/ML ~~LOC~~ SOLN
30.0000 [IU] | Freq: Every day | SUBCUTANEOUS | Status: DC
  Administered 2019-07-09: 30 [IU] via SUBCUTANEOUS
  Filled 2019-07-08 (×3): qty 0.3

## 2019-07-08 MED ORDER — LEVOTHYROXINE SODIUM 75 MCG PO TABS
75.0000 ug | ORAL_TABLET | Freq: Every day | ORAL | Status: DC
  Administered 2019-07-09 – 2019-07-11 (×3): 75 ug via ORAL
  Filled 2019-07-08 (×3): qty 1

## 2019-07-08 MED ORDER — AMLODIPINE BESYLATE 10 MG PO TABS
10.0000 mg | ORAL_TABLET | Freq: Every day | ORAL | Status: DC
  Administered 2019-07-09 – 2019-07-11 (×3): 10 mg via ORAL
  Filled 2019-07-08: qty 2
  Filled 2019-07-08 (×2): qty 1

## 2019-07-08 MED ORDER — ASPIRIN EC 81 MG PO TBEC
81.0000 mg | DELAYED_RELEASE_TABLET | Freq: Every day | ORAL | Status: DC
  Administered 2019-07-09 – 2019-07-11 (×3): 81 mg via ORAL
  Filled 2019-07-08 (×3): qty 1

## 2019-07-08 MED ORDER — ACETAMINOPHEN 325 MG PO TABS
650.0000 mg | ORAL_TABLET | Freq: Four times a day (QID) | ORAL | Status: DC | PRN
  Administered 2019-07-09 (×2): 650 mg via ORAL
  Filled 2019-07-08 (×2): qty 2

## 2019-07-08 MED ORDER — PANTOPRAZOLE SODIUM 40 MG PO TBEC
40.0000 mg | DELAYED_RELEASE_TABLET | Freq: Every day | ORAL | Status: DC
  Administered 2019-07-09 – 2019-07-10 (×3): 40 mg via ORAL
  Filled 2019-07-08 (×3): qty 1

## 2019-07-08 MED ORDER — NITROGLYCERIN 0.4 MG SL SUBL: 0.4000 mg | SUBLINGUAL_TABLET | SUBLINGUAL | Status: DC | PRN

## 2019-07-08 MED ORDER — ATORVASTATIN CALCIUM 80 MG PO TABS
80.0000 mg | ORAL_TABLET | Freq: Every day | ORAL | Status: DC
  Administered 2019-07-09 – 2019-07-10 (×3): 80 mg via ORAL
  Filled 2019-07-08 (×2): qty 1
  Filled 2019-07-08: qty 2

## 2019-07-08 MED ORDER — SODIUM CHLORIDE 0.9% FLUSH: 3.0000 mL | Freq: Once | INTRAVENOUS | Status: DC

## 2019-07-08 MED ORDER — FINASTERIDE 5 MG PO TABS
5.0000 mg | ORAL_TABLET | Freq: Every day | ORAL | Status: DC
  Administered 2019-07-09 – 2019-07-11 (×3): 5 mg via ORAL
  Filled 2019-07-08 (×3): qty 1

## 2019-07-08 MED ORDER — ISOSORBIDE MONONITRATE ER 60 MG PO TB24
30.0000 mg | ORAL_TABLET | Freq: Every day | ORAL | Status: DC
  Administered 2019-07-09: 30 mg via ORAL
  Filled 2019-07-08: qty 1

## 2019-07-08 NOTE — ED Triage Notes (Signed)
Patient complains of substernal chest pain for the past few days. Patient received 324mg  aspirin and 1 sublingual nitro in route. Patient states his pain is worse with exertion.

## 2019-07-08 NOTE — H&P (Addendum)
History and Physical    Kevin Ballard KXF:[id number] DOB: April 02, 1956 DOA: 07/08/2019  PCP: Patient, No Pcp Per   Patient coming from: Home.   I have personally briefly reviewed patient's old medical records in [facility]  Chief Complaint: Chest pain.  HPI: Kevin Ballard is a 64 y.o. male with medical history significant of paroxysmal atrial fibrillation, BPH, CAD/history of MI/CABG, type 2 diabetes, GERD, hyperlipidemia, hypertension who is coming to the emergency department with complaints of precordial chest pain radiated to his left arm associated with dyspnea, clamminess, which is triggered by exertion and relieved by rest for the past 3 to 4 days.  He denies dizziness, palpitations, PND, orthopnea or recent pitting edema of the lower extremities.  The patient had a similar episode in July 2019 and underwent cardiac cath which showed obstructive disease in multiple vessels (see attached results below).  He had an earlier EKG while he was having pain at an outside facility which showed an incomplete RBBB showing on leads V2.  This was transient and disappeared on the EKG that he got today in the ED, but at that moment he was pain-free.  He has been seen by cardiology and is scheduled for testing on Monday. He denies fever, chills, rhinorrhea or sore throat.  No wheezing or hemoptysis.  Denies abdominal pain, nausea, emesis, diarrhea, constipation, melena or hematochezia.  No dysuria, frequency or hematuria.  No polyuria, polydipsia, polyphagia or blurred vision.  ED Course: Initial vital signs were temperature Initial vital signs temperature 98.4 F, pulse 71, respiration 13, blood pressure 137/62 mmHg and O2 sat 100% on room air.  CBC was normal.  BMP shows a glucose of 168 and creatinine of 1.25 mg/dL, all other results are within expected range.  LFTs show a total protein of 6.1, but the rest of the values were normal.  Troponin x2 was negative.  His chest radiograph did not show cardiomegaly,  vascular congestion, but show mild atelectasis.  Review of Systems: As per HPI otherwise 10 point review of systems negative.   Past Medical History:  Diagnosis Date  . Atrial fibrillation (HCC)   . BPH (benign prostatic hyperplasia)   . Coronary artery disease   . Coronary artery disease involving coronary bypass graft   . Diabetes mellitus without complication (HCC)   . GERD (gastroesophageal reflux disease)   . Hypercholesterolemia   . Hypertension   . Myocardial infarct Kevin Ballard)     Past Surgical History:  Procedure Laterality Date  . LEFT HEART CATH AND CORS/GRAFTS ANGIOGRAPHY N/A 12/21/2017   Procedure: LEFT HEART CATH AND CORS/GRAFTS ANGIOGRAPHY;  Surgeon: [place], Peter M, MD;  Location: [facility] INVASIVE CV LAB;  Service: Cardiovascular;  Laterality: N/A;  . LUMBAR FUSION    . MEDIAL COLLATERAL LIGAMENT REPAIR, KNEE    . TIBIAL FASCIOTOMY    . Kimber Relic       reports that he has never smoked. He has never used smokeless tobacco. He reports that he does not drink alcohol or use drugs.  Allergies  Allergen Reactions  . Brilinta [Ticagrelor] Shortness Of Breath    Family History  Problem Relation Age of Onset  . Heart attack Mother   . Cancer Mother   . Heart attack Maternal Grandmother    Prior to Admission medications   Medication Sig Start Date End Date Taking? Authorizing Provider  acetaminophen (TYLENOL) 325 MG tablet Take 2 tablets (650 mg total) by mouth every 4 (four) hours as needed for headache or mild pain.  12/22/17   Lonia Blood, MD  amLODipine (NORVASC) 10 MG tablet Take 10 mg by mouth daily.    [provider]  apixaban (ELIQUIS) 5 MG TABS tablet Take 5 mg by mouth 2 (two) times daily.    [provider]  aspirin EC 81 MG tablet Take 81 mg by mouth daily.    [provider]  atorvastatin (LIPITOR) 80 MG tablet Take 80 mg by mouth at bedtime.     [provider]  carvedilol (COREG) 6.25 MG tablet Take 1 tablet (6.25 mg  total) by mouth 2 (two) times daily with a meal. 12/22/17   Lonia Blood, MD  docusate sodium (COLACE) 100 MG capsule Take 100 mg by mouth 2 (two) times daily.    [provider]  doxycycline (VIBRAMYCIN) 100 MG capsule Take 1 capsule (100 mg total) by mouth 2 (two) times daily. 04/01/19   Kevin Laine, MD  finasteride (PROSCAR) 5 MG tablet Take 5 mg by mouth daily.    [provider]  insulin glargine (LANTUS) 100 UNIT/ML injection Inject 30 Units into the skin at bedtime.     [provider]  isosorbide mononitrate (IMDUR) 30 MG 24 hr tablet Take 1 tablet (30 mg total) by mouth daily. Patient taking differently: Take 30 mg by mouth at bedtime.  12/23/17   Lonia Blood, MD  levothyroxine (SYNTHROID) 75 MCG tablet Take 75 mcg by mouth daily before breakfast.    [provider]  metFORMIN (GLUCOPHAGE) 1000 MG tablet Take 1,000 mg by mouth daily.    [provider]  nitroGLYCERIN (NITROSTAT) 0.4 MG SL tablet Place 0.4 mg under the tongue every 5 (five) minutes as needed for chest pain.     [provider]  omeprazole (PRILOSEC) 20 MG capsule Take 20 mg by mouth every evening.     [provider]  ranolazine (RANEXA) 1000 MG SR tablet Take 1 tablet (1,000 mg total) by mouth 2 (two) times daily. 12/22/17   Lonia Blood, MD  tamsulosin (FLOMAX) 0.4 MG CAPS capsule Take 0.4 mg by mouth daily.    [provider]    Physical Exam: Vitals:   07/08/19 1930 07/08/19 2000 07/08/19 2030 07/08/19 2100  BP: (!) 118/55 (!) 119/56 (!) 119/56 (!) 128/57  Pulse: 70 70 68 74  Resp: 16 18 10 17   Temp:      TempSrc:      SpO2: 98% 98% 98% 98%  Weight:      Height:        Constitutional: NAD, calm, comfortable Eyes: PERRL, lids and conjunctivae normal ENMT: Mucous membranes are moist. Posterior pharynx clear of any exudate or lesions.Normal dentition.  Neck: normal, supple, no masses, no thyromegaly Respiratory: Decreased  breath sounds in bases, otherwise clear to auscultation bilaterally, no wheezing, no crackles. Normal respiratory effort. No accessory muscle use.  Cardiovascular: Regular rate and rhythm, no murmurs / rubs / gallops. No extremity edema. 2+ pedal pulses. No carotid bruits.  Abdomen: Nondistended, obese.  Bowel sounds positive.  Soft, no tenderness, no masses palpated. No hepatosplenomegaly. Musculoskeletal: no clubbing / cyanosis. Good ROM, no contractures. Normal muscle tone.  Skin: no rashes, lesions, ulcers on limited dermatological examination. Neurologic: CN 2-12 grossly intact. Sensation intact, DTR normal. Strength 5/5 in all 4.  Psychiatric: Normal judgment and insight. Alert and oriented x 3. Normal mood.   Labs on Admission: I have personally reviewed following labs and imaging studies  CBC: Recent Labs  Lab 07/08/19 1845  WBC 6.5  HGB 14.0  HCT 42.7  MCV 92.4  PLT 166   Basic Metabolic Panel: Recent Labs  Lab 07/08/19 1845  NA 137  K 4.4  CL 102  CO2 23  GLUCOSE 168*  BUN 21  CREATININE 1.25*  CALCIUM 9.0   GFR: Estimated Creatinine Clearance: 68.6 mL/min (A) (by C-G formula based on SCr of 1.25 mg/dL (H)). Liver Function Tests: Recent Labs  Lab 07/08/19 1845  AST 17  ALT 20  ALKPHOS 47  BILITOT 0.8  PROT 6.1*  ALBUMIN 3.8   No results for input(s): LIPASE, AMYLASE in the last 168 hours. No results for input(s): AMMONIA in the last 168 hours. Coagulation Profile: No results for input(s): INR, PROTIME in the last 168 hours. Cardiac Enzymes: No results for input(s): CKTOTAL, CKMB, CKMBINDEX, TROPONINI in the last 168 hours. BNP (last 3 results) No results for input(s): PROBNP in the last 8760 hours. HbA1C: No results for input(s): HGBA1C in the last 72 hours. CBG: No results for input(s): GLUCAP in the last 168 hours. Lipid Profile: No results for input(s): CHOL, HDL, LDLCALC, TRIG, CHOLHDL, LDLDIRECT in the last 72 hours. Thyroid Function  Tests: No results for input(s): TSH, T4TOTAL, FREET4, T3FREE, THYROIDAB in the last 72 hours. Anemia Panel: No results for input(s): VITAMINB12, FOLATE, FERRITIN, TIBC, IRON, RETICCTPCT in the last 72 hours. Urine analysis: No results found for: COLORURINE, APPEARANCEUR, [place], [place], GLUCOSEU, HGBUR, BILIRUBINUR, KETONESUR, PROTEINUR, UROBILINOGEN, NITRITE, LEUKOCYTESUR  Radiological Exams on Admission: DG Chest Portable 1 View  Result Date: 07/08/2019 CLINICAL DATA:  Chest pain. EXAM: PORTABLE CHEST 1 VIEW COMPARISON:  April 01, 2019 FINDINGS: Multiple sternal wires and vascular clips are seen. A very mild amount of atelectasis and/or infiltrate is seen within the bilateral lung bases. There is no evidence of a pleural effusion or pneumothorax. The heart size and mediastinal contours are within normal limits. A radiopaque fusion plate and screws are seen overlying the cervical spine. Chronic fifth and 6 right rib fractures are seen. Multilevel degenerative changes seen throughout the thoracic spine. IMPRESSION: 1. Evidence of prior median sternotomy/CABG. 2. Very mild bibasilar atelectasis and/or infiltrate. 3. Postoperative changes within the cervical spine. Electronically Signed   By: Virgina Norfolk M.D.   On: 07/08/2019 19:22   12/21/2017 echocardiogram   RPDA-1 lesion is 50% stenosed.  RPDA-2 lesion is 70% stenosed.  Mid RCA lesion is 35% stenosed.  Prox Cx to Mid Cx lesion is 70% stenosed.  Ost 1st Mrg lesion is 85% stenosed.  Ost 1st Diag lesion is 90% stenosed.  Prox LAD lesion is 70% stenosed.  Prox LAD to Mid LAD lesion is 100% stenosed.  Prox Graft lesion is 50% stenosed.  SVG graft was visualized by angiography and is normal in caliber.  SVG graft was visualized by angiography and is very large.  The graft exhibits no disease.  LIMA graft was visualized by angiography and is normal in caliber.  The graft exhibits no disease.  The left ventricular  systolic function is normal.  LV end diastolic pressure is mildly elevated.  The left ventricular ejection fraction is 55-65% by visual estimate.   1. Severe 2 vessel obstructive CAD.     - 100% mid LAD    - 90% first diagonal    - 70% mid LCx    - 85% OM 1    - there is a high RV marginal branch with 70% stenosis.    - 70% distal PDA  2. Patent LIMA to the LAD 3. Patent SVG to the diagonal. The SVG has a stent proximally with a focal 50% stenosis 4. Patent SVG to the OM 1 5. Normal LV function 6. Normal LVEDP  Plan: I do not see anything angiographically that would explain his prolonged resting chest pain. He does have chronic small vessel disease. His grafts are patent. Would recommend continued medical therapy. Will discontinue IV Ntg and resume po. Titrate antianginal therapy as BP allows. Can resume Eliquis in am.   EKG: Independently reviewed. Vent. rate 74 BPM PR interval * ms QRS duration 92 ms QT/QTc 418/464 ms P-R-T axes 78 137 15 Sinus rhythm Probable inferior infarct, old  Assessment/Plan Principal Problem:   Chest pain Observation/telemetry. Continue supplemental oxygen. Continue beta-blocker. Continue indoor Imdur 30 mg p.o. daily. Sublingual nitroglycerin as needed for CP. Fentanyl 50 mcg IV as needed for chest pain. Get echocardiogram in the morning. Discuss disposition with cardiology after echo.  Active Problems:   CAD (coronary artery disease) of bypass graft Continue Ranexa and aspirin. Continue atorvastatin and carvedilol. Check echocardiogram in a.m. He is scheduled Monday for cardiac work-up.    Type 2 diabetes mellitus with other specified complication (HCC) Carbohydrate modified diet. Continue long-acting insulin 30 units SQ. Continue Metformin 1000 milligrams p.o. daily. CBG monitoring before meals and bedtime.    Paroxysmal atrial fibrillation (HCC) CHA?DS?-VASc Score of at least 4. On apixaban. On carvedilol for rate control     HTN (hypertension) Continue Coreg 6.25 mg p.o. twice daily. Continue amlodipine 10 mg p.o. daily. Monitor blood pressure and heart rate.    HLD (hyperlipidemia) Continue atorvastatin 80 mg p.o. daily.    BPH (benign prostatic hyperplasia) Continue finasteride 5 mg p.o. daily. Continue tamsulosin 0.4 mg p.o. daily.    GERD (gastroesophageal reflux disease) Continue PPI.   DVT prophylaxis: Apixaban. Code Status: Full code. Family Communication:  Disposition Plan: Observation overnight/echo in a.m. Consults called:  Admission status: Observation/telemetry.   Bobette Mo MD [facility]  If 7PM-7AM, please contact night-coverage [email or web address]  07/08/2019, 9:20 PM   This document was prepared using Dragon voice recognition software and may contain some unintended transcription errors.

## 2019-07-08 NOTE — ED Provider Notes (Signed)
[facility] EMERGENCY DEPARTMENT Provider Note   CSN: [id number] Arrival date & time: 07/08/19  1827     History Chief Complaint  Patient presents with  . Chest Pain    Kevin Ballard is a 64 y.o. male.  Patient states for the last few days he has been having some chest pain shortness of breath and sweating when he exerts himself.  He does not have discomfort lying.  Patient has a history of 4 stents in his heart and a triple bypass.  Cardiac cath was last year  The history is provided by the patient. No language interpreter was used.  Chest Pain Pain location:  L chest Pain quality: aching   Pain radiates to:  Does not radiate Pain severity:  Moderate Onset quality:  Sudden Timing:  Intermittent Progression:  Waxing and waning Chronicity:  Recurrent Context: movement   Context: not breathing   Relieved by:  Rest Associated symptoms: no abdominal pain, no back pain, no cough, no fatigue and no headache        Past Medical History:  Diagnosis Date  . Atrial fibrillation (HCC)   . BPH (benign prostatic hyperplasia)   . Coronary artery disease   . Coronary artery disease involving coronary bypass graft   . Diabetes mellitus without complication (HCC)   . GERD (gastroesophageal reflux disease)   . Hypercholesterolemia   . Hypertension   . Myocardial infarct [facility])     Patient Active Problem List   Diagnosis Date Noted  . Chest pain 07/08/2019  . Pneumonia due to COVID-19 virus 03/22/2019  . Hypophosphatemia 03/22/2019  . Hypokalemia 03/22/2019  . Hyponatremia 03/22/2019  . GERD (gastroesophageal reflux disease)   . CAD (coronary artery disease) of bypass graft 12/19/2017  . Type 2 diabetes mellitus with other specified complication (HCC) 12/19/2017  . Paroxysmal atrial fibrillation (HCC) 12/19/2017  . Chronic anticoagulation 12/19/2017  . HTN (hypertension) 12/19/2017  . HLD (hyperlipidemia) 12/19/2017  . BPH (benign prostatic hyperplasia) 12/19/2017    Past  Surgical History:  Procedure Laterality Date  . LEFT HEART CATH AND CORS/GRAFTS ANGIOGRAPHY N/A 12/21/2017   Procedure: LEFT HEART CATH AND CORS/GRAFTS ANGIOGRAPHY;  Surgeon: [place]il[place], Peter M, MD;  Location: [facility] INVASIVE CV LAB;  Service: Cardiovascular;  Laterality: N/A;  . LUMBAR FUSION    . MEDIAL COLLATERAL LIGAMENT REPAIR, KNEE    . TIBIAL FASCIOTOMY    . tibiastomy         Family History  Problem Relation Age of Onset  . Heart attack Mother   . Cancer Mother   . Heart attack Maternal Grandmother     Social History   Tobacco Use  . Smoking status: Never Smoker  . Smokeless tobacco: Never Used  Substance Use Topics  . Alcohol use: Never  . Drug use: Never    Home Medications Prior to Admission medications   Medication Sig Start Date End Date Taking? Authorizing Provider  acetaminophen (TYLENOL) 325 MG tablet Take 2 tablets (650 mg total) by mouth every 4 (four) hours as needed for headache or mild pain. 12/22/17   Lonia Blood, MD  amLODipine (NORVASC) 10 MG tablet Take 10 mg by mouth daily.    [provider]  apixaban (ELIQUIS) 5 MG TABS tablet Take 5 mg by mouth 2 (two) times daily.    [provider]  aspirin EC 81 MG tablet Take 81 mg by mouth daily.    [provider]  atorvastatin (LIPITOR) 80 MG tablet Take 80 mg  by mouth at bedtime.     [provider]  carvedilol (COREG) 6.25 MG tablet Take 1 tablet (6.25 mg total) by mouth 2 (two) times daily with a meal. 12/22/17   Lonia Blood, MD  docusate sodium (COLACE) 100 MG capsule Take 100 mg by mouth 2 (two) times daily.    [provider]  doxycycline (VIBRAMYCIN) 100 MG capsule Take 1 capsule (100 mg total) by mouth 2 (two) times daily. 04/01/19   Cathren Laine, MD  finasteride (PROSCAR) 5 MG tablet Take 5 mg by mouth daily.    [provider]  insulin glargine (LANTUS) 100 UNIT/ML injection Inject 30 Units into the skin at bedtime.     [provider]  isosorbide mononitrate (IMDUR) 30 MG 24 hr tablet Take 1 tablet (30 mg total) by mouth daily. Patient taking differently: Take 30 mg by mouth at bedtime.  12/23/17   Lonia Blood, MD  levothyroxine (SYNTHROID) 75 MCG tablet Take 75 mcg by mouth daily before breakfast.    [provider]  metFORMIN (GLUCOPHAGE) 1000 MG tablet Take 1,000 mg by mouth daily.    [provider]  nitroGLYCERIN (NITROSTAT) 0.4 MG SL tablet Place 0.4 mg under the tongue every 5 (five) minutes as needed for chest pain.     [provider]  omeprazole (PRILOSEC) 20 MG capsule Take 20 mg by mouth every evening.     [provider]  ranolazine (RANEXA) 1000 MG SR tablet Take 1 tablet (1,000 mg total) by mouth 2 (two) times daily. 12/22/17   Lonia Blood, MD  tamsulosin (FLOMAX) 0.4 MG CAPS capsule Take 0.4 mg by mouth daily.    [provider]    Allergies    Brilinta [ticagrelor]  Review of Systems   Review of Systems  Constitutional: Negative for appetite change and fatigue.  HENT: Negative for congestion, ear discharge and sinus pressure.   Eyes: Negative for discharge.  Respiratory: Negative for cough.   Cardiovascular: Positive for chest pain.  Gastrointestinal: Negative for abdominal pain and diarrhea.  Genitourinary: Negative for frequency and hematuria.  Musculoskeletal: Negative for back pain.  Skin: Negative for rash.  Neurological: Negative for seizures and headaches.  Psychiatric/Behavioral: Negative for hallucinations.    Physical Exam Updated Vital Signs BP (!) 128/57   Pulse 74   Temp 98.4 F (36.9 C) (Oral)   Resp 17   Ht 5\' 5"  (1.651 m)   Wt 113.4 kg   SpO2 98%   BMI 41.60 kg/m   Physical Exam Vitals and nursing note reviewed.  Constitutional:      Appearance: He is well-developed.  HENT:     Head: Normocephalic.     Nose: Nose normal.  Eyes:     General: No scleral icterus.    Conjunctiva/sclera: Conjunctivae normal.    Neck:     Thyroid: No thyromegaly.  Cardiovascular:     Rate and Rhythm: Normal rate and regular rhythm.     Heart sounds: No murmur. No friction rub. No gallop.   Pulmonary:     Breath sounds: No stridor. No wheezing or rales.  Chest:     Chest wall: No tenderness.  Abdominal:     General: There is no distension.     Tenderness: There is no abdominal tenderness. There is no rebound.  Musculoskeletal:        General: Normal range of motion.     Cervical back: Neck supple.  Lymphadenopathy:  Cervical: No cervical adenopathy.  Skin:    Findings: No erythema or rash.  Neurological:     Mental Status: He is alert and oriented to person, place, and time.     Motor: No abnormal muscle tone.     Coordination: Coordination normal.  Psychiatric:        Behavior: Behavior normal.     ED Results / Procedures / Treatments   Labs (all labs ordered are listed, but only abnormal results are displayed) Labs Reviewed  BASIC METABOLIC PANEL - Abnormal; Notable for the following components:      Result Value   Glucose, Bld 168 (*)    Creatinine, Ser 1.25 (*)    All other components within normal limits  HEPATIC FUNCTION PANEL - Abnormal; Notable for the following components:   Total Protein 6.1 (*)    All other components within normal limits  RESPIRATORY PANEL BY RT PCR (FLU A&B, COVID)  CBC  TROPONIN I (HIGH SENSITIVITY)  TROPONIN I (HIGH SENSITIVITY)    EKG EKG Interpretation  Date/Time:  Friday July 08 2019 18:34:17 EST Ventricular Rate:  74 PR Interval:    QRS Duration: 92 QT Interval:  418 QTC Calculation: 464 R Axis:   137 Text Interpretation: Sinus rhythm Probable inferior infarct, old Confirmed by Milton Ferguson [phone]) on 07/08/2019 6:48:33 PM   Radiology DG Chest Portable 1 View  Result Date: 07/08/2019 CLINICAL DATA:  Chest pain. EXAM: PORTABLE CHEST 1 VIEW COMPARISON:  April 01, 2019 FINDINGS: Multiple sternal wires and vascular clips are seen. A very  mild amount of atelectasis and/or infiltrate is seen within the bilateral lung bases. There is no evidence of a pleural effusion or pneumothorax. The heart size and mediastinal contours are within normal limits. A radiopaque fusion plate and screws are seen overlying the cervical spine. Chronic fifth and 6 right rib fractures are seen. Multilevel degenerative changes seen throughout the thoracic spine. IMPRESSION: 1. Evidence of prior median sternotomy/CABG. 2. Very mild bibasilar atelectasis and/or infiltrate. 3. Postoperative changes within the cervical spine. Electronically Signed   By: Virgina Norfolk M.D.   On: 07/08/2019 19:22    Procedures Procedures (including critical care time)  Medications Ordered in ED Medications - No data to display  ED Course  I have reviewed the triage vital signs and the nursing notes.  Pertinent labs & imaging results that were available during my care of the patient were reviewed by me and considered in my medical decision making (see chart for details).    MDM Rules/Calculators/A&P                     Labs unremarkable.  Patient with angina on exertion.  He will be admitted to medicine Final Clinical Impression(s) / ED Diagnoses Final diagnoses:  Angina at rest [facility])    Rx / DC Orders ED Discharge Orders    None       Milton Ferguson, MD 07/08/19 2124

## 2019-07-09 ENCOUNTER — Other Ambulatory Visit: Payer: Self-pay

## 2019-07-09 ENCOUNTER — Observation Stay (HOSPITAL_BASED_OUTPATIENT_CLINIC_OR_DEPARTMENT_OTHER)

## 2019-07-09 DIAGNOSIS — K219 Gastro-esophageal reflux disease without esophagitis: Secondary | ICD-10-CM

## 2019-07-09 DIAGNOSIS — E7800 Pure hypercholesterolemia, unspecified: Secondary | ICD-10-CM

## 2019-07-09 DIAGNOSIS — R079 Chest pain, unspecified: Secondary | ICD-10-CM | POA: Diagnosis not present

## 2019-07-09 DIAGNOSIS — E1169 Type 2 diabetes mellitus with other specified complication: Secondary | ICD-10-CM

## 2019-07-09 DIAGNOSIS — Z794 Long term (current) use of insulin: Secondary | ICD-10-CM

## 2019-07-09 DIAGNOSIS — N400 Enlarged prostate without lower urinary tract symptoms: Secondary | ICD-10-CM

## 2019-07-09 DIAGNOSIS — I10 Essential (primary) hypertension: Secondary | ICD-10-CM

## 2019-07-09 DIAGNOSIS — E669 Obesity, unspecified: Secondary | ICD-10-CM

## 2019-07-09 DIAGNOSIS — I208 Other forms of angina pectoris: Secondary | ICD-10-CM | POA: Diagnosis not present

## 2019-07-09 DIAGNOSIS — I480 Paroxysmal atrial fibrillation: Secondary | ICD-10-CM

## 2019-07-09 LAB — HEMOGLOBIN A1C
Hgb A1c MFr Bld: 7.7 % — ABNORMAL HIGH (ref 4.8–5.6)
Mean Plasma Glucose: 174.29 mg/dL

## 2019-07-09 LAB — GLUCOSE, CAPILLARY
Glucose-Capillary: 126 mg/dL — ABNORMAL HIGH (ref 70–99)
Glucose-Capillary: 158 mg/dL — ABNORMAL HIGH (ref 70–99)
Glucose-Capillary: 175 mg/dL — ABNORMAL HIGH (ref 70–99)
Glucose-Capillary: 180 mg/dL — ABNORMAL HIGH (ref 70–99)
Glucose-Capillary: 194 mg/dL — ABNORMAL HIGH (ref 70–99)
Glucose-Capillary: 278 mg/dL — ABNORMAL HIGH (ref 70–99)

## 2019-07-09 LAB — HIV ANTIBODY (ROUTINE TESTING W REFLEX): HIV Screen 4th Generation wRfx: NONREACTIVE

## 2019-07-09 LAB — ECHOCARDIOGRAM COMPLETE
Height: 69 in
Weight: 3996.8 oz

## 2019-07-09 MED ORDER — FENTANYL CITRATE (PF) 100 MCG/2ML IJ SOLN: 50.0000 ug | INTRAMUSCULAR | Status: DC | PRN

## 2019-07-09 MED ORDER — INSULIN ASPART 100 UNIT/ML ~~LOC~~ SOLN: 0.0000 [IU] | Freq: Every day | SUBCUTANEOUS | Status: DC

## 2019-07-09 MED ORDER — INSULIN GLARGINE 100 UNIT/ML ~~LOC~~ SOLN
40.0000 [IU] | Freq: Every day | SUBCUTANEOUS | Status: DC
  Administered 2019-07-09 – 2019-07-11 (×2): 40 [IU] via SUBCUTANEOUS
  Filled 2019-07-09 (×5): qty 0.4

## 2019-07-09 MED ORDER — TRAZODONE HCL 50 MG PO TABS
50.0000 mg | ORAL_TABLET | Freq: Once | ORAL | Status: AC
  Administered 2019-07-09: 50 mg via ORAL
  Filled 2019-07-09: qty 1

## 2019-07-09 MED ORDER — ISOSORBIDE MONONITRATE ER 60 MG PO TB24
60.0000 mg | ORAL_TABLET | Freq: Every day | ORAL | Status: DC
  Administered 2019-07-10: 60 mg via ORAL
  Filled 2019-07-09: qty 1

## 2019-07-09 MED ORDER — ENSURE ENLIVE PO LIQD
237.0000 mL | Freq: Two times a day (BID) | ORAL | Status: DC
  Administered 2019-07-09: 237 mL via ORAL

## 2019-07-09 MED ORDER — INSULIN ASPART 100 UNIT/ML ~~LOC~~ SOLN
0.0000 [IU] | Freq: Three times a day (TID) | SUBCUTANEOUS | Status: DC
  Administered 2019-07-09 (×2): 3 [IU] via SUBCUTANEOUS
  Administered 2019-07-09: 8 [IU] via SUBCUTANEOUS
  Administered 2019-07-10: 3 [IU] via SUBCUTANEOUS
  Administered 2019-07-10: 5 [IU] via SUBCUTANEOUS
  Administered 2019-07-10 – 2019-07-11 (×2): 3 [IU] via SUBCUTANEOUS
  Administered 2019-07-11: 2 [IU] via SUBCUTANEOUS

## 2019-07-09 NOTE — Progress Notes (Signed)
PROGRESS NOTE    Kevin Ballard  KGU:[id number] DOB: May 12, 1956 DOA: 07/08/2019 PCP: Patient, No Pcp Per    Brief Narrative:  64 y.o. male with medical history significant of paroxysmal atrial fibrillation, BPH, CAD/history of MI/CABG, type 2 diabetes, GERD, hyperlipidemia, hypertension who is coming to the emergency department with complaints of precordial chest pain radiated to his left arm associated with dyspnea, clamminess, which is triggered by exertion and relieved by rest for the past 3 to 4 days.  He denies dizziness, palpitations, PND, orthopnea or recent pitting edema of the lower extremities.  The patient had a similar episode in July 2019 and underwent cardiac cath which showed obstructive disease in multiple vessels (see attached results below).  He had an earlier EKG while he was having pain at an outside facility which showed an incomplete RBBB showing on leads V2.  This was transient and disappeared on the EKG that he got today in the ED, but at that moment he was pain-free.  He has been seen by cardiology and is scheduled for testing on Monday. He denies fever, chills, rhinorrhea or sore throat.  No wheezing or hemoptysis.  Denies abdominal pain, nausea, emesis, diarrhea, constipation, melena or hematochezia.  No dysuria, frequency or hematuria.  No polyuria, polydipsia, polyphagia or blurred vision.  ED Course: Initial vital signs were temperature Initial vital signs temperature 98.4 F, pulse 71, respiration 13, blood pressure 137/62 mmHg and O2 sat 100% on room air.   Assessment & Plan: 1-Chest pain: -Heart score 6 -Telemetry/EKG without acute ischemic changes -Patient is still having chest discomfort on exertion -Will continue beta-blocker, Ranexa, Imdur, statins and aspirin -2D echo reassuring -Case discussed with cardiology service and patient will be transferred to [facility] for a stress test on 07/10/2019 -Imdur dose adjusted to 60mg  daily as per rec's by  cardiologist.  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  5-history of paroxysmal atrial fibrillation -Rate control -Continue the use of beta-blockers and Eliquis for secondary prevention.  6-hypothyroidism -Continue Synthroid.  7-history of BPH -Patient reports no urinary retention symptoms -Continue Flomax and Proscar  8-GERD -Continue PPI  9-class II obesity -Low calorie diet, portion control and increase physical activity discussed with patient. -Body mass index is 36.89 kg/m.   DVT prophylaxis: Chronically on Eliquis. Code Status: Full code Family Communication: No family at bedside. Disposition Plan: Case has been discussed with cardiology who at this moment given patient elevated heart score and history recommends cardiac stress test.  Patient will be transferred to [facility] today, kept nothing by mouth after midnight and a stress test to be done on 07/10/2019.  Imdur increased to 60 mg daily as recommended by cardiology.  At this moment Dr. 07/12/2019 will take care over the patient's care when he arrived to [facility].  Consultants:   Neurology service  Procedures:   See below for x-ray reports  2D echo:  1. Left ventricular ejection fraction, by visual estimation, is 60 to 65%. The left ventricle has normal function. There is severely increased left ventricular hypertrophy.  2. Elevated left atrial pressure.  3. Left ventricular diastolic parameters are consistent with Grade II diastolic dysfunction (pseudonormalization).  4. The left ventricle has no regional wall motion abnormalities.  5. Global right ventricle has normal systolic  function.The right  ventricular size is normal.  6. Left atrial size was normal.  7. Right atrial size was normal.  8. Mild mitral annular calcification.  9. The mitral valve is normal in structure. No evidence of mitral valve regurgitation. No evidence of mitral stenosis. 10. The tricuspid valve is normal in structure. Tricuspid valve regurgitation is trivial. 11. The aortic valve is tricuspid. Aortic valve regurgitation is not visualized. Mild aortic valve sclerosis without stenosis. 12. The pulmonic valve was normal in structure. Pulmonic valve regurgitation is not visualized. 13. The inferior vena cava is normal in size with greater than 50% respiratory variability, suggesting right atrial pressure of 3 mmHg. 14. Normal LV systolic function; severe LVH; grade 2 diastolic dysfunction.  Antimicrobials:  Anti-infectives (From admission, onward)   None     Subjective: Afebrile, no nausea, no vomiting.  Patient reports intermittent 5-10-minute chest discomfort on exertion is still present this morning while using the bathroom.  No diaphoresis, no palpitations, no other acute complaints.  Objective: Vitals:   07/08/19 2200 07/08/19 2259 07/09/19 0451 07/09/19 0812  BP: (!) 128/51 (!) 153/62 (!) 124/56 (!) 127/58  Pulse: 67 73 66 69  Resp: 13 20    Temp:  97.9 F (36.6 C) 97.9 F (36.6 C)   TempSrc:  Oral Oral   SpO2: 95% 99% 100% 100%  Weight:  113.3 kg    Height:  5\' 9"  (1.753 m)     No intake or output data in the 24 hours ending 07/09/19 1440 Filed Weights   07/08/19 1831 07/08/19 2259  Weight: 113.4 kg 113.3 kg    Examination: General exam: Alert, awake, oriented x 3, no chest pain at rest, still experiencing intermittent 5-10-minute chest discomfort on exertion.  No nausea, no vomiting, no fever. Respiratory system: Clear to auscultation. Respiratory effort normal. Cardiovascular system:RRR. No rubs, no gallops.  No JVD. Gastrointestinal system: Abdomen is obese, nondistended, soft and  nontender. No organomegaly or masses felt. Normal bowel sounds heard. Central nervous system: Alert and oriented. No focal neurological deficits. Extremities: No C/C/E, +pedal pulses Skin: No rashes, lesions or ulcers Psychiatry: Judgement and insight appear normal. Mood & affect appropriate.     Data Reviewed: I have personally reviewed following labs and imaging studies  CBC: Recent Labs  Lab 07/08/19 1845  WBC 6.5  HGB 14.0  HCT 42.7  MCV 92.4  PLT 342   Basic Metabolic Panel: Recent Labs  Lab 07/08/19 1845  NA 137  K 4.4  CL 102  CO2 23  GLUCOSE 168*  BUN 21  CREATININE 1.25*  CALCIUM 9.0   GFR: Estimated Creatinine Clearance: 73.1 mL/min (A) (by C-G formula based on SCr of 1.25 mg/dL (H)).   Liver Function Tests: Recent Labs  Lab 07/08/19 1845  AST 17  ALT 20  ALKPHOS 47  BILITOT 0.8  PROT 6.1*  ALBUMIN 3.8   HbA1C: Recent Labs    07/09/19 0507  HGBA1C 7.7*   CBG: Recent Labs  Lab 07/09/19 0007 07/09/19 0745 07/09/19 1200  GLUCAP 126* 180* 278*    Recent Results (from the past 240 hour(s))  Respiratory Panel by RT PCR (Flu A&B, Covid) - Nasopharyngeal Swab     Status: None   Collection Time: 07/08/19  7:07 PM   Specimen: Nasopharyngeal Swab  Result Value Ref Range Status   SARS Coronavirus 2 by RT PCR NEGATIVE NEGATIVE Final    Comment: (NOTE) SARS-CoV-2 target nucleic acids are NOT DETECTED. The SARS-CoV-2 RNA is generally  detectable in upper respiratoy specimens during the acute phase of infection. The lowest concentration of SARS-CoV-2 viral copies this assay can detect is 131 copies/mL. A negative result does not preclude SARS-Cov-2 infection and should not be used as the sole basis for treatment or other patient management decisions. A negative result may occur with  improper specimen collection/handling, submission of specimen other than nasopharyngeal swab, presence of viral mutation(s) within the areas targeted by this assay,  and inadequate number of viral copies (<131 copies/mL). A negative result must be combined with clinical observations, patient history, and epidemiological information. The expected result is Negative. Fact Sheet for Patients:  https://www.moore.com/https://www.fda.gov/media/142436/download Fact Sheet for Healthcare Providers:  https://www.young.biz/https://www.fda.gov/media/142435/download This test is not yet ap proved or cleared by the [place] FDA and  has been authorized for detection and/or diagnosis of SARS-CoV-2 by FDA under an Emergency Use Authorization (EUA). This EUA will remain  in effect (meaning this test can be used) for the duration of the COVID-19 declaration under Section 564(b)(1) of the Act, 21 U.S.C. section 360bbb-3(b)(1), unless the authorization is terminated or revoked sooner.    Influenza A by PCR NEGATIVE NEGATIVE Final   Influenza B by PCR NEGATIVE NEGATIVE Final    Comment: (NOTE) The Xpert Xpress SARS-CoV-2/FLU/RSV assay is intended as an aid in  the diagnosis of influenza from Nasopharyngeal swab specimens and  should not be used as a sole basis for treatment. Nasal washings and  aspirates are unacceptable for Xpert Xpress SARS-CoV-2/FLU/RSV  testing. Fact Sheet for Patients: https://www.moore.com/https://www.fda.gov/media/142436/download Fact Sheet for Healthcare Providers: https://www.young.biz/https://www.fda.gov/media/142435/download This test is not yet approved or cleared by the [place] FDA and  has been authorized for detection and/or diagnosis of SARS-CoV-2 by  FDA under an Emergency Use Authorization (EUA). This EUA will remain  in effect (meaning this test can be used) for the duration of the  Covid-19 declaration under Section 564(b)(1) of the Act, 21  U.S.C. section 360bbb-3(b)(1), unless the authorization is  terminated or revoked. Performed at [facility], [address] [place], [place] [address]      Radiology Studies: DG Chest Portable 1 View  Result Date: 07/08/2019 CLINICAL DATA:  Chest pain. EXAM:  PORTABLE CHEST 1 VIEW COMPARISON:  April 01, 2019 FINDINGS: Multiple sternal wires and vascular clips are seen. A very mild amount of atelectasis and/or infiltrate is seen within the bilateral lung bases. There is no evidence of a pleural effusion or pneumothorax. The heart size and mediastinal contours are within normal limits. A radiopaque fusion plate and screws are seen overlying the cervical spine. Chronic fifth and 6 right rib fractures are seen. Multilevel degenerative changes seen throughout the thoracic spine. IMPRESSION: 1. Evidence of prior median sternotomy/CABG. 2. Very mild bibasilar atelectasis and/or infiltrate. 3. Postoperative changes within the cervical spine. Electronically Signed   By: Aram Candelahaddeus  Houston M.D.   On: 07/08/2019 19:22   ECHOCARDIOGRAM COMPLETE  Result Date: 07/09/2019   ECHOCARDIOGRAM REPORT   Patient Name:   Kevin Ballard Date of Exam: 07/09/2019 Medical Rec #:  [id number]  Height:       69.0 in Accession #:    [id number] Weight:       249.8 lb Date of Birth:  16-Sep-1955  BSA:          2.27 m Patient Age:    64 years   BP:           127/58 mmHg Patient Gender: M          HR:  69 bpm. [facility] Procedure: 2D Echo, Cardiac Doppler and Color Doppler Indications:    Chest Pain 786.50 / R07.9  History:        Patient has no prior history of Echocardiogram examinations.                 CAD, Prior CABG; Risk Factors:Hypertension, Diabetes and                 Dyslipidemia. Paroxysmal atrial fibrillation, Obesity.  Sonographer:    Celesta GentileBernard White RCS Referring Phys: [id number] DAVID MANUEL ORTIZ IMPRESSIONS  1. Left ventricular ejection fraction, by visual estimation, is 60 to 65%. The left ventricle has normal function. There is severely increased left ventricular hypertrophy.  2. Elevated left atrial pressure.  3. Left ventricular diastolic parameters are consistent with Grade II diastolic dysfunction (pseudonormalization).  4. The left ventricle has no regional wall  motion abnormalities.  5. Global right ventricle has normal systolic function.The right ventricular size is normal.  6. Left atrial size was normal.  7. Right atrial size was normal.  8. Mild mitral annular calcification.  9. The mitral valve is normal in structure. No evidence of mitral valve regurgitation. No evidence of mitral stenosis. 10. The tricuspid valve is normal in structure. Tricuspid valve regurgitation is trivial. 11. The aortic valve is tricuspid. Aortic valve regurgitation is not visualized. Mild aortic valve sclerosis without stenosis. 12. The pulmonic valve was normal in structure. Pulmonic valve regurgitation is not visualized. 13. The inferior vena cava is normal in size with greater than 50% respiratory variability, suggesting right atrial pressure of 3 mmHg. 14. Normal LV systolic function; severe LVH; grade 2 diastolic dysfunction. FINDINGS  Left Ventricle: Left ventricular ejection fraction, by visual estimation, is 60 to 65%. The left ventricle has normal function. The left ventricle has no regional wall motion abnormalities. There is severely increased left ventricular hypertrophy. Left ventricular diastolic parameters are consistent with Grade II diastolic dysfunction (pseudonormalization). Elevated left atrial pressure. Right Ventricle: The right ventricular size is normal.Global RV systolic function is has normal systolic function. Left Atrium: Left atrial size was normal in size. Right Atrium: Right atrial size was normal in size Pericardium: There is no evidence of pericardial effusion. Mitral Valve: The mitral valve is normal in structure. There is mild calcification of the mitral valve leaflet(s). Mild mitral annular calcification. No evidence of mitral valve regurgitation. No evidence of mitral valve stenosis by observation. Tricuspid Valve: The tricuspid valve is normal in structure. Tricuspid valve regurgitation is trivial. Aortic Valve: The aortic valve is tricuspid. Aortic valve  regurgitation is not visualized. Mild aortic valve sclerosis is present, with no evidence of aortic valve stenosis. Pulmonic Valve: The pulmonic valve was normal in structure. Pulmonic valve regurgitation is not visualized. Pulmonic regurgitation is not visualized. Aorta: The aortic root is normal in size and structure. Venous: The inferior vena cava is normal in size with greater than 50% respiratory variability, suggesting right atrial pressure of 3 mmHg. IAS/Shunts: No atrial level shunt detected by color flow Doppler. Additional Comments: Normal LV systolic function; severe LVH; grade 2 diastolic dysfunction.  LEFT VENTRICLE PLAX 2D LVIDd:         4.67 cm       Diastology LVIDs:         2.92 cm       LV e' lateral:   9.68 cm/s LV PW:         1.28 cm  LV E/e' lateral: 11.6 LV IVS:        1.42 cm       LV e' medial:    6.64 cm/s LVOT diam:     1.60 cm       LV E/e' medial:  16.9 LV SV:         68 ml LV SV Index:   28.43 LVOT Area:     2.01 cm  LV Volumes (MOD) LV area d, A2C:    22.00 cm LV area d, A4C:    30.00 cm LV area s, A2C:    13.00 cm LV area s, A4C:    12.80 cm LV major d, A2C:   7.70 cm LV major d, A4C:   8.14 cm LV major s, A2C:   6.50 cm LV major s, A4C:   6.31 cm LV vol d, MOD A2C: 52.7 ml LV vol d, MOD A4C: 90.9 ml LV vol s, MOD A2C: 22.8 ml LV vol s, MOD A4C: 22.1 ml LV SV MOD A2C:     29.9 ml LV SV MOD A4C:     90.9 ml LV SV MOD BP:      48.4 ml RIGHT VENTRICLE RV S prime:     12.30 cm/s TAPSE (M-mode): 2.1 cm LEFT ATRIUM             Index       RIGHT ATRIUM           Index LA diam:        4.70 cm 2.07 cm/m  RA Area:     16.20 cm LA Vol (A2C):   54.2 ml 23.87 ml/m RA Volume:   37.60 ml  16.56 ml/m LA Vol (A4C):   47.1 ml 20.74 ml/m LA Biplane Vol: 53.2 ml 23.43 ml/m  AORTIC VALVE LVOT Vmax:   111.00 cm/s LVOT Vmean:  72.100 cm/s LVOT VTI:    0.240 m  AORTA Ao Root diam: 3.40 cm MITRAL VALVE MV Area (PHT): 3.85 cm              SHUNTS MV PHT:        57.13 msec            Systemic  VTI:  0.24 m MV Decel Time: 197 msec              Systemic Diam: 1.60 cm MV E velocity: 112.00 cm/s 103 cm/s MV A velocity: 70.30 cm/s  70.3 cm/s MV E/A ratio:  1.59        1.5  Olga Millers MD Electronically signed by Olga Millers MD Signature Date/Time: 07/09/2019/12:43:58 PM    Final     Scheduled Meds: . amLODipine  10 mg Oral Daily  . apixaban  5 mg Oral BID  . aspirin EC  81 mg Oral Daily  . atorvastatin  80 mg Oral QHS  . carvedilol  6.25 mg Oral BID WC  . finasteride  5 mg Oral Daily  . insulin aspart  0-15 Units Subcutaneous TID WC  . insulin aspart  0-5 Units Subcutaneous QHS  . insulin glargine  40 Units Subcutaneous QHS  . isosorbide mononitrate  30 mg Oral Daily  . levothyroxine  75 mcg Oral QAC breakfast  . pantoprazole  40 mg Oral QHS  . ranolazine  1,000 mg Oral BID  . tamsulosin  0.4 mg Oral Daily     LOS: 0 days    Time spent: 30 minutes.    Vassie Loll, MD  [facility] Pager [phone]   07/09/2019, 2:40 PM

## 2019-07-09 NOTE — Progress Notes (Signed)
*  PRELIMINARY RESULTS* Echocardiogram 2D Echocardiogram has been performed.  Stacey Drain 07/09/2019, 12:36 PM

## 2019-07-09 NOTE — Progress Notes (Signed)
Patient being transported to [place] progressive unit for stress test. Report given to the nurse at [facility]. Awaiting for carelink to transport

## 2019-07-09 NOTE — Progress Notes (Signed)
[facility] report given at 1800.

## 2019-07-09 NOTE — Progress Notes (Signed)
Nutrition Brief Note  RD working remotely.  Patient identified on the Malnutrition Screening Tool (MST) Report  Wt Readings from Last 15 Encounters:  07/08/19 113.3 kg  04/01/19 113.4 kg  03/22/19 113.3 kg  03/17/19 113.4 kg  03/12/19 113.4 kg  02/18/18 115.7 kg   Kevin Ballard is a 64 y.o. male with medical history significant of paroxysmal atrial fibrillation, BPH, CAD/history of MI/CABG, type 2 diabetes, GERD, hyperlipidemia, hypertension who is coming to the emergency department with complaints of precordial chest pain radiated to his left arm associated with dyspnea, clamminess, which is triggered by exertion and relieved by rest for the past 3 to 4 days.  He denies dizziness, palpitations, PND, orthopnea or recent pitting edema of the lower extremities.  The patient had a similar episode in July 2019 and underwent cardiac cath which showed obstructive disease in multiple vessels (see attached results below).  He had an earlier EKG while he was having pain at an outside facility which showed an incomplete RBBB showing on leads V2.  This was transient and disappeared on the EKG that he got today in the ED, but at that moment he was pain-free.  He has been seen by cardiology and is scheduled for testing on Monday. He denies fever, chills, rhinorrhea or sore throat.  No wheezing or hemoptysis.  Denies abdominal pain, nausea, emesis, diarrhea, constipation, melena or hematochezia.  No dysuria, frequency or hematuria.  No polyuria, polydipsia, polyphagia or blurred vision.  Pt admitted with chest pain.   Per MD notes, plan for echo this am.  Attempted to speak with pt via phone, however, no answer   Lab Results  Component Value Date   HGBA1C 7.7 (H) 07/09/2019   PTA DM medications are 30 units insulin glargine daily and 1000 mg metformin BID.   Labs reviewed: CBGS: 126-180 (inpatient orders for glycemic control are 0-15 units insulin aspart TID with meals and 0-5 units insulin aspart q HS).    Body mass index is 36.89 kg/m. Patient meets criteria for obesity, class II based on current BMI.   Current diet order is heart healthy/ carb modified, patient is consuming approximately n/a% of meals at this time. Labs and medications reviewed.   No nutrition interventions warranted at this time. If nutrition issues arise, please consult RD.   Kevin Ballard A. Mayford Knife, RD, LDN, CDCES Registered Dietitian II Certified Diabetes Care and Education Specialist Pager: [phone] After hours Pager: [phone]

## 2019-07-10 ENCOUNTER — Observation Stay (HOSPITAL_COMMUNITY)

## 2019-07-10 LAB — GLUCOSE, CAPILLARY
Glucose-Capillary: 162 mg/dL — ABNORMAL HIGH (ref 70–99)
Glucose-Capillary: 160 mg/dL — ABNORMAL HIGH (ref 70–99)
Glucose-Capillary: 245 mg/dL — ABNORMAL HIGH (ref 70–99)
Glucose-Capillary: 175 mg/dL — ABNORMAL HIGH (ref 70–99)

## 2019-07-10 MED ORDER — TECHNETIUM TC 99M TETROFOSMIN IV KIT
10.6800 | PACK | Freq: Once | INTRAVENOUS | Status: AC | PRN
  Administered 2019-07-10: 10.68 via INTRAVENOUS

## 2019-07-10 MED ORDER — REGADENOSON 0.4 MG/5ML IV SOLN
0.4000 mg | Freq: Once | INTRAVENOUS | Status: AC
  Administered 2019-07-10: 10:00:00 0.4 mg via INTRAVENOUS
  Filled 2019-07-10: qty 5

## 2019-07-10 MED ORDER — ISOSORBIDE MONONITRATE ER 60 MG PO TB24
120.0000 mg | ORAL_TABLET | Freq: Every day | ORAL | Status: DC
  Administered 2019-07-11: 120 mg via ORAL
  Filled 2019-07-10: qty 2

## 2019-07-10 MED ORDER — TECHNETIUM TC 99M TETROFOSMIN IV KIT
30.6000 | PACK | Freq: Once | INTRAVENOUS | Status: AC | PRN
  Administered 2019-07-10: 30.6 via INTRAVENOUS

## 2019-07-10 MED ORDER — REGADENOSON 0.4 MG/5ML IV SOLN
INTRAVENOUS | Status: AC
  Filled 2019-07-10: qty 5

## 2019-07-10 MED ORDER — TRAZODONE HCL 50 MG PO TABS
50.0000 mg | ORAL_TABLET | Freq: Every day | ORAL | Status: DC
  Administered 2019-07-10: 50 mg via ORAL
  Filled 2019-07-10: qty 1

## 2019-07-10 NOTE — H&P (Signed)
Referring Physician: Lon Klippel is an 64 y.o. male.                       Chief Complaint: Chest pain  HPI: 64 years old white male with past medical history of CAD, CABG, MI, type 2 DM, Obesity, paroxysmal atrial fibrillation, hypertension and hyperlipidemia had precordial chest pain with exertion, radiating to left arm and associated with shortness of breath and sweating for last 4-5 days now. He had similar episode in 12/2017 and underwent cardiac cath showing patent bypass grafts and LIMA. He denies fever, chills or cough. EKG shows SR with rightward axis. Echocardiogram showed normal LV systolic function but severe LVH.  Past Medical History:  Diagnosis Date  . Atrial fibrillation (HCC)   . BPH (benign prostatic hyperplasia)   . Coronary artery disease   . Coronary artery disease involving coronary bypass graft   . Diabetes mellitus without complication (HCC)   . GERD (gastroesophageal reflux disease)   . Hypercholesterolemia   . Hypertension   . Myocardial infarct [facility])       Past Surgical History:  Procedure Laterality Date  . LEFT HEART CATH AND CORS/GRAFTS ANGIOGRAPHY N/A 12/21/2017   Procedure: LEFT HEART CATH AND CORS/GRAFTS ANGIOGRAPHY;  Surgeon: [place], Peter M, MD;  Location: [facility] INVASIVE CV LAB;  Service: Cardiovascular;  Laterality: N/A;  . LUMBAR FUSION    . MEDIAL COLLATERAL LIGAMENT REPAIR, KNEE    . TIBIAL FASCIOTOMY    . tibiastomy      Family History  Problem Relation Age of Onset  . Heart attack Mother   . Cancer Mother   . Heart attack Maternal Grandmother    Social History:  reports that he has never smoked. He has never used smokeless tobacco. He reports that he does not drink alcohol or use drugs.  Allergies:  Allergies  Allergen Reactions  . Brilinta [Ticagrelor] Shortness Of Breath    Medications Prior to Admission  Medication Sig Dispense Refill  . acetaminophen (TYLENOL) 325 MG tablet Take 2 tablets (650 mg total) by mouth every  4 (four) hours as needed for headache or mild pain.    Marland Kitchen amLODipine (NORVASC) 10 MG tablet Take 10 mg by mouth daily.    Marland Kitchen apixaban (ELIQUIS) 5 MG TABS tablet Take 5 mg by mouth 2 (two) times daily.    Marland Kitchen aspirin EC 81 MG tablet Take 81 mg by mouth daily.    Marland Kitchen atorvastatin (LIPITOR) 80 MG tablet Take 80 mg by mouth at bedtime.     . carvedilol (COREG) 6.25 MG tablet Take 1 tablet (6.25 mg total) by mouth 2 (two) times daily with a meal. 60 tablet 0  . finasteride (PROSCAR) 5 MG tablet Take 5 mg by mouth daily.    . insulin glargine (LANTUS) 100 UNIT/ML injection Inject 30 Units into the skin at bedtime.     . isosorbide mononitrate (IMDUR) 30 MG 24 hr tablet Take 1 tablet (30 mg total) by mouth daily. (Patient taking differently: Take 30 mg by mouth at bedtime. ) 30 tablet 0  . levothyroxine (SYNTHROID) 75 MCG tablet Take 75 mcg by mouth daily before breakfast.    . metFORMIN (GLUCOPHAGE) 1000 MG tablet Take 1,000 mg by mouth daily.    . nitroGLYCERIN (NITROSTAT) 0.4 MG SL tablet Place 0.4 mg under the tongue every 5 (five) minutes as needed for chest pain.     Marland Kitchen omeprazole (PRILOSEC) 20 MG capsule Take 20  mg by mouth every evening.     . ranolazine (RANEXA) 1000 MG SR tablet Take 1 tablet (1,000 mg total) by mouth 2 (two) times daily. 60 tablet 0  . tamsulosin (FLOMAX) 0.4 MG CAPS capsule Take 0.4 mg by mouth daily.      Results for orders placed or performed during the hospital encounter of 07/08/19 (from the past 48 hour(s))  Basic metabolic panel     Status: Abnormal   Collection Time: 07/08/19  6:45 PM  Result Value Ref Range   Sodium 137 135 - 145 mmol/L   Potassium 4.4 3.5 - 5.1 mmol/L   Chloride 102 98 - 111 mmol/L   CO2 23 22 - 32 mmol/L   Glucose, Bld 168 (H) 70 - 99 mg/dL   BUN 21 8 - 23 mg/dL   Creatinine, Ser 1.611.25 (H) 0.61 - 1.24 mg/dL   Calcium 9.0 8.9 - 09.610.3 mg/dL   GFR calc non Af Amer >60 >60 mL/min   GFR calc Af Amer >60 >60 mL/min   Anion gap 12 5 - 15    Comment:  Performed at [facility], [address] [place], [place] [address]  CBC     Status: None   Collection Time: 07/08/19  6:45 PM  Result Value Ref Range   WBC 6.5 4.0 - 10.5 K/uL   RBC 4.62 4.22 - 5.81 MIL/uL   Hemoglobin 14.0 13.0 - 17.0 g/dL   HCT 98.142.7 19.139.0 - 47.852.0 %   MCV 92.4 80.0 - 100.0 fL   MCH 30.3 26.0 - 34.0 pg   MCHC 32.8 30.0 - 36.0 g/dL   RDW 29.513.0 62.111.5 - 30.815.5 %   Platelets 248 150 - 400 K/uL   nRBC 0.0 0.0 - 0.2 %    Comment: Performed at [facility], [address] [place], [place] [address]  Troponin I (High Sensitivity)     Status: None   Collection Time: 07/08/19  6:45 PM  Result Value Ref Range   Troponin I (High Sensitivity) 5 <18 ng/L    Comment: (NOTE) Elevated high sensitivity troponin I (hsTnI) values and significant  changes across serial measurements may suggest ACS but many other  chronic and acute conditions are known to elevate hsTnI results.  Refer to the "Links" section for chest pain algorithms and additional  guidance. Performed at [facility], [address] [place], [place] [address]   Hepatic function panel     Status: Abnormal   Collection Time: 07/08/19  6:45 PM  Result Value Ref Range   Total Protein 6.1 (L) 6.5 - 8.1 g/dL   Albumin 3.8 3.5 - 5.0 g/dL   AST 17 15 - 41 U/L   ALT 20 0 - 44 U/L   Alkaline Phosphatase 47 38 - 126 U/L   Total Bilirubin 0.8 0.3 - 1.2 mg/dL   Bilirubin, Direct 0.1 0.0 - 0.2 mg/dL   Indirect Bilirubin 0.7 0.3 - 0.9 mg/dL    Comment: Performed at [facility], [address] [place], [place] [address]  Respiratory Panel by RT PCR (Flu A&B, Covid) - Nasopharyngeal Swab     Status: None   Collection Time: 07/08/19  7:07 PM   Specimen: Nasopharyngeal Swab  Result Value Ref Range   SARS Coronavirus 2 by RT PCR NEGATIVE NEGATIVE    Comment: (NOTE) SARS-CoV-2 target nucleic acids are NOT DETECTED. The SARS-CoV-2 RNA is generally detectable in upper respiratoy specimens during the acute phase of infection. The  lowest concentration of SARS-CoV-2  viral copies this assay can detect is 131 copies/mL. A negative result does not preclude SARS-Cov-2 infection and should not be used as the sole basis for treatment or other patient management decisions. A negative result may occur with  improper specimen collection/handling, submission of specimen other than nasopharyngeal swab, presence of viral mutation(s) within the areas targeted by this assay, and inadequate number of viral copies (<131 copies/mL). A negative result must be combined with clinical observations, patient history, and epidemiological information. The expected result is Negative. Fact Sheet for Patients:  https://www.moore.com/ Fact Sheet for Healthcare Providers:  https://www.young.biz/ This test is not yet ap proved or cleared by the [place] FDA and  has been authorized for detection and/or diagnosis of SARS-CoV-2 by FDA under an Emergency Use Authorization (EUA). This EUA will remain  in effect (meaning this test can be used) for the duration of the COVID-19 declaration under Section 564(b)(1) of the Act, 21 U.S.C. section 360bbb-3(b)(1), unless the authorization is terminated or revoked sooner.    Influenza A by PCR NEGATIVE NEGATIVE   Influenza B by PCR NEGATIVE NEGATIVE    Comment: (NOTE) The Xpert Xpress SARS-CoV-2/FLU/RSV assay is intended as an aid in  the diagnosis of influenza from Nasopharyngeal swab specimens and  should not be used as a sole basis for treatment. Nasal washings and  aspirates are unacceptable for Xpert Xpress SARS-CoV-2/FLU/RSV  testing. Fact Sheet for Patients: https://www.moore.com/ Fact Sheet for Healthcare Providers: https://www.young.biz/ This test is not yet approved or cleared by the [place] FDA and  has been authorized for detection and/or diagnosis of SARS-CoV-2 by  FDA under an Emergency Use  Authorization (EUA). This EUA will remain  in effect (meaning this test can be used) for the duration of the  Covid-19 declaration under Section 564(b)(1) of the Act, 21  U.S.C. section 360bbb-3(b)(1), unless the authorization is  terminated or revoked. Performed at [facility], [address], [place], [place] [address]   Troponin I (High Sensitivity)     Status: None   Collection Time: 07/08/19  8:41 PM  Result Value Ref Range   Troponin I (High Sensitivity) 5 <18 ng/L    Comment: (NOTE) Elevated high sensitivity troponin I (hsTnI) values and significant  changes across serial measurements may suggest ACS but many other  chronic and acute conditions are known to elevate hsTnI results.  Refer to the "Links" section for chest pain algorithms and additional  guidance. Performed at [facility], [address] [place], [place] [address]   Glucose, capillary     Status: Abnormal   Collection Time: 07/09/19 12:07 AM  Result Value Ref Range   Glucose-Capillary 126 (H) 70 - 99 mg/dL   Comment 1 Notify RN    Comment 2 Document in Chart   HIV Antibody (routine testing w rflx)     Status: None   Collection Time: 07/09/19  5:06 AM  Result Value Ref Range   HIV Screen 4th Generation wRfx NON REACTIVE NON REACTIVE    Comment: Performed at [facility], [address], [place], [place] [address]  Hemoglobin A1c     Status: Abnormal   Collection Time: 07/09/19  5:07 AM  Result Value Ref Range   Hgb A1c MFr Bld 7.7 (H) 4.8 - 5.6 %    Comment: (NOTE) Pre diabetes:          5.7%-6.4% Diabetes:              >6.4% Glycemic control for   <  7.0% adults with diabetes    Mean Plasma Glucose 174.29 mg/dL    Comment: Performed at [facility] [address] [place], [place] [address]  Glucose, capillary     Status: Abnormal   Collection Time: 07/09/19  7:45 AM  Result Value Ref Range   Glucose-Capillary 180 (H) 70 - 99 mg/dL  Glucose, capillary     Status: Abnormal   Collection  Time: 07/09/19 12:00 PM  Result Value Ref Range   Glucose-Capillary 278 (H) 70 - 99 mg/dL  Glucose, capillary     Status: Abnormal   Collection Time: 07/09/19  4:41 PM  Result Value Ref Range   Glucose-Capillary 158 (H) 70 - 99 mg/dL  Glucose, capillary     Status: Abnormal   Collection Time: 07/09/19  8:08 PM  Result Value Ref Range   Glucose-Capillary 194 (H) 70 - 99 mg/dL  Glucose, capillary     Status: Abnormal   Collection Time: 07/09/19 10:20 PM  Result Value Ref Range   Glucose-Capillary 175 (H) 70 - 99 mg/dL   DG Chest Portable 1 View  Result Date: 07/08/2019 CLINICAL DATA:  Chest pain. EXAM: PORTABLE CHEST 1 VIEW COMPARISON:  April 01, 2019 FINDINGS: Multiple sternal wires and vascular clips are seen. A very mild amount of atelectasis and/or infiltrate is seen within the bilateral lung bases. There is no evidence of a pleural effusion or pneumothorax. The heart size and mediastinal contours are within normal limits. A radiopaque fusion plate and screws are seen overlying the cervical spine. Chronic fifth and 6 right rib fractures are seen. Multilevel degenerative changes seen throughout the thoracic spine. IMPRESSION: 1. Evidence of prior median sternotomy/CABG. 2. Very mild bibasilar atelectasis and/or infiltrate. 3. Postoperative changes within the cervical spine. Electronically Signed   By: Virgina Norfolk M.D.   On: 07/08/2019 19:22   ECHOCARDIOGRAM COMPLETE  Result Date: 07/09/2019   ECHOCARDIOGRAM REPORT   Patient Name:   Kevin Ballard Date of Exam: 07/09/2019 Medical Rec #:  [id number]  Height:       69.0 in Accession #:    [id number] Weight:       249.8 lb Date of Birth:  September 17, 1955  BSA:          2.27 m Patient Age:    51 years   BP:           127/58 mmHg Patient Gender: M          HR:           69 bpm. Exam Location:  [facility] Procedure: 2D Echo, Cardiac Doppler and Color Doppler Indications:    Chest Pain 786.50 / R07.9  History:        Patient has no prior history of  Echocardiogram examinations.                 CAD, Prior CABG; Risk Factors:Hypertension, Diabetes and                 Dyslipidemia. Paroxysmal atrial fibrillation, Obesity.  Sonographer:    Alvino Chapel RCS Referring Phys: [id number] [place]  1. Left ventricular ejection fraction, by visual estimation, is 60 to 65%. The left ventricle has normal function. There is severely increased left ventricular hypertrophy.  2. Elevated left atrial pressure.  3. Left ventricular diastolic parameters are consistent with Grade II diastolic dysfunction (pseudonormalization).  4. The left ventricle has no regional wall motion abnormalities.  5. Global right ventricle has normal systolic  function.The right ventricular size is normal.  6. Left atrial size was normal.  7. Right atrial size was normal.  8. Mild mitral annular calcification.  9. The mitral valve is normal in structure. No evidence of mitral valve regurgitation. No evidence of mitral stenosis. 10. The tricuspid valve is normal in structure. Tricuspid valve regurgitation is trivial. 11. The aortic valve is tricuspid. Aortic valve regurgitation is not visualized. Mild aortic valve sclerosis without stenosis. 12. The pulmonic valve was normal in structure. Pulmonic valve regurgitation is not visualized. 13. The inferior vena cava is normal in size with greater than 50% respiratory variability, suggesting right atrial pressure of 3 mmHg. 14. Normal LV systolic function; severe LVH; grade 2 diastolic dysfunction. FINDINGS  Left Ventricle: Left ventricular ejection fraction, by visual estimation, is 60 to 65%. The left ventricle has normal function. The left ventricle has no regional wall motion abnormalities. There is severely increased left ventricular hypertrophy. Left ventricular diastolic parameters are consistent with Grade II diastolic dysfunction (pseudonormalization). Elevated left atrial pressure. Right Ventricle: The right ventricular size is  normal.Global RV systolic function is has normal systolic function. Left Atrium: Left atrial size was normal in size. Right Atrium: Right atrial size was normal in size Pericardium: There is no evidence of pericardial effusion. Mitral Valve: The mitral valve is normal in structure. There is mild calcification of the mitral valve leaflet(s). Mild mitral annular calcification. No evidence of mitral valve regurgitation. No evidence of mitral valve stenosis by observation. Tricuspid Valve: The tricuspid valve is normal in structure. Tricuspid valve regurgitation is trivial. Aortic Valve: The aortic valve is tricuspid. Aortic valve regurgitation is not visualized. Mild aortic valve sclerosis is present, with no evidence of aortic valve stenosis. Pulmonic Valve: The pulmonic valve was normal in structure. Pulmonic valve regurgitation is not visualized. Pulmonic regurgitation is not visualized. Aorta: The aortic root is normal in size and structure. Venous: The inferior vena cava is normal in size with greater than 50% respiratory variability, suggesting right atrial pressure of 3 mmHg. IAS/Shunts: No atrial level shunt detected by color flow Doppler. Additional Comments: Normal LV systolic function; severe LVH; grade 2 diastolic dysfunction.  LEFT VENTRICLE PLAX 2D LVIDd:         4.67 cm       Diastology LVIDs:         2.92 cm       LV e' lateral:   9.68 cm/s LV PW:         1.28 cm       LV E/e' lateral: 11.6 LV IVS:        1.42 cm       LV e' medial:    6.64 cm/s LVOT diam:     1.60 cm       LV E/e' medial:  16.9 LV SV:         68 ml LV SV Index:   28.43 LVOT Area:     2.01 cm  LV Volumes (MOD) LV area d, A2C:    22.00 cm LV area d, A4C:    30.00 cm LV area s, A2C:    13.00 cm LV area s, A4C:    12.80 cm LV major d, A2C:   7.70 cm LV major d, A4C:   8.14 cm LV major s, A2C:   6.50 cm LV major s, A4C:   6.31 cm LV vol d, MOD A2C: 52.7 ml LV vol d, MOD A4C: 90.9 ml LV vol s, MOD A2C:  22.8 ml LV vol s, MOD A4C: 22.1 ml  LV SV MOD A2C:     29.9 ml LV SV MOD A4C:     90.9 ml LV SV MOD BP:      48.4 ml RIGHT VENTRICLE RV S prime:     12.30 cm/s TAPSE (M-mode): 2.1 cm LEFT ATRIUM             Index       RIGHT ATRIUM           Index LA diam:        4.70 cm 2.07 cm/m  RA Area:     16.20 cm LA Vol (A2C):   54.2 ml 23.87 ml/m RA Volume:   37.60 ml  16.56 ml/m LA Vol (A4C):   47.1 ml 20.74 ml/m LA Biplane Vol: 53.2 ml 23.43 ml/m  AORTIC VALVE LVOT Vmax:   111.00 cm/s LVOT Vmean:  72.100 cm/s LVOT VTI:    0.240 m  AORTA Ao Root diam: 3.40 cm MITRAL VALVE MV Area (PHT): 3.85 cm              SHUNTS MV PHT:        57.13 msec            Systemic VTI:  0.24 m MV Decel Time: 197 msec              Systemic Diam: 1.60 cm MV E velocity: 112.00 cm/s 103 cm/s MV A velocity: 70.30 cm/s  70.3 cm/s MV E/A ratio:  1.59        1.5  Olga Millers MD Electronically signed by Olga Millers MD Signature Date/Time: 07/09/2019/12:43:58 PM    Final     Review Of Systems Constitutional: No fever, chills, positive weight gain. Eyes: No vision change, wears glasses. No discharge or pain. Ears: No hearing loss, No tinnitus. Respiratory: No asthma, COPD, pneumonias. Positive shortness of breath. No hemoptysis. Cardiovascular: Positive chest pain, palpitation, leg edema. Gastrointestinal: No nausea, vomiting, diarrhea, constipation. No GI bleed. No hepatitis. Genitourinary: No dysuria, hematuria, kidney stone. No incontinance. Neurological: No headache, stroke, seizures.  Psychiatry: No psych facility admission for anxiety, depression, suicide. No detox. Skin: No rash. Musculoskeletal: Positive joint pain, no fibromyalgia. Positive neck pain, back pain. Lymphadenopathy: No lymphadenopathy. Hematology: No anemia or easy bruising.   Blood pressure (!) 129/58, pulse 69, temperature 98.2 F (36.8 C), temperature source Oral, resp. rate 18, height 5\' 9"  (1.753 m), weight 113.3 kg, SpO2 98 %. Body mass index is 36.89 kg/m. General appearance:  alert, cooperative, appears stated age and no distress Head: Normocephalic, atraumatic. Eyes: Blue eyes, pink conjunctiva, corneas clear. PERRL, EOM's intact. Neck: No adenopathy, no carotid bruit, no JVD, supple, symmetrical, trachea midline and thyroid not enlarged. Resp: Clear to auscultation bilaterally. Cardio: Regular rate and rhythm, S1, S2 normal, III/VI systolic murmur, no click, rub or gallop GI: Soft, non-tender; bowel sounds normal; no organomegaly. Extremities: Trace edema, no cyanosis or clubbing. Skin: Warm and dry.  Neurologic: Alert and oriented X 3, normal strength. Normal coordination.  Assessment/Plan Acute coronary syndrome CAD CABG HTN Type 2 DM Obesity Hyperlipidemia  NM Myocardial perfusion stress test in AM. Continue home medication. Trazodone for anxiety/sleep.  Time spent: Review of old records, Lab, x-rays, EKG, other cardiac tests, examination, discussion with patient over 70 minutes.  , MD  07/10/2019, 12:35 AM

## 2019-07-10 NOTE — Progress Notes (Signed)
Ref: Patient, No Pcp Per   Subjective:  Underwent nuclear stress test. No significant reversible ischemia. VS stable.  Objective:  Vital Signs in the last 24 hours: Temp:  [97.8 F (36.6 C)-98.2 F (36.8 C)] 98.1 F (36.7 C) (01/24 1404) Pulse Rate:  [69-77] 75 (01/24 1404) Cardiac Rhythm: Normal sinus rhythm (01/24 1902) Resp:  [18-19] 19 (01/24 0544) BP: (115-141)/(41-66) 115/57 (01/24 1404) SpO2:  [96 %-98 %] 97 % (01/24 1404) Weight:  [112.6 kg] 112.6 kg (01/24 0544)  Physical Exam: BP Readings from Last 1 Encounters:  07/10/19 (!) 115/57     Wt Readings from Last 1 Encounters:  07/10/19 112.6 kg    Weight change: -0.816 kg Body mass index is 36.65 kg/m. HEENT: [place]/AT, Eyes-Blue, Conjunctiva-Pink, Sclera-Non-icteric Neck: No JVD, No bruit, Trachea midline. Lungs:  Clear, Bilateral. Cardiac:  Regular rhythm, normal S1 and S2, no S3. II/VI systolic murmur. Abdomen:  Soft, non-tender. BS present. Extremities:  No edema present. No cyanosis. No clubbing. CNS: AxOx3, Cranial nerves grossly intact, moves all 4 extremities.  Skin: Warm and dry.   Intake/Output from previous day: 01/23 0701 - 01/24 0700 In: -  Out: 700 [Urine:700]    Lab Results: BMET    Component Value Date/Time   NA 137 07/08/2019 1845   NA 132 (L) 04/01/2019 1314   NA 135 03/28/2019 0330   K 4.4 07/08/2019 1845   K 4.2 04/01/2019 1314   K 4.0 03/28/2019 0330   CL 102 07/08/2019 1845   CL 100 04/01/2019 1314   CL 100 03/28/2019 0330   CO2 23 07/08/2019 1845   CO2 22 04/01/2019 1314   CO2 25 03/28/2019 0330   GLUCOSE 168 (H) 07/08/2019 1845   GLUCOSE 245 (H) 04/01/2019 1314   GLUCOSE 259 (H) 03/28/2019 0330   BUN 21 07/08/2019 1845   BUN 21 04/01/2019 1314   BUN 34 (H) 03/28/2019 0330   CREATININE 1.25 (H) 07/08/2019 1845   CREATININE 1.19 04/01/2019 1314   CREATININE 1.02 03/28/2019 0330   CALCIUM 9.0 07/08/2019 1845   CALCIUM 8.3 (L) 04/01/2019 1314   CALCIUM 8.4 (L) 03/28/2019  0330   GFRNONAA >60 07/08/2019 1845   GFRNONAA >60 04/01/2019 1314   GFRNONAA >60 03/28/2019 0330   GFRAA >60 07/08/2019 1845   GFRAA >60 04/01/2019 1314   GFRAA >60 03/28/2019 0330   CBC    Component Value Date/Time   WBC 6.5 07/08/2019 1845   RBC 4.62 07/08/2019 1845   HGB 14.0 07/08/2019 1845   HCT 42.7 07/08/2019 1845   PLT 248 07/08/2019 1845   MCV 92.4 07/08/2019 1845   MCH 30.3 07/08/2019 1845   MCHC 32.8 07/08/2019 1845   RDW 13.0 07/08/2019 1845   LYMPHSABS 1.5 03/27/2019 0105   MONOABS 1.0 03/27/2019 0105   EOSABS 0.0 03/27/2019 0105   BASOSABS 0.1 03/27/2019 0105   HEPATIC Function Panel Recent Labs    03/28/19 0330 04/01/19 1314 07/08/19 1845  PROT 5.5* 6.6 6.1*   HEMOGLOBIN A1C No components found for: HGA1C,  MPG CARDIAC ENZYMES Lab Results  Component Value Date   TROPONINI <0.03 12/20/2017   TROPONINI <0.03 12/19/2017   TROPONINI <0.03 12/19/2017   BNP No results for input(s): PROBNP in the last 8760 hours. TSH No results for input(s): TSH in the last 8760 hours. CHOLESTEROL No results for input(s): CHOL in the last 8760 hours.  Scheduled Meds: . amLODipine  10 mg Oral Daily  . apixaban  5 mg Oral BID  .  aspirin EC  81 mg Oral Daily  . atorvastatin  80 mg Oral QHS  . carvedilol  6.25 mg Oral BID WC  . finasteride  5 mg Oral Daily  . insulin aspart  0-15 Units Subcutaneous TID WC  . insulin aspart  0-5 Units Subcutaneous QHS  . insulin glargine  40 Units Subcutaneous QHS  . [START ON 07/11/2019] isosorbide mononitrate  120 mg Oral Daily  . levothyroxine  75 mcg Oral QAC breakfast  . pantoprazole  40 mg Oral QHS  . ranolazine  1,000 mg Oral BID  . regadenoson      . tamsulosin  0.4 mg Oral Daily   Continuous Infusions: PRN Meds:.acetaminophen **OR** acetaminophen, fentaNYL (SUBLIMAZE) injection, nitroGLYCERIN, zolpidem  Assessment/Plan: Unstable angina CAD CABG HTN Type 2 DM Obesity Hyperlipidemia  Discussed diet, medications  and activity. Increase isosorbide mononitrate to 120 mg. daily from 60 mg. Daily. Increase activity. Patient has primary care and cardiologist and may follow with them as needed.   LOS: 0 days   Time spent including chart review, lab review, examination, discussion with patient : 30 min   Orpah Cobb  MD  07/10/2019, 8:36 PM

## 2019-07-11 LAB — GLUCOSE, CAPILLARY
Glucose-Capillary: 186 mg/dL — ABNORMAL HIGH (ref 70–99)
Glucose-Capillary: 148 mg/dL — ABNORMAL HIGH (ref 70–99)

## 2019-07-11 MED ORDER — ISOSORBIDE MONONITRATE ER 120 MG PO TB24: 120.0000 mg | ORAL_TABLET | Freq: Every day | ORAL | 6 refills | Status: DC

## 2019-07-11 MED ORDER — TRAZODONE HCL 50 MG PO TABS: 50.0000 mg | ORAL_TABLET | Freq: Every day | ORAL | 6 refills | Status: DC

## 2019-07-11 MED ORDER — INSULIN ASPART 100 UNIT/ML ~~LOC~~ SOLN: 0.0000 [IU] | Freq: Three times a day (TID) | SUBCUTANEOUS | 11 refills | Status: DC

## 2019-07-11 NOTE — Care Management (Signed)
07-11-19 1501 Case Manager called the [facility] Department at [phone] for the jail and patient can return to the same location. Case Manager will fax the dc summary to [phone]. No further needs from Case Manager at this time. Gala Lewandowsky, RN,BSN Case Manager [phone]

## 2019-07-11 NOTE — Progress Notes (Signed)
Patient ambulated in hall, c/o feeling dizzy and SOB initially.  HR 85 to 100 bpm with ambulation and oxygen sats 90-95 on room air,.  BP post ambulation 148/61.

## 2019-07-11 NOTE — Progress Notes (Signed)
Pt ambulated up and down hall for 8 minutes. HR remained normal until end of walk when HR jumped to 150 & dropped to 54 but returned to normal immediately. O2 sats dropped to 88% at end of walk. Pt took some deep breaths & O2 returned to normal.

## 2019-07-11 NOTE — Discharge Summary (Signed)
Physician Discharge Summary  Patient ID: Kevin Ballard MRN: [id number] DOB/AGE: 12-29-1955 63 y.o.  Admit date: 07/08/2019 Discharge date: 07/11/2019  Admission Diagnoses: Acute coronary syndrome CAD CABG Hypertension Type 2 DM Obesity Hyperlipidemia  Discharge Diagnoses:  Principal Problem:   Chest pain Active Problems:   CAD (coronary artery disease) of bypass graft   Type 2 diabetes mellitus with other specified complication (HCC)   Paroxysmal atrial fibrillation (HCC)   HTN (hypertension)   HLD (hyperlipidemia)   BPH (benign prostatic hyperplasia)   GERD (gastroesophageal reflux disease)   Anxiety   Insomnia  Discharged Condition: fair  Hospital Course: 64 years old male with PMH of CAD, CABG, MI, type 2 DM, Obesity, paroxysmal atrial fibrillation, hypertension and hyperlipidemia has recurrent precordial chest pain. Chest pain is more with exertion and radiates to left arm. His EKG showed sinus rhythm with rightward axis. Echocardiogram showed normal LV systolic function. NM myocardial perfusion stress test showed no significant reversible ischemia.  His isosorbide dose was increased to 60 mg. and then 120 mg. Daily. Trazodone was given for anxiety and Insomnia.Marland Kitchen He was advised to follow heart healthy diet and activity. He was also advised to use cane as needed for dizziness and recuce isosorbide dose to 60 mg. If needed. Patient may return to current correctional facility [facility]. He will see me in 1 week and then he will see facility doctor and his old Cardiologist as needed.  Consults: cardiology  Significant Diagnostic Studies: labs: CBC was normal. Electrolytes and BUN was normal. Creatinine was borderline at 1.25 and glucose was 168 mg/dL. Troponin I was normal. Hgb A1C was 7.7 %.  CXR-No acute change.  EKG: SR with rightward axis.  Echocardiogram: Moderate to severe LVH with moderate diastolic dysfunction.  Treatments:  cardiac meds: carvedilol, amlodipine, Imdur and ranolazine and atorvastatin.  Discharge Exam: Blood pressure 118/60, pulse 82, temperature 98 F (36.7 C), temperature source Oral, resp. rate 18, height 5\' 9"  (1.753 m), weight 112.7 kg, SpO2 95 %. General appearance: alert, cooperative and appears stated age. Head: Normocephalic, atraumatic. Eyes: Blue eyes, pink conjunctiva, corneas clear. PERRL, EOM's intact.  Neck: No adenopathy, no carotid bruit, no JVD, supple, symmetrical, trachea midline and thyroid not enlarged. Resp: Clear to auscultation bilaterally. Cardio: Regular rate and rhythm, S1, S2 normal, II/VI systolic murmur, no click, rub or gallop. GI: Soft, non-tender; bowel sounds normal; no organomegaly. Extremities: No edema, cyanosis or clubbing. Skin: Warm and dry.  Neurologic: Alert and oriented X 3, normal strength and tone. Normal coordination and gait.  Disposition: Discharge disposition: 01-Home or Self Care        Allergies as of 07/11/2019      Reactions   Brilinta [ticagrelor] Shortness Of Breath      Medication List    TAKE these medications   acetaminophen 325 MG tablet Commonly known as: TYLENOL Take 2 tablets (650 mg total) by mouth every 4 (four) hours as needed for headache or mild pain.   amLODipine 10 MG tablet Commonly known as: NORVASC Take 10 mg by mouth daily.   apixaban 5 MG Tabs tablet Commonly known as: ELIQUIS Take 5 mg by mouth 2 (two) times daily.   aspirin EC 81 MG tablet Take 81 mg by mouth daily.   atorvastatin 80 MG tablet Commonly known as: LIPITOR Take 80 mg by mouth at bedtime.   carvedilol 6.25 MG tablet Commonly known as: COREG Take 1 tablet (6.25 mg total) by mouth 2 (two)  times daily with a meal.   finasteride 5 MG tablet Commonly known as: PROSCAR Take 5 mg by mouth daily.   insulin aspart 100 UNIT/ML injection Commonly known as: novoLOG Inject 0-15 Units into the skin 3 (three) times daily with meals.    insulin glargine 100 UNIT/ML injection Commonly known as: LANTUS Inject 30 Units into the skin at bedtime.   isosorbide mononitrate 120 MG 24 hr tablet Commonly known as: IMDUR Take 1 tablet (120 mg total) by mouth daily. Start taking on: July 12, 2019 What changed:   medication strength  how much to take   levothyroxine 75 MCG tablet Commonly known as: SYNTHROID Take 75 mcg by mouth daily before breakfast.   metFORMIN 1000 MG tablet Commonly known as: GLUCOPHAGE Take 1,000 mg by mouth daily.   nitroGLYCERIN 0.4 MG SL tablet Commonly known as: NITROSTAT Place 0.4 mg under the tongue every 5 (five) minutes as needed for chest pain.   omeprazole 20 MG capsule Commonly known as: PRILOSEC Take 20 mg by mouth every evening.   ranolazine 1000 MG SR tablet Commonly known as: RANEXA Take 1 tablet (1,000 mg total) by mouth 2 (two) times daily.   tamsulosin 0.4 MG Caps capsule Commonly known as: FLOMAX Take 0.4 mg by mouth daily.   traZODone 50 MG tablet Commonly known as: DESYREL Take 1 tablet (50 mg total) by mouth at bedtime.      Follow-up Information    Orpah Cobb, MD Follow up in 1 week(s).   Specialty: Cardiology Contact information: [address] [place] [address] [phone]           Time spent: Review of old chart, current chart, lab, x-ray, cardiac tests and discussion with patient over 60 minutes.  Signed: Ricki Rodriguez 07/11/2019, 2:08 PM

## 2019-12-20 ENCOUNTER — Other Ambulatory Visit: Payer: Self-pay

## 2019-12-20 ENCOUNTER — Encounter (HOSPITAL_COMMUNITY): Payer: Self-pay | Admitting: Emergency Medicine

## 2019-12-20 ENCOUNTER — Observation Stay (HOSPITAL_COMMUNITY)
Admission: EM | Admit: 2019-12-20 | Discharge: 2019-12-21 | Disposition: A | Discharge status: Home / Self Care | Attending: Family Medicine | Admitting: Family Medicine

## 2019-12-20 ENCOUNTER — Emergency Department (HOSPITAL_COMMUNITY)

## 2019-12-20 DIAGNOSIS — I25810 Atherosclerosis of coronary artery bypass graft(s) without angina pectoris: Secondary | ICD-10-CM | POA: Diagnosis present

## 2019-12-20 DIAGNOSIS — Z7901 Long term (current) use of anticoagulants: Secondary | ICD-10-CM | POA: Diagnosis not present

## 2019-12-20 DIAGNOSIS — E039 Hypothyroidism, unspecified: Secondary | ICD-10-CM | POA: Insufficient documentation

## 2019-12-20 DIAGNOSIS — E1169 Type 2 diabetes mellitus with other specified complication: Secondary | ICD-10-CM

## 2019-12-20 DIAGNOSIS — E119 Type 2 diabetes mellitus without complications: Secondary | ICD-10-CM | POA: Diagnosis not present

## 2019-12-20 DIAGNOSIS — Z79899 Other long term (current) drug therapy: Secondary | ICD-10-CM | POA: Diagnosis not present

## 2019-12-20 DIAGNOSIS — I2510 Atherosclerotic heart disease of native coronary artery without angina pectoris: Secondary | ICD-10-CM | POA: Insufficient documentation

## 2019-12-20 DIAGNOSIS — Z8616 Personal history of COVID-19: Secondary | ICD-10-CM | POA: Insufficient documentation

## 2019-12-20 DIAGNOSIS — Z7982 Long term (current) use of aspirin: Secondary | ICD-10-CM | POA: Insufficient documentation

## 2019-12-20 DIAGNOSIS — I119 Hypertensive heart disease without heart failure: Secondary | ICD-10-CM | POA: Insufficient documentation

## 2019-12-20 DIAGNOSIS — Z951 Presence of aortocoronary bypass graft: Secondary | ICD-10-CM | POA: Insufficient documentation

## 2019-12-20 DIAGNOSIS — Z20822 Contact with and (suspected) exposure to covid-19: Secondary | ICD-10-CM | POA: Insufficient documentation

## 2019-12-20 DIAGNOSIS — R079 Chest pain, unspecified: Secondary | ICD-10-CM | POA: Diagnosis not present

## 2019-12-20 DIAGNOSIS — R0609 Other forms of dyspnea: Principal | ICD-10-CM | POA: Insufficient documentation

## 2019-12-20 DIAGNOSIS — R42 Dizziness and giddiness: Secondary | ICD-10-CM | POA: Insufficient documentation

## 2019-12-20 DIAGNOSIS — E785 Hyperlipidemia, unspecified: Secondary | ICD-10-CM | POA: Insufficient documentation

## 2019-12-20 DIAGNOSIS — I480 Paroxysmal atrial fibrillation: Secondary | ICD-10-CM | POA: Diagnosis not present

## 2019-12-20 DIAGNOSIS — I25700 Atherosclerosis of coronary artery bypass graft(s), unspecified, with unstable angina pectoris: Secondary | ICD-10-CM | POA: Diagnosis not present

## 2019-12-20 DIAGNOSIS — R0789 Other chest pain: Secondary | ICD-10-CM | POA: Diagnosis not present

## 2019-12-20 DIAGNOSIS — N400 Enlarged prostate without lower urinary tract symptoms: Secondary | ICD-10-CM | POA: Diagnosis not present

## 2019-12-20 DIAGNOSIS — M542 Cervicalgia: Secondary | ICD-10-CM | POA: Insufficient documentation

## 2019-12-20 DIAGNOSIS — K219 Gastro-esophageal reflux disease without esophagitis: Secondary | ICD-10-CM | POA: Diagnosis not present

## 2019-12-20 DIAGNOSIS — I10 Essential (primary) hypertension: Secondary | ICD-10-CM | POA: Diagnosis present

## 2019-12-20 DIAGNOSIS — Z794 Long term (current) use of insulin: Secondary | ICD-10-CM | POA: Diagnosis not present

## 2019-12-20 DIAGNOSIS — R519 Headache, unspecified: Secondary | ICD-10-CM | POA: Insufficient documentation

## 2019-12-20 LAB — CBC WITH DIFFERENTIAL/PLATELET
Abs Immature Granulocytes: 0.02 10*3/uL (ref 0.00–0.07)
Basophils Absolute: 0 10*3/uL (ref 0.0–0.1)
Basophils Relative: 0 %
Eosinophils Absolute: 0 10*3/uL (ref 0.0–0.5)
Eosinophils Relative: 0 %
HCT: 42.6 % (ref 39.0–52.0)
Hemoglobin: 14.1 g/dL (ref 13.0–17.0)
Immature Granulocytes: 0 %
Lymphocytes Relative: 24 %
Lymphs Abs: 1.6 10*3/uL (ref 0.7–4.0)
MCH: 31 pg (ref 26.0–34.0)
MCHC: 33.1 g/dL (ref 30.0–36.0)
MCV: 93.6 fL (ref 80.0–100.0)
Monocytes Absolute: 0.6 10*3/uL (ref 0.1–1.0)
Monocytes Relative: 10 %
Neutro Abs: 4.5 10*3/uL (ref 1.7–7.7)
Neutrophils Relative %: 66 %
Platelets: 244 10*3/uL (ref 150–400)
RBC: 4.55 MIL/uL (ref 4.22–5.81)
RDW: 13.8 % (ref 11.5–15.5)
WBC: 6.8 10*3/uL (ref 4.0–10.5)
nRBC: 0 % (ref 0.0–0.2)

## 2019-12-20 LAB — BASIC METABOLIC PANEL
Anion gap: 12 (ref 5–15)
BUN: 20 mg/dL (ref 8–23)
CO2: 20 mmol/L — ABNORMAL LOW (ref 22–32)
Calcium: 9.1 mg/dL (ref 8.9–10.3)
Chloride: 104 mmol/L (ref 98–111)
Creatinine, Ser: 1.29 mg/dL — ABNORMAL HIGH (ref 0.61–1.24)
GFR calc Af Amer: >60 mL/min (ref >60)
GFR calc non Af Amer: 58 mL/min — ABNORMAL LOW (ref >60)
Glucose, Bld: 85 mg/dL (ref 70–99)
Potassium: 4.3 mmol/L (ref 3.5–5.1)
Sodium: 136 mmol/L (ref 135–145)

## 2019-12-20 LAB — TROPONIN I (HIGH SENSITIVITY)
Troponin I (High Sensitivity): 8 ng/L (ref <18)
Troponin I (High Sensitivity): 8 ng/L (ref <18)

## 2019-12-20 MED ORDER — IOHEXOL 350 MG/ML SOLN
100.0000 mL | Freq: Once | INTRAVENOUS | Status: AC | PRN
  Administered 2019-12-20: 100 mL via INTRAVENOUS

## 2019-12-20 NOTE — H&P (Signed)
History and Physical  Kevin Ballard RUE:[id number] DOB: [birth year] DOA: 12/20/2019  Referring physician: Meridee Score, MD  PCP: Patient, No Pcp Per  Patient coming from: [facility] facility  Chief Complaint: Shortness of breath  HPI: Kevin Ballard is a 64 y.o. male with medical history significant for paroxysmal atrial fibrillation, BPH, CAD/history of MI/CABG, type 2 diabetes, GERD, hyperlipidemia and hypertension who presents to the emergency department due to complaint of several weeks of shortness of breath on exertion associated with chest pain, dizziness and fatigue.  He states that he recently had a sharp stabbing pain to his head which lasted only for a few seconds several weeks ago and his symptoms including being unsteady when walking has since worsened. Patient states that he has never felt right since he had Covid last fall.  He has an appointment with a neurologist at [facility] on Thursday (7/8) due to gait imbalance and neck pain, but decided to go to the ED due to persistent nonreproducible intermittent chest pain that usually resolve with 1 sublingual nitroglycerin.  ED Course:  In the ED, BP was 160/68 on arrival, other vital signs were within normal range.  Work-up in the ED showed normal CBC and BMP except for creatinine at 1.29, this was (1.0-1.19 about 8 months ago).  CT head without contrast showed no acute medical abnormality.  CT angiography of chest ruled out pulmonary embolus.  Chest x-ray showed no acute cardiopulmonary process.  Review of Systems: Constitutional: Negative for chills and fever.  HENT: Negative for ear pain and sore throat.   Eyes: Negative for pain and visual disturbance.  Respiratory: Positive for shortness of breath.  Negative for cough and chest tightness  Cardiovascular: Positive for chest pain.  Negative for palpitations.  Gastrointestinal: Negative for abdominal pain and vomiting.  Endocrine: Negative for polyphagia and polyuria.  Genitourinary:  Negative for decreased urine volume, dysuria, enuresis, hematuria, vaginal discharge and vaginal pain.  Musculoskeletal: Positive for neck pain.  Negative for arthralgias Skin: Negative for color change and rash.  Allergic/Immunologic: Negative for immunocompromised state.  Neurological: Positive for lightheadedness, weakness and headache.  Negative for tremors, syncope, speech difficulty Hematological: Does not bruise/bleed easily.  All other systems reviewed and are negative   Past Medical History:  Diagnosis Date  . Atrial fibrillation (HCC)   . BPH (benign prostatic hyperplasia)   . Coronary artery disease   . Coronary artery disease involving coronary bypass graft   . Diabetes mellitus without complication (HCC)   . GERD (gastroesophageal reflux disease)   . Hypercholesterolemia   . Hypertension   . Myocardial infarct [facility])    Past Surgical History:  Procedure Laterality Date  . LEFT HEART CATH AND CORS/GRAFTS ANGIOGRAPHY N/A 12/21/2017   Procedure: LEFT HEART CATH AND CORS/GRAFTS ANGIOGRAPHY;  Surgeon: [place], Peter M, MD;  Location: [facility] INVASIVE CV LAB;  Service: Cardiovascular;  Laterality: N/A;  . LUMBAR FUSION    . MEDIAL COLLATERAL LIGAMENT REPAIR, KNEE    . TIBIAL FASCIOTOMY    . tibiastomy      Social History:  reports that he has never smoked. He has never used smokeless tobacco. He reports that he does not drink alcohol and does not use drugs.   Allergies  Allergen Reactions  . Brilinta [Ticagrelor] Shortness Of Breath    Family History  Problem Relation Age of Onset  . Heart attack Mother   . Cancer Mother   . Heart attack Maternal Grandmother      Prior to Admission medications  Medication Sig Start Date End Date Taking? Authorizing Provider  acetaminophen (TYLENOL) 325 MG tablet Take 2 tablets (650 mg total) by mouth every 4 (four) hours as needed for headache or mild pain. 12/22/17  Yes Lonia BloodMcClung, Jeffrey T, MD  amLODipine (NORVASC) 10 MG tablet Take 10  mg by mouth daily.   Yes [provider]  apixaban (ELIQUIS) 5 MG TABS tablet Take 5 mg by mouth 2 (two) times daily.   Yes [provider]  aspirin EC 81 MG tablet Take 81 mg by mouth daily.   Yes [provider]  atorvastatin (LIPITOR) 80 MG tablet Take 80 mg by mouth at bedtime.    Yes [provider]  carvedilol (COREG) 3.125 MG tablet Take 3.125 mg by mouth 2 (two) times daily with a meal.   Yes [provider]  DULoxetine (CYMBALTA) 20 MG capsule Take 40 mg by mouth at bedtime.   Yes [provider]  finasteride (PROSCAR) 5 MG tablet Take 5 mg by mouth daily.   Yes [provider]  Insulin Glargine-Lixisenatide (SOLIQUA) 100-33 UNT-MCG/ML SOPN Inject 40 Units into the skin at bedtime.   Yes [provider]  isosorbide mononitrate (IMDUR) 60 MG 24 hr tablet Take 60 mg by mouth daily.   Yes [provider]  levothyroxine (SYNTHROID) 75 MCG tablet Take 75 mcg by mouth daily before breakfast.   Yes [provider]  metFORMIN (GLUCOPHAGE) 1000 MG tablet Take 1,000 mg by mouth daily.   Yes [provider]  nitroGLYCERIN (NITROSTAT) 0.4 MG SL tablet Place 0.4 mg under the tongue every 5 (five) minutes as needed for chest pain.    Yes [provider]  omeprazole (PRILOSEC) 20 MG capsule Take 20 mg by mouth every evening.    Yes [provider]  oxybutynin (DITROPAN XL) 15 MG 24 hr tablet Take 15 mg by mouth at bedtime.   Yes [provider]  ranolazine (RANEXA) 1000 MG SR tablet Take 1 tablet (1,000 mg total) by mouth 2 (two) times daily. 12/22/17  Yes Lonia BloodMcClung, Jeffrey T, MD  tamsulosin (FLOMAX) 0.4 MG CAPS capsule Take 0.4 mg by mouth daily.   Yes [provider]  terbinafine (LAMISIL) 1 % cream Apply 1 application topically 3 (three) times a week.   Yes [provider]  terbinafine (LAMISIL) 250 MG tablet Take 250 mg by mouth daily.   Yes [provider]  traMADol (ULTRAM) 50 MG tablet Take 50 mg by mouth at bedtime.   Yes [provider]  insulin aspart (NOVOLOG) 100 UNIT/ML injection Inject 0-15 Units into the skin 3 (three) times daily with meals. Patient not taking: Reported on 12/20/2019 07/11/19   Orpah CobbKadakia, Ajay, MD    Physical Exam: BP (!) 157/71 (BP Location: Left Arm)   Pulse 84   Temp 98.2 F (36.8 C) (Oral)   Resp 20   Ht 5\' 9"  (1.753 m)   Wt 114.3 kg   SpO2 99%   BMI 37.21 kg/m   . General: 64 y.o. year-old male well developed well nourished in no acute distress.  Alert and oriented x3. Marland Kitchen. HEENT: NCAT, EOMI, PERRLA . Neck: Supple, trachea medial . Cardiovascular: Regular rate and rhythm with no rubs or gallops.  No thyromegaly or JVD noted.  No lower extremity edema. 2/4 pulses in all 4 extremities. Marland Kitchen. Respiratory: Clear to auscultation with no wheezes or rales. Good inspiratory effort. . Abdomen: Soft nontender nondistended with normal bowel sounds x4 quadrants. .Marland Kitchen  Muskuloskeletal: No cyanosis, clubbing or edema noted bilaterally . Neuro: CN II-XII intact, strength, sensation, reflexes . Skin: No ulcerative lesions noted or rashes . Psychiatry: Judgement and insight appear normal. Mood is appropriate for condition and setting          Labs on Admission:  Basic Metabolic Panel: Recent Labs  Lab 12/20/19 1855  NA 136  K 4.3  CL 104  CO2 20*  GLUCOSE 85  BUN 20  CREATININE 1.29*  CALCIUM 9.1   Liver Function Tests: No results for input(s): AST, ALT, ALKPHOS, BILITOT, PROT, ALBUMIN in the last 168 hours. No results for input(s): LIPASE, AMYLASE in the last 168 hours. No results for input(s): AMMONIA in the last 168 hours. CBC: Recent Labs  Lab 12/20/19 1855  WBC 6.8  NEUTROABS 4.5  HGB 14.1  HCT 42.6  MCV 93.6  PLT 244   Cardiac Enzymes: No results for input(s): CKTOTAL, CKMB, CKMBINDEX, TROPONINI in the last 168 hours.  BNP (last 3 results) Recent Labs    03/22/19 1814  BNP 159.0*     ProBNP (last 3 results) No results for input(s): PROBNP in the last 8760 hours.  CBG: No results for input(s): GLUCAP in the last 168 hours.  Radiological Exams on Admission: DG Chest 2 View  Result Date: 12/20/2019 CLINICAL DATA:  64 year old male with shortness of breath. EXAM: CHEST - 2 VIEW COMPARISON:  Chest radiograph dated 07/08/2019. FINDINGS: No focal consolidation, pleural effusion, pneumothorax. There is diffuse interstitial coarsening and minimal left lung base atelectasis. The cardiac silhouette is within limits. Median sternotomy wires and CABG vascular clips. Atherosclerotic calcification of the aorta. No acute osseous pathology. Degenerative changes of the spine. IMPRESSION: No acute cardiopulmonary process. Electronically Signed   By: Elgie Collard M.D.   On: 12/20/2019 18:51   CT Head Wo Contrast  Result Date: 12/20/2019 CLINICAL DATA:  Ataxia weakness EXAM: CT HEAD WITHOUT CONTRAST TECHNIQUE: Contiguous axial images were obtained from the base of the skull through the vertex without intravenous contrast. COMPARISON:  None. FINDINGS: Brain: No acute territorial infarction, hemorrhage, or intracranial mass. The ventricles are nonenlarged. Mild atrophy. Minimal hypodensity in the white matter likely chronic small vessel ischemic change. Vascular: No hyperdense vessels. Vertebral and carotid vascular calcification Skull: No fracture. Focal sclerosis in the left frontal bone possible bone island. Sinuses/Orbits: No acute finding. Other: None IMPRESSION: 1. No CT evidence for acute intracranial abnormality. 2. Atrophy and minimal small vessel ischemic changes of the white matter. Electronically Signed   By: Jasmine Pang M.D.   On: 12/20/2019 22:45   CT Angio Chest PE W/Cm &/Or Wo Cm  Result Date: 12/20/2019 CLINICAL DATA:  Shortness of breath with intermittent chest pain EXAM: CT ANGIOGRAPHY CHEST WITH CONTRAST TECHNIQUE: Multidetector CT imaging of the chest was performed using  the standard protocol during bolus administration of intravenous contrast. Multiplanar CT image reconstructions and MIPs were obtained to evaluate the vascular anatomy. CONTRAST:  OMNIPAQUE IOHEXOL 350 MG/ML SOLN COMPARISON:  Chest x-ray 12/20/2019 FINDINGS: Cardiovascular: Satisfactory opacification of the pulmonary arteries to the segmental level. No evidence of pulmonary embolism. Nonaneurysmal aorta. Mild aortic atherosclerosis. Post CABG changes. Coronary vascular calcification. No significant pericardial effusion. Mediastinum/Nodes: No enlarged mediastinal, hilar, or axillary lymph nodes. Thyroid gland, trachea, and esophagus demonstrate no significant findings. Lungs/Pleura: Lungs are clear. No pleural effusion or pneumothorax. Upper Abdomen: No acute abnormality. Musculoskeletal: No chest wall abnormality. No acute or significant osseous findings. Review of the MIP images confirms the above  findings. IMPRESSION: 1. Negative for acute pulmonary embolus. Clear lung fields. 2. Aortic atherosclerosis. Aortic Atherosclerosis (ICD10-I70.0). Electronically Signed   By: Jasmine Pang M.D.   On: 12/20/2019 22:42    EKG: I independently viewed the EKG done and my findings are as followed: Normal sinus rhythm at a rate of 91 bpm with no significant change from prior EKG done on 07/07/19  Assessment/Plan Present on Admission: . Chest pain . CAD (coronary artery disease) of bypass graft . Type 2 diabetes mellitus with other specified complication (HCC) . Paroxysmal atrial fibrillation (HCC) . HTN (hypertension) . HLD (hyperlipidemia) . GERD (gastroesophageal reflux disease) . BPH (benign prostatic hyperplasia)  Principal Problem:   Chest pain Active Problems:   CAD (coronary artery disease) of bypass graft   Type 2 diabetes mellitus with other specified complication (HCC)   Paroxysmal atrial fibrillation (HCC)   HTN (hypertension)   HLD (hyperlipidemia)   BPH (benign prostatic hyperplasia)    GERD (gastroesophageal reflux disease)   Hypothyroidism   Atypical chest pain rule out ACS Patient will be placed in Observation status on  telemetry monitored unit Troponin x 2 was flat at 8  EKG showed Normal sinus rhythm at a rate of 91 bpm with no significant change from prior EKG done on 07/07/19 Echocardiogram done on 07/09/2019 showed LVEF 60-65% with elevated left atrial pressure. He was seen by cardiologist in January (Dr. Bufford Lope) due to precordial chest pain with exertion and he underwent NM myocardial perfusion stress test that was normal at that time Cardiology will be consulted to help decide if Stress test is needed in am Versus other diagnostic modalities.    Temporarily hold Coreg when released from pharmacy until it's certain the patient will not undergo stress test  CAD s/p CABG Continue management as per above Continue atorvastatin, Ranexa, Imdur per home regimen when med rec is updated Continue beta-blockers indicated above  Type 2 diabetes mellitus with other specified complication (HCC) Continue insulin sliding scale and hypoglycemia protocol  Unspecified atrial fibrillation (HCC) CHA2DS2-VASc Score of at least 3. Continue apixaban and carvedilol (as indicated above)   HTN (hypertension) Continue amlodipine 10 mg p.o. daily. Continue carvedilol 6.25 mg p.o. twice daily.    HLD (hyperlipidemia) Continue atorvastatin 80 mg p.o. daily. Monitor LFTs periodically.    BPH (benign prostatic hyperplasia) Continue tamsulosin 0.4 mg p.o. daily. Continue finasteride 5 mg p.o. daily.    GERD (gastroesophageal reflux disease) Continue PPI.   DVT prophylaxis:  Continue apixaban when med rec is updated Code Status: Full code. Family Communication: None at bedside Disposition Plan: Patient is from:                        home Anticipated DC to:                  Correctional facility  Anticipated DC date:               24 Hours Anticipated DC barriers:            Pending further cardiac work-up  Consults called: Cardiology Admission status:  Observation  Frankey Shown MD [facility]  If 7PM-7AM, please contact night-coverage [email or web address]  12/21/2019, 1:23 AM

## 2019-12-20 NOTE — ED Provider Notes (Signed)
[facility] EMERGENCY DEPARTMENT Provider Note   CSN: [id number] Arrival date & time: 12/20/19  1741     History Chief Complaint  Patient presents with  . Shortness of Breath    Kevin Ballard is a 64 y.o. male.  He has a history of coronary disease A. fib diabetes hypertension hypercholesterolemia.  He is on anticoagulation.  He is complaining of several weeks of dyspnea on exertion.  Sometimes gets chest pain.  Has felt generally fatigued.  He says he really has not felt right since he had Covid last fall.  He also says he is dizzy all the time and sometimes is unsteady when he is walking or if he moves fast.  No blurry vision double vision difficulty with speech or focal weakness.  He is newly on Cymbalta and up until yesterday was newly on tramadol.  He has a neurology appointment coming up this week for some chronic neck issues.  The history is provided by the patient.  Shortness of Breath Severity:  Moderate Onset quality:  Gradual Timing:  Intermittent Progression:  Worsening Chronicity:  New Context: activity   Relieved by:  Rest Worsened by:  Activity Ineffective treatments:  None tried Associated symptoms: chest pain, headaches and neck pain   Associated symptoms: no abdominal pain, no cough, no fever, no hemoptysis, no rash, no sore throat and no vomiting        Past Medical History:  Diagnosis Date  . Atrial fibrillation (HCC)   . BPH (benign prostatic hyperplasia)   . Coronary artery disease   . Coronary artery disease involving coronary bypass graft   . Diabetes mellitus without complication (HCC)   . GERD (gastroesophageal reflux disease)   . Hypercholesterolemia   . Hypertension   . Myocardial infarct [facility])     Patient Active Problem List   Diagnosis Date Noted  . Chest pain 07/08/2019  . Pneumonia due to COVID-19 virus 03/22/2019  . Hypophosphatemia 03/22/2019  . Hypokalemia 03/22/2019  . Hyponatremia 03/22/2019  . GERD (gastroesophageal reflux  disease)   . CAD (coronary artery disease) of bypass graft 12/19/2017  . Type 2 diabetes mellitus with other specified complication (HCC) 12/19/2017  . Paroxysmal atrial fibrillation (HCC) 12/19/2017  . Chronic anticoagulation 12/19/2017  . HTN (hypertension) 12/19/2017  . HLD (hyperlipidemia) 12/19/2017  . BPH (benign prostatic hyperplasia) 12/19/2017    Past Surgical History:  Procedure Laterality Date  . LEFT HEART CATH AND CORS/GRAFTS ANGIOGRAPHY N/A 12/21/2017   Procedure: LEFT HEART CATH AND CORS/GRAFTS ANGIOGRAPHY;  Surgeon: [place], Peter M, MD;  Location: [facility] INVASIVE CV LAB;  Service: Cardiovascular;  Laterality: N/A;  . LUMBAR FUSION    . MEDIAL COLLATERAL LIGAMENT REPAIR, KNEE    . TIBIAL FASCIOTOMY    . tibiastomy         Family History  Problem Relation Age of Onset  . Heart attack Mother   . Cancer Mother   . Heart attack Maternal Grandmother     Social History   Tobacco Use  . Smoking status: Never Smoker  . Smokeless tobacco: Never Used  Vaping Use  . Vaping Use: Never used  Substance Use Topics  . Alcohol use: Never  . Drug use: Never    Home Medications Prior to Admission medications   Medication Sig Start Date End Date Taking? Authorizing Provider  acetaminophen (TYLENOL) 325 MG tablet Take 2 tablets (650 mg total) by mouth every 4 (four) hours as needed for headache or mild pain. 12/22/17   McClung,  Elpidio Eric, MD  amLODipine (NORVASC) 10 MG tablet Take 10 mg by mouth daily.    [provider]  apixaban (ELIQUIS) 5 MG TABS tablet Take 5 mg by mouth 2 (two) times daily.    [provider]  aspirin EC 81 MG tablet Take 81 mg by mouth daily.    [provider]  atorvastatin (LIPITOR) 80 MG tablet Take 80 mg by mouth at bedtime.     [provider]  carvedilol (COREG) 6.25 MG tablet Take 1 tablet (6.25 mg total) by mouth 2 (two) times daily with a meal. 12/22/17   Lonia Blood, MD  finasteride (PROSCAR) 5 MG tablet  Take 5 mg by mouth daily.    [provider]  insulin aspart (NOVOLOG) 100 UNIT/ML injection Inject 0-15 Units into the skin 3 (three) times daily with meals. 07/11/19   Orpah Cobb, MD  insulin glargine (LANTUS) 100 UNIT/ML injection Inject 30 Units into the skin at bedtime.     [provider]  isosorbide mononitrate (IMDUR) 120 MG 24 hr tablet Take 1 tablet (120 mg total) by mouth daily. 07/12/19   Orpah Cobb, MD  levothyroxine (SYNTHROID) 75 MCG tablet Take 75 mcg by mouth daily before breakfast.    [provider]  metFORMIN (GLUCOPHAGE) 1000 MG tablet Take 1,000 mg by mouth daily.    [provider]  nitroGLYCERIN (NITROSTAT) 0.4 MG SL tablet Place 0.4 mg under the tongue every 5 (five) minutes as needed for chest pain.     [provider]  omeprazole (PRILOSEC) 20 MG capsule Take 20 mg by mouth every evening.     [provider]  ranolazine (RANEXA) 1000 MG SR tablet Take 1 tablet (1,000 mg total) by mouth 2 (two) times daily. 12/22/17   Lonia Blood, MD  tamsulosin (FLOMAX) 0.4 MG CAPS capsule Take 0.4 mg by mouth daily.    [provider]  traZODone (DESYREL) 50 MG tablet Take 1 tablet (50 mg total) by mouth at bedtime. 07/11/19   Orpah Cobb, MD    Allergies    Brilinta [ticagrelor]  Review of Systems   Review of Systems  Constitutional: Negative for fever.  HENT: Negative for sore throat.   Eyes: Negative for visual disturbance.  Respiratory: Positive for shortness of breath. Negative for cough and hemoptysis.   Cardiovascular: Positive for chest pain.  Gastrointestinal: Negative for abdominal pain and vomiting.  Genitourinary: Negative for dysuria.  Musculoskeletal: Positive for neck pain.  Skin: Negative for rash.  Neurological: Positive for light-headedness and headaches.    Physical Exam Updated Vital Signs BP (!) 160/68 (BP Location: Right Arm)   Pulse 84   Temp 98.5 F (36.9 C) (Oral)   Resp 16    Ht  (1.753 m)   Wt 114.3 kg   SpO2 98%   BMI 37.21 kg/m   Physical Exam Vitals and nursing note reviewed.  Constitutional:      Appearance: He is well-developed.  HENT:     Head: Normocephalic and atraumatic.  Eyes:     Conjunctiva/sclera: Conjunctivae normal.  Neck:     Vascular: No carotid bruit.  Cardiovascular:     Rate and Rhythm: Normal rate and regular rhythm.     Heart sounds: No murmur heard.   Pulmonary:     Effort: Pulmonary effort is normal. No respiratory distress.     Breath sounds: Normal breath sounds.  Abdominal:     Palpations: Abdomen is soft.  Tenderness: There is no abdominal tenderness.  Musculoskeletal:        General: No deformity. Normal range of motion.     Right lower leg: Tenderness present.     Left lower leg: Tenderness present.  Skin:    General: Skin is warm and dry.     Capillary Refill: Capillary refill takes less than 2 seconds.  Neurological:     General: No focal deficit present.     Mental Status: He is alert and oriented to person, place, and time.     GCS: GCS eye subscore is 4. GCS verbal subscore is 5. GCS motor subscore is 6.     Cranial Nerves: No cranial nerve deficit.     Sensory: No sensory deficit.     Motor: No weakness.     ED Results / Procedures / Treatments   Labs (all labs ordered are listed, but only abnormal results are displayed) Labs Reviewed  BASIC METABOLIC PANEL - Abnormal; Notable for the following components:      Result Value   CO2 20 (*)    Creatinine, Ser 1.29 (*)    GFR calc non Af Amer 58 (*)    All other components within normal limits  COMPREHENSIVE METABOLIC PANEL - Abnormal; Notable for the following components:   Glucose, Bld 147 (*)    All other components within normal limits  HEMOGLOBIN A1C - Abnormal; Notable for the following components:   Hgb A1c MFr Bld 6.4 (*)    All other components within normal limits  SARS CORONAVIRUS 2 BY RT PCR (HOSPITAL ORDER, PERFORMED IN [facility])  MRSA PCR SCREENING  CBC WITH DIFFERENTIAL/PLATELET  CBC  PROTIME-INR  APTT  TROPONIN I (HIGH SENSITIVITY)  TROPONIN I (HIGH SENSITIVITY)    EKG EKG Interpretation  Date/Time:  Tuesday December 20 2019 18:09:12 EDT Ventricular Rate:  91 PR Interval:  176 QRS Duration: 86 QT Interval:  390 QTC Calculation: 479 R Axis:   135 Text Interpretation: Normal sinus rhythm Left posterior fascicular block Cannot rule out Inferior infarct , age undetermined Abnormal ECG No significant change since prior 1/21 Confirmed by Meridee Score [phone]) on 12/20/2019 6:14:34 PM   Radiology DG Chest 2 View  Result Date: 12/20/2019 CLINICAL DATA:  64 year old male with shortness of breath. EXAM: CHEST - 2 VIEW COMPARISON:  Chest radiograph dated 07/08/2019. FINDINGS: No focal consolidation, pleural effusion, pneumothorax. There is diffuse interstitial coarsening and minimal left lung base atelectasis. The cardiac silhouette is within limits. Median sternotomy wires and CABG vascular clips. Atherosclerotic calcification of the aorta. No acute osseous pathology. Degenerative changes of the spine. IMPRESSION: No acute cardiopulmonary process. Electronically Signed   By: Elgie Collard M.D.   On: 12/20/2019 18:51   CT Head Wo Contrast  Result Date: 12/20/2019 CLINICAL DATA:  Ataxia weakness EXAM: CT HEAD WITHOUT CONTRAST TECHNIQUE: Contiguous axial images were obtained from the base of the skull through the vertex without intravenous contrast. COMPARISON:  None. FINDINGS: Brain: No acute territorial infarction, hemorrhage, or intracranial mass. The ventricles are nonenlarged. Mild atrophy. Minimal hypodensity in the white matter likely chronic small vessel ischemic change. Vascular: No hyperdense vessels. Vertebral and carotid vascular calcification Skull: No fracture. Focal sclerosis in the left frontal bone possible bone island. Sinuses/Orbits: No acute finding. Other: None IMPRESSION: 1. No CT  evidence for acute intracranial abnormality. 2. Atrophy and minimal small vessel ischemic changes of the white matter. Electronically Signed   By: Adrian Prows.D.  On: 12/20/2019 22:45   CT Angio Chest PE W/Cm &/Or Wo Cm  Result Date: 12/20/2019 CLINICAL DATA:  Shortness of breath with intermittent chest pain EXAM: CT ANGIOGRAPHY CHEST WITH CONTRAST TECHNIQUE: Multidetector CT imaging of the chest was performed using the standard protocol during bolus administration of intravenous contrast. Multiplanar CT image reconstructions and MIPs were obtained to evaluate the vascular anatomy. CONTRAST:  100mL OMNIPAQUE IOHEXOL 350 MG/ML SOLN COMPARISON:  Chest x-ray 12/20/2019 FINDINGS: Cardiovascular: Satisfactory opacification of the pulmonary arteries to the segmental level. No evidence of pulmonary embolism. Nonaneurysmal aorta. Mild aortic atherosclerosis. Post CABG changes. Coronary vascular calcification. No significant pericardial effusion. Mediastinum/Nodes: No enlarged mediastinal, hilar, or axillary lymph nodes. Thyroid gland, trachea, and esophagus demonstrate no significant findings. Lungs/Pleura: Lungs are clear. No pleural effusion or pneumothorax. Upper Abdomen: No acute abnormality. Musculoskeletal: No chest wall abnormality. No acute or significant osseous findings. Review of the MIP images confirms the above findings. IMPRESSION: 1. Negative for acute pulmonary embolus. Clear lung fields. 2. Aortic atherosclerosis. Aortic Atherosclerosis (ICD10-I70.0). Electronically Signed   By: Jasmine PangKim  Fujinaga M.D.   On: 12/20/2019 22:42    Procedures Procedures (including critical care time)  Medications Ordered in ED Medications  feeding supplement (ENSURE ENLIVE) (ENSURE ENLIVE) liquid 237 mL (has no administration in time range)  aspirin EC tablet 81 mg (has no administration in time range)  atorvastatin (LIPITOR) tablet 80 mg (has no administration in time range)  amLODipine (NORVASC) tablet 10 mg  (has no administration in time range)  levothyroxine (SYNTHROID) tablet 75 mcg (75 mcg Oral Given 12/21/19 0610)  pantoprazole (PROTONIX) EC tablet 40 mg (has no administration in time range)  finasteride (PROSCAR) tablet 5 mg (has no administration in time range)  oxybutynin (DITROPAN-XL) 24 hr tablet 15 mg (has no administration in time range)  tamsulosin (FLOMAX) capsule 0.4 mg (has no administration in time range)  ranolazine (RANEXA) 12 hr tablet 1,000 mg (has no administration in time range)  insulin aspart (novoLOG) injection 0-9 Units (has no administration in time range)  insulin aspart (novoLOG) injection 0-5 Units (has no administration in time range)  carvedilol (COREG) tablet 3.125 mg (has no administration in time range)  isosorbide mononitrate (IMDUR) 24 hr tablet 60 mg (has no administration in time range)  iohexol (OMNIPAQUE) 350 MG/ML injection 100 mL (100 mLs Intravenous Contrast Given 12/20/19 2226)    ED Course  I have reviewed the triage vital signs and the nursing notes.  Pertinent labs & imaging results that were available during my care of the patient were reviewed by me and considered in my medical decision making (see chart for details).  Clinical Course as of Dec 20 940  Tue Dec 20, 2019  2259 Patient's here score is a 6.  No evidence of active ischemia.  Last nuclear test in January.  Feel he needs to come in and get an echo in the morning to make sure his ejection fraction has not taken ahead.  Due to his unfortunate situation and [facility] getting urgent outpatient work-up would be very difficult.  Will review with hospitalist.   [MB]  2259 1/21 - nuclear - IMPRESSION: 1. No clear evidence for reversibility ischemia. Perfusion in the anteroseptal wall near the basal segment is equivocal but suspect artifact and technical issues in this area.  2. Normal left ventricular wall motion except for the anteroseptal wall which may be related to technical issues or  artifact.  3. Left ventricular ejection fraction 52%  4. Non invasive  risk stratification*: Low   [MB]    Clinical Course User Index [MB] Terrilee Files, MD   MDM Rules/Calculators/A&P                         This patient complains of dyspnea on exertion, chest pain, dizziness; this involves an extensive number of treatment Options and is a complaint that carries with it a high risk of complications and Morbidity. The differential includes arrhythmia, ACS, pneumonia, CHF, PE, anemia, metabolic derangement  I ordered, reviewed and interpreted labs, which included CBC with normal white count normal hemoglobin chemistries normal other than elevated creatinine, normal LFTs, delta troponin negative I ordered imaging studies which included chest x-ray and CT head CT chest and I independently    visualized and interpreted imaging which showed no acute findings Previous records obtained and reviewed in epic including admission in January for chest pain shortness of breath with no clear etiology identified I consulted [facility] hospitalist Dr. Thomes Dinning and discussed lab and imaging findings  Critical Interventions: None  After the interventions stated above, I reevaluated the patient and found patient to be hemodynamically stable. Although his work-up has so far not identified any serious causes of his symptoms his hear score is 6 and this is a high risk complaint. We'll lobes for further cardiac work-up.   Final Clinical Impression(s) / ED Diagnoses Final diagnoses:  DOE (dyspnea on exertion)    Rx / DC Orders ED Discharge Orders    None       Terrilee Files, MD 12/21/19 [phone]

## 2019-12-20 NOTE — ED Triage Notes (Signed)
Pt c/o shortness of breath, intermittent chest pain, and weakness for the last several weeks.

## 2019-12-21 DIAGNOSIS — I480 Paroxysmal atrial fibrillation: Secondary | ICD-10-CM | POA: Diagnosis not present

## 2019-12-21 DIAGNOSIS — I25119 Atherosclerotic heart disease of native coronary artery with unspecified angina pectoris: Secondary | ICD-10-CM | POA: Diagnosis not present

## 2019-12-21 DIAGNOSIS — R42 Dizziness and giddiness: Secondary | ICD-10-CM

## 2019-12-21 DIAGNOSIS — R079 Chest pain, unspecified: Secondary | ICD-10-CM | POA: Diagnosis not present

## 2019-12-21 DIAGNOSIS — E039 Hypothyroidism, unspecified: Secondary | ICD-10-CM

## 2019-12-21 LAB — CBC
HCT: 42 % (ref 39.0–52.0)
Hemoglobin: 13.8 g/dL (ref 13.0–17.0)
MCH: 30.7 pg (ref 26.0–34.0)
MCHC: 32.9 g/dL (ref 30.0–36.0)
MCV: 93.3 fL (ref 80.0–100.0)
Platelets: 239 10*3/uL (ref 150–400)
RBC: 4.5 MIL/uL (ref 4.22–5.81)
RDW: 13.5 % (ref 11.5–15.5)
WBC: 6.6 10*3/uL (ref 4.0–10.5)
nRBC: 0 % (ref 0.0–0.2)

## 2019-12-21 LAB — SARS CORONAVIRUS 2 BY RT PCR (HOSPITAL ORDER, PERFORMED IN ~~LOC~~ HOSPITAL LAB): SARS Coronavirus 2: NEGATIVE

## 2019-12-21 LAB — COMPREHENSIVE METABOLIC PANEL
ALT: 18 U/L (ref 0–44)
AST: 15 U/L (ref 15–41)
Albumin: 3.9 g/dL (ref 3.5–5.0)
Alkaline Phosphatase: 52 U/L (ref 38–126)
Anion gap: 10 (ref 5–15)
BUN: 18 mg/dL (ref 8–23)
CO2: 23 mmol/L (ref 22–32)
Calcium: 8.9 mg/dL (ref 8.9–10.3)
Chloride: 103 mmol/L (ref 98–111)
Creatinine, Ser: 1.12 mg/dL (ref 0.61–1.24)
GFR calc Af Amer: >60 mL/min (ref >60)
GFR calc non Af Amer: >60 mL/min (ref >60)
Glucose, Bld: 147 mg/dL — ABNORMAL HIGH (ref 70–99)
Potassium: 3.5 mmol/L (ref 3.5–5.1)
Sodium: 136 mmol/L (ref 135–145)
Total Bilirubin: 0.7 mg/dL (ref 0.3–1.2)
Total Protein: 6.5 g/dL (ref 6.5–8.1)

## 2019-12-21 LAB — APTT: aPTT: 32 seconds (ref 24–36)

## 2019-12-21 LAB — HEMOGLOBIN A1C
Hgb A1c MFr Bld: 6.4 % — ABNORMAL HIGH (ref 4.8–5.6)
Mean Plasma Glucose: 136.98 mg/dL

## 2019-12-21 LAB — PROTIME-INR
INR: 1.2 (ref 0.8–1.2)
Prothrombin Time: 14.7 seconds (ref 11.4–15.2)

## 2019-12-21 LAB — MRSA PCR SCREENING: MRSA by PCR: NEGATIVE

## 2019-12-21 LAB — GLUCOSE, CAPILLARY: Glucose-Capillary: 270 mg/dL — ABNORMAL HIGH (ref 70–99)

## 2019-12-21 MED ORDER — PANTOPRAZOLE SODIUM 40 MG PO TBEC
40.0000 mg | DELAYED_RELEASE_TABLET | Freq: Every day | ORAL | Status: DC
  Administered 2019-12-21: 40 mg via ORAL
  Filled 2019-12-21: qty 1

## 2019-12-21 MED ORDER — ATORVASTATIN CALCIUM 40 MG PO TABS: 80.0000 mg | ORAL_TABLET | Freq: Every day | ORAL | Status: DC

## 2019-12-21 MED ORDER — CARVEDILOL 3.125 MG PO TABS: 3.1250 mg | ORAL_TABLET | Freq: Two times a day (BID) | ORAL | Status: DC

## 2019-12-21 MED ORDER — INSULIN ASPART 100 UNIT/ML ~~LOC~~ SOLN: 0.0000 [IU] | Freq: Every day | SUBCUTANEOUS | Status: DC

## 2019-12-21 MED ORDER — ASPIRIN EC 81 MG PO TBEC
81.0000 mg | DELAYED_RELEASE_TABLET | Freq: Every day | ORAL | Status: DC
  Administered 2019-12-21: 81 mg via ORAL
  Filled 2019-12-21: qty 1

## 2019-12-21 MED ORDER — ENSURE ENLIVE PO LIQD: 237.0000 mL | Freq: Two times a day (BID) | ORAL | Status: DC

## 2019-12-21 MED ORDER — OXYBUTYNIN CHLORIDE ER 5 MG PO TB24: 15.0000 mg | ORAL_TABLET | Freq: Every day | ORAL | Status: DC

## 2019-12-21 MED ORDER — APIXABAN 5 MG PO TABS: 5.0000 mg | ORAL_TABLET | Freq: Two times a day (BID) | ORAL | Status: DC

## 2019-12-21 MED ORDER — TAMSULOSIN HCL 0.4 MG PO CAPS
0.4000 mg | ORAL_CAPSULE | Freq: Every day | ORAL | Status: DC
  Administered 2019-12-21: 0.4 mg via ORAL
  Filled 2019-12-21: qty 1

## 2019-12-21 MED ORDER — INSULIN ASPART 100 UNIT/ML ~~LOC~~ SOLN
0.0000 [IU] | Freq: Three times a day (TID) | SUBCUTANEOUS | Status: DC
  Administered 2019-12-21: 5 [IU] via SUBCUTANEOUS

## 2019-12-21 MED ORDER — LEVOTHYROXINE SODIUM 75 MCG PO TABS
75.0000 ug | ORAL_TABLET | Freq: Every day | ORAL | Status: DC
  Administered 2019-12-21: 75 ug via ORAL
  Filled 2019-12-21: qty 1

## 2019-12-21 MED ORDER — ISOSORBIDE MONONITRATE ER 60 MG PO TB24: 60.0000 mg | ORAL_TABLET | Freq: Two times a day (BID) | ORAL | 5 refills | Status: AC

## 2019-12-21 MED ORDER — ASPIRIN EC 81 MG PO TBEC: 81.0000 mg | DELAYED_RELEASE_TABLET | Freq: Every day | ORAL | 11 refills | Status: AC

## 2019-12-21 MED ORDER — ISOSORBIDE MONONITRATE ER 60 MG PO TB24
60.0000 mg | ORAL_TABLET | Freq: Two times a day (BID) | ORAL | Status: DC
  Administered 2019-12-21: 60 mg via ORAL

## 2019-12-21 MED ORDER — FINASTERIDE 5 MG PO TABS
5.0000 mg | ORAL_TABLET | Freq: Every day | ORAL | Status: DC
  Administered 2019-12-21: 5 mg via ORAL
  Filled 2019-12-21: qty 1

## 2019-12-21 MED ORDER — AMLODIPINE BESYLATE 5 MG PO TABS
10.0000 mg | ORAL_TABLET | Freq: Every day | ORAL | Status: DC
  Administered 2019-12-21: 10 mg via ORAL
  Filled 2019-12-21: qty 2

## 2019-12-21 MED ORDER — ISOSORBIDE MONONITRATE ER 60 MG PO TB24
60.0000 mg | ORAL_TABLET | Freq: Every day | ORAL | Status: DC
  Filled 2019-12-21: qty 1

## 2019-12-21 MED ORDER — RANOLAZINE ER 500 MG PO TB12
1000.0000 mg | ORAL_TABLET | Freq: Two times a day (BID) | ORAL | Status: DC
  Administered 2019-12-21: 1000 mg via ORAL
  Filled 2019-12-21: qty 2

## 2019-12-21 NOTE — Discharge Instructions (Signed)
1) please increase isosorbide to 60 mg twice daily 2)Avoid ibuprofen/Advil/Aleve/Motrin/Goody Powders/Naproxen/BC powders/Meloxicam/Diclofenac/Indomethacin and other Nonsteroidal anti-inflammatory medications as these will make you more likely to bleed and can cause stomach ulcers, can also cause Kidney problems.   3)--- please follow-up with PCP at the correctional facility for further adjustments of medications including  diabetic medications

## 2019-12-21 NOTE — Discharge Summary (Signed)
Kevin Ballard, is a 64 y.o. male  DOB Oct 17, 1955  MRN [id number].  Admission date:  12/20/2019  Admitting Physician  Frankey Shown, DO  Discharge Date:  12/21/2019   Primary MD  Patient, No Pcp Per  Recommendations for primary care physician for things to follow:   1) please increase isosorbide to 60 mg twice daily 2)Avoid ibuprofen/Advil/Aleve/Motrin/Goody Powders/Naproxen/BC powders/Meloxicam/Diclofenac/Indomethacin and other Nonsteroidal anti-inflammatory medications as these will make you more likely to bleed and can cause stomach ulcers, can also cause Kidney problems.   3)--- please follow-up with PCP at the correctional facility for further adjustments of medications including  diabetic medications   Admission Diagnosis  DOE (dyspnea on exertion) [R06.00] Chest pain [R07.9]   Discharge Diagnosis  DOE (dyspnea on exertion) [R06.00] Chest pain [R07.9]   Principal Problem:   Chest pain Active Problems:   CAD (coronary artery disease) of bypass graft   Type 2 diabetes mellitus with other specified complication (HCC)   Paroxysmal atrial fibrillation (HCC)   HTN (hypertension)   HLD (hyperlipidemia)   BPH (benign prostatic hyperplasia)   GERD (gastroesophageal reflux disease)   Hypothyroidism      Past Medical History:  Diagnosis Date  . Atrial fibrillation (HCC)   . BPH (benign prostatic hyperplasia)   . Coronary artery disease   . Coronary artery disease involving coronary bypass graft   . Diabetes mellitus without complication (HCC)   . GERD (gastroesophageal reflux disease)   . Hypercholesterolemia   . Hypertension   . Myocardial infarct [facility])     Past Surgical History:  Procedure Laterality Date  . LEFT HEART CATH AND CORS/GRAFTS ANGIOGRAPHY N/A 12/21/2017   Procedure: LEFT HEART CATH AND CORS/GRAFTS ANGIOGRAPHY;  Surgeon: [place], Peter M, MD;  Location: [facility] INVASIVE CV LAB;   Service: Cardiovascular;  Laterality: N/A;  . LUMBAR FUSION    . MEDIAL COLLATERAL LIGAMENT REPAIR, KNEE    . TIBIAL FASCIOTOMY    . tibiastomy         HPI  from the history and physical done on the day of admission:    [facility] from: [facility]  Chief Complaint: Shortness of breath  HPI: Kevin Ballard is a 64 y.o. male with medical history significant for paroxysmal atrial fibrillation, BPH, CAD/history of MI/CABG, type 2 diabetes, GERD, hyperlipidemia and hypertension who presents to the emergency department due to complaint of several weeks of shortness of breath on exertion associated with chest pain, dizziness and fatigue.  He states that he recently had a sharp stabbing pain to his head which lasted only for a few seconds several weeks ago and his symptoms including being unsteady when walking has since worsened. Patient states that he has never felt right since he had Covid last fall.  He has an appointment with a neurologist at [facility] on Thursday (7/8) due to gait imbalance and neck pain, but decided to go to the ED due to persistent nonreproducible intermittent chest pain that usually resolve with 1 sublingual nitroglycerin.  ED Course:  In the  ED, BP was 160/68 on arrival, other vital signs were within normal range.  Work-up in the ED showed normal CBC and BMP except for creatinine at 1.29, this was (1.0-1.19 about 8 months ago).  CT head without contrast showed no acute medical abnormality.  CT angiography of chest ruled out pulmonary embolus.  Chest x-ray showed no acute cardiopulmonary process.  Review of Systems: Constitutional: Negative forchillsand fever.  HENT: Negative forear painand sore throat.  Eyes: Negative forpainand visual disturbance.  Respiratory: Positive for shortness of breath.  Negative forcough andchest tightness Cardiovascular: Positive forchest pain.  Negative for palpitations.  Gastrointestinal: Negative forabdominal painand  vomiting.  Endocrine: Negative forpolyphagiaand polyuria.  Genitourinary: Negative fordecreased urine volume,dysuria,enuresis,hematuria,vaginal dischargeand vaginal pain.  Musculoskeletal: Positive for neck pain.  Negative forarthralgias Skin: Negative forcolor changeand rash.  Allergic/Immunologic: Negative forimmunocompromised state.  Neurological: Positive for lightheadedness, weakness and headache.  Negative fortremors,syncope,speech difficulty Hematological:Does not bruise/bleed easily. All other systems reviewed and are negative      Hospital Course:       1)Atypical chest pain--- patient ruled out for ACS by cardiac enzymes and EKG -Echocardiogram from July 09, 2019 with EF of 60 to 65%, -Nuclear stress test from January 2021 without significant reversible ischemia -Cardiology consult appreciated, recommends medical management including increasing isosorbide to 60 mg twice daily from daily dosing -CTA chest without new acute findings  2)History of CAD--- status post prior CABG--continue current meds, increase isosorbide as above #1 -Continue Lipitor, Coreg, Ranexa  3) Obesity- -Low calorie diet, portion control and increase physical activity discussed with patient -Body mass index is 37.21 kg/m.  4)DM2-A1c 6.4 reflecting excellent diabetic control PTA, continue preadmission diabetic regimen  5) paroxysmal atrial fibrillation----CHadsvascular score of 3, continue apixaban for stroke prophylaxis and Coreg for rate control  6) history of BPH--continue Flomax and Proscar  7)HTN--stable, amlodipine and Coreg as ordered  8)GERD--stable continue PPI  9)Hypothyroidism--stable, continue levothyroxine 75 mg daily  Discharge Condition: stable, chest pain-free  Follow UP=-=--PCP at the correctional facility   Consults obtained - cardiology  Diet and Activity recommendation:  As advised  Discharge Instructions    Discharge Instructions    Call MD  for:  difficulty breathing, headache or visual disturbances   Complete by: As directed    Call MD for:  persistant dizziness or light-headedness   Complete by: As directed    Call MD for:  persistant nausea and vomiting   Complete by: As directed    Call MD for:  temperature >100.4   Complete by: As directed    Diet - low sodium heart healthy   Complete by: As directed    Diet Carb Modified   Complete by: As directed    Discharge instructions   Complete by: As directed    1) please increase isosorbide to 60 mg twice daily 2)Avoid ibuprofen/Advil/Aleve/Motrin/Goody Powders/Naproxen/BC powders/Meloxicam/Diclofenac/Indomethacin and other Nonsteroidal anti-inflammatory medications as these will make you more likely to bleed and can cause stomach ulcers, can also cause Kidney problems.   3)--- please follow-up with PCP at the correctional facility for further adjustments of medications including  diabetic medications   Increase activity slowly   Complete by: As directed         Discharge Medications     Allergies as of 12/21/2019      Reactions   Brilinta [ticagrelor] Shortness Of Breath      Medication List    STOP taking these medications   insulin aspart 100 UNIT/ML injection Commonly known as:  novoLOG     TAKE these medications   acetaminophen 325 MG tablet Commonly known as: TYLENOL Take 2 tablets (650 mg total) by mouth every 4 (four) hours as needed for headache or mild pain.   amLODipine 10 MG tablet Commonly known as: NORVASC Take 10 mg by mouth daily.   apixaban 5 MG Tabs tablet Commonly known as: ELIQUIS Take 5 mg by mouth 2 (two) times daily.   aspirin EC 81 MG tablet Take 1 tablet (81 mg total) by mouth daily with breakfast. What changed: when to take this   atorvastatin 80 MG tablet Commonly known as: LIPITOR Take 80 mg by mouth at bedtime.   carvedilol 3.125 MG tablet Commonly known as: COREG Take 3.125 mg by mouth 2 (two) times daily with a  meal.   DULoxetine 20 MG capsule Commonly known as: CYMBALTA Take 40 mg by mouth at bedtime.   finasteride 5 MG tablet Commonly known as: PROSCAR Take 5 mg by mouth daily.   isosorbide mononitrate 60 MG 24 hr tablet Commonly known as: IMDUR Take 1 tablet (60 mg total) by mouth 2 (two) times daily. What changed: when to take this   levothyroxine 75 MCG tablet Commonly known as: SYNTHROID Take 75 mcg by mouth daily before breakfast.   metFORMIN 1000 MG tablet Commonly known as: GLUCOPHAGE Take 1,000 mg by mouth daily.   nitroGLYCERIN 0.4 MG SL tablet Commonly known as: NITROSTAT Place 0.4 mg under the tongue every 5 (five) minutes as needed for chest pain.   omeprazole 20 MG capsule Commonly known as: PRILOSEC Take 20 mg by mouth every evening.   oxybutynin 15 MG 24 hr tablet Commonly known as: DITROPAN XL Take 15 mg by mouth at bedtime.   ranolazine 1000 MG SR tablet Commonly known as: RANEXA Take 1 tablet (1,000 mg total) by mouth 2 (two) times daily.   Sol[place]a 100-33 UNT-MCG/ML Sopn Generic drug: Insulin Glargine-Lixisenatide Inject 40 Units into the skin at bedtime.   tamsulosin 0.4 MG Caps capsule Commonly known as: FLOMAX Take 0.4 mg by mouth daily.   terbinafine 1 % cream Commonly known as: LAMISIL Apply 1 application topically 3 (three) times a week.   terbinafine 250 MG tablet Commonly known as: LAMISIL Take 250 mg by mouth daily.   traMADol 50 MG tablet Commonly known as: ULTRAM Take 50 mg by mouth at bedtime.       Major procedures and Radiology Reports - PLEASE review detailed and final reports for all details, in brief -      DG Chest 2 View  Result Date: 12/20/2019 CLINICAL DATA:  64 year old male with shortness of breath. EXAM: CHEST - 2 VIEW COMPARISON:  Chest radiograph dated 07/08/2019. FINDINGS: No focal consolidation, pleural effusion, pneumothorax. There is diffuse interstitial coarsening and minimal left lung base atelectasis.  The cardiac silhouette is within limits. Median sternotomy wires and CABG vascular clips. Atherosclerotic calcification of the aorta. No acute osseous pathology. Degenerative changes of the spine. IMPRESSION: No acute cardiopulmonary process. Electronically Signed   By: Elgie Collard M.D.   On: 12/20/2019 18:51   CT Head Wo Contrast  Result Date: 12/20/2019 CLINICAL DATA:  Ataxia weakness EXAM: CT HEAD WITHOUT CONTRAST TECHNIQUE: Contiguous axial images were obtained from the base of the skull through the vertex without intravenous contrast. COMPARISON:  None. FINDINGS: Brain: No acute territorial infarction, hemorrhage, or intracranial mass. The ventricles are nonenlarged. Mild atrophy. Minimal hypodensity in the white matter likely chronic small vessel ischemic change. Vascular:  No hyperdense vessels. Vertebral and carotid vascular calcification Skull: No fracture. Focal sclerosis in the left frontal bone possible bone island. Sinuses/Orbits: No acute finding. Other: None IMPRESSION: 1. No CT evidence for acute intracranial abnormality. 2. Atrophy and minimal small vessel ischemic changes of the white matter. Electronically Signed   By: Jasmine Pang M.D.   On: 12/20/2019 22:45   CT Angio Chest PE W/Cm &/Or Wo Cm  Result Date: 12/20/2019 CLINICAL DATA:  Shortness of breath with intermittent chest pain EXAM: CT ANGIOGRAPHY CHEST WITH CONTRAST TECHNIQUE: Multidetector CT imaging of the chest was performed using the standard protocol during bolus administration of intravenous contrast. Multiplanar CT image reconstructions and MIPs were obtained to evaluate the vascular anatomy. CONTRAST:  OMNIPAQUE IOHEXOL 350 MG/ML SOLN COMPARISON:  Chest x-ray 12/20/2019 FINDINGS: Cardiovascular: Satisfactory opacification of the pulmonary arteries to the segmental level. No evidence of pulmonary embolism. Nonaneurysmal aorta. Mild aortic atherosclerosis. Post CABG changes. Coronary vascular calcification. No  significant pericardial effusion. Mediastinum/Nodes: No enlarged mediastinal, hilar, or axillary lymph nodes. Thyroid gland, trachea, and esophagus demonstrate no significant findings. Lungs/Pleura: Lungs are clear. No pleural effusion or pneumothorax. Upper Abdomen: No acute abnormality. Musculoskeletal: No chest wall abnormality. No acute or significant osseous findings. Review of the MIP images confirms the above findings. IMPRESSION: 1. Negative for acute pulmonary embolus. Clear lung fields. 2. Aortic atherosclerosis. Aortic Atherosclerosis (ICD10-I70.0). Electronically Signed   By: Jasmine Pang M.D.   On: 12/20/2019 22:42    Micro Results     Recent Results (from the past 240 hour(s))  SARS Coronavirus 2 by RT PCR (hospital order, performed in [facility] hospital lab) Nasopharyngeal Nasopharyngeal Swab     Status: None   Collection Time: 12/20/19 11:48 PM   Specimen: Nasopharyngeal Swab  Result Value Ref Range Status   SARS Coronavirus 2 NEGATIVE NEGATIVE Final    Comment: (NOTE) SARS-CoV-2 target nucleic acids are NOT DETECTED.  The SARS-CoV-2 RNA is generally detectable in upper and lower respiratory specimens during the acute phase of infection. The lowest concentration of SARS-CoV-2 viral copies this assay can detect is 250 copies / mL. A negative result does not preclude SARS-CoV-2 infection and should not be used as the sole basis for treatment or other patient management decisions.  A negative result may occur with improper specimen collection / handling, submission of specimen other than nasopharyngeal swab, presence of viral mutation(s) within the areas targeted by this assay, and inadequate number of viral copies (<250 copies / mL). A negative result must be combined with clinical observations, patient history, and epidemiological information.  Fact Sheet for Patients:   BoilerBrush.com.cy  Fact Sheet for Healthcare  Providers: https://pope.com/  This test is not yet approved or  cleared by the [place] FDA and has been authorized for detection and/or diagnosis of SARS-CoV-2 by FDA under an Emergency Use Authorization (EUA).  This EUA will remain in effect (meaning this test can be used) for the duration of the COVID-19 declaration under Section 564(b)(1) of the Act, 21 U.S.C. section 360bbb-3(b)(1), unless the authorization is terminated or revoked sooner.  Performed at [facility], [address], [place], [place] [address]   MRSA PCR Screening     Status: None   Collection Time: 12/21/19 12:57 AM   Specimen: Nasal Mucosa; Nasopharyngeal  Result Value Ref Range Status   MRSA by PCR NEGATIVE NEGATIVE Final    Comment:        The GeneXpert MRSA Assay (FDA approved for NASAL specimens  only), is one component of a comprehensive MRSA colonization surveillance program. It is not intended to diagnose MRSA infection nor to guide or monitor treatment for MRSA infections. Performed at [facility], [address] [place], [place] [address]        Today   Subjective    Kevin Ballard today has no new complaints -       Correctional officer at bedside, no further chest pain, no shortness of breath   Patient has been seen and examined prior to discharge   Objective   Blood pressure (!) 151/57, pulse 83, temperature 98.3 F (36.8 C), temperature source Oral, resp. rate 20, height 5\' 9"  (1.753 m), weight 114.3 kg, SpO2 96 %.  No intake or output data in the 24 hours ending 12/21/19 1022  Exam Gen:- Awake Alert, no acute distress , obese HEENT:- [place].AT, No sclera icterus Neck-Supple Neck,No JVD,.  Lungs-  CTAB , good air movement bilaterally  CV- S1, S2 normal, regular, prior sternotomy scar Abd-  +ve B.Sounds, Abd Soft, No tenderness,    Extremity/Skin:- No  edema,   good pulses Psych-affect is appropriate, oriented x3 Neuro-no new focal deficits, no tremors     Data Review   CBC w Diff:  Lab Results  Component Value Date   WBC 6.6 12/21/2019   HGB 13.8 12/21/2019   HCT 42.0 12/21/2019   PLT 239 12/21/2019   LYMPHOPCT 24 12/20/2019   MONOPCT 10 12/20/2019   EOSPCT 0 12/20/2019   BASOPCT 0 12/20/2019    CMP:  Lab Results  Component Value Date   NA 136 12/21/2019   K 3.5 12/21/2019   CL 103 12/21/2019   CO2 23 12/21/2019   BUN 18 12/21/2019   CREATININE 1.12 12/21/2019   PROT 6.5 12/21/2019   ALBUMIN 3.9 12/21/2019   BILITOT 0.7 12/21/2019   ALKPHOS 52 12/21/2019   AST 15 12/21/2019   ALT 18 12/21/2019  .   Total Discharge time is about 33 minutes  02/21/2020 M.D on 12/21/2019 at 10:22 AM  Go to [email or web address] -  for contact info  [facility] - Office  [phone]

## 2019-12-21 NOTE — Consult Note (Addendum)
Cardiology Consult    Patient ID: Kevin Ballard; [id number]; [birth year]   Admit date: 12/20/2019 Date of Consult: 12/21/2019  Primary Care Provider: Patient, No Pcp Per Primary Cardiologist: Previously evaluated by Dr. [place] in 12/2017; Evaluated by Dr. Algie Coffer during admission in 06/2019  Patient Profile    Kevin Ballard is a 64 y.o. male with past medical history of CAD (s/p CABG in 2016 with LIMA-LAD, SVG-D1, and SVG-OM1, graft failure and s/p stenting of SVG-D1, cath in 12/2017 with patent LIMA-LAD, patent SVG-OM1 and patent SVG-D1 with 50% ISR of previously placed stent with chronic small vessel disease noted), paroxysmal atrial fibrillation, HTN, HLD and IDDM who is being seen today for the evaluation of chest pain at the request of Dr. Thomes Dinning.   History of Present Illness    Kevin Ballard was most recently admitted to [facility] in 06/2019 for evaluation of chest pain. HS Troponin values were negative. Echocardiogram showed a preserved EF of 60-65% with no regional WMA. Was noted to have severe LVH and Grade 2 DD with trivial TR. He was followed by Dr. Algie Coffer and underwent stress testing which showed no clear evidence for reversibile ischemia and was a low-risk study. Imdur was increased from 60mg  to 120mg  daily.   He presented to [facility] ED on 12/20/2019 for evaluation of chest pain and dyspnea. In talking with the patient today, he reports not feeling back to baseline since being diagnosed with COVID-19 last year. He feels more fatiuued and reports dyspnea on exertion. He noticed his dyspnea more when walking to the dining hall at [facility] and denies any specific orthopnea or PND. Over the past week, he has experienced episodes of chest pain at night which resolve with SL NTG x1. No exertional chest pain. Reports his Coreg was reduced several months ago due to hypotension. Says he divides out his Imdur to BID dosing due to headaches but is unsure if he is taking 30 mg BID or 60mg  BID. Reports  worsening dizziness over the past month which started after he had pain along his head for several seconds. No associated slurred speech or vision changes. His dizziness is worse with positional changes and also worse when he closes his eyes. Scheduled to see [facility] later this week.   Initial labs show WBC 6.8, Hgb 14.1, platelets 244, Na+ 136, K+ 4.3 and creatinine 1.29. Initial and delta HS Troponin values have been negative at 8. CXR showed no acute cardiopulmonary abnormalities. CT Head without acute intracranial abnormalities. CTA Chest showed aortic atherosclerosis but was negative for a PE. EKG shows NSR, HR 91 with LAFB and inferior infarct pattern but no acute ST abnormalities when compared to prior tracings.    Past Medical History:  Diagnosis Date  . Atrial fibrillation (HCC)   . BPH (benign prostatic hyperplasia)   . Coronary artery disease   . Coronary artery disease involving coronary bypass graft   . Diabetes mellitus without complication (HCC)   . GERD (gastroesophageal reflux disease)   . Hypercholesterolemia   . Hypertension   . Myocardial infarct [facility])     Past Surgical History:  Procedure Laterality Date  . LEFT HEART CATH AND CORS/GRAFTS ANGIOGRAPHY N/A 12/21/2017   Procedure: LEFT HEART CATH AND CORS/GRAFTS ANGIOGRAPHY;  Surgeon: [facility], Peter M, MD;  Location: [facility] INVASIVE CV LAB;  Service: Cardiovascular;  Laterality: N/A;  . LUMBAR FUSION    . MEDIAL COLLATERAL LIGAMENT REPAIR, KNEE    . TIBIAL FASCIOTOMY    .  tibiastomy       Home Medications:  Prior to Admission medications   Medication Sig Start Date End Date Taking? Authorizing Provider  acetaminophen (TYLENOL) 325 MG tablet Take 2 tablets (650 mg total) by mouth every 4 (four) hours as needed for headache or mild pain. 12/22/17  Yes Lonia Blood, MD  amLODipine (NORVASC) 10 MG tablet Take 10 mg by mouth daily.   Yes [provider]  apixaban (ELIQUIS) 5 MG TABS tablet Take 5 mg by mouth  2 (two) times daily.   Yes [provider]  aspirin EC 81 MG tablet Take 81 mg by mouth daily.   Yes [provider]  atorvastatin (LIPITOR) 80 MG tablet Take 80 mg by mouth at bedtime.    Yes [provider]  carvedilol (COREG) 3.125 MG tablet Take 3.125 mg by mouth 2 (two) times daily with a meal.   Yes [provider]  DULoxetine (CYMBALTA) 20 MG capsule Take 40 mg by mouth at bedtime.   Yes [provider]  finasteride (PROSCAR) 5 MG tablet Take 5 mg by mouth daily.   Yes [provider]  Insulin Glargine-Lixisenatide (SOLIQUA) 100-33 UNT-MCG/ML SOPN Inject 40 Units into the skin at bedtime.   Yes [provider]  isosorbide mononitrate (IMDUR) 60 MG 24 hr tablet Take 60 mg by mouth daily.   Yes [provider]  levothyroxine (SYNTHROID) 75 MCG tablet Take 75 mcg by mouth daily before breakfast.   Yes [provider]  metFORMIN (GLUCOPHAGE) 1000 MG tablet Take 1,000 mg by mouth daily.   Yes [provider]  nitroGLYCERIN (NITROSTAT) 0.4 MG SL tablet Place 0.4 mg under the tongue every 5 (five) minutes as needed for chest pain.    Yes [provider]  omeprazole (PRILOSEC) 20 MG capsule Take 20 mg by mouth every evening.    Yes [provider]  oxybutynin (DITROPAN XL) 15 MG 24 hr tablet Take 15 mg by mouth at bedtime.   Yes [provider]  ranolazine (RANEXA) 1000 MG SR tablet Take 1 tablet (1,000 mg total) by mouth 2 (two) times daily. 12/22/17  Yes Lonia Blood, MD  tamsulosin (FLOMAX) 0.4 MG CAPS capsule Take 0.4 mg by mouth daily.   Yes [provider]  terbinafine (LAMISIL) 1 % cream Apply 1 application topically 3 (three) times a week.   Yes [provider]  terbinafine (LAMISIL) 250 MG tablet Take 250 mg by mouth daily.   Yes [provider]  traMADol (ULTRAM) 50 MG tablet Take 50 mg by mouth at bedtime.   Yes [provider]    insulin aspart (NOVOLOG) 100 UNIT/ML injection Inject 0-15 Units into the skin 3 (three) times daily with meals. Patient not taking: Reported on 12/20/2019 07/11/19   Orpah Cobb, MD    Inpatient Medications: Scheduled Meds: . amLODipine  10 mg Oral Daily  . apixaban  5 mg Oral BID  . aspirin EC  81 mg Oral Daily  . atorvastatin  80 mg Oral QHS  . carvedilol  3.125 mg Oral BID WC  . feeding supplement (ENSURE ENLIVE)  237 mL Oral BID BM  . finasteride  5 mg Oral Daily  . insulin aspart  0-5 Units Subcutaneous QHS  . insulin aspart  0-9 Units Subcutaneous TID WC  . isosorbide mononitrate  60 mg Oral BID  . levothyroxine  75 mcg Oral Q0600  . oxybutynin  15 mg Oral QHS  . pantoprazole  40 mg Oral Daily  . ranolazine  1,000 mg Oral BID  . tamsulosin  0.4 mg Oral Daily   Continuous Infusions:  PRN Meds:   Allergies:    Allergies  Allergen Reactions  . Brilinta [Ticagrelor] Shortness Of Breath    Social History:   Social History   Socioeconomic History  . Marital status: Single    Spouse name: Not on file  . Number of children: Not on file  . Years of education: Not on file  . Highest education level: Not on file  Occupational History  . Not on file  Tobacco Use  . Smoking status: Never Smoker  . Smokeless tobacco: Never Used  Vaping Use  . Vaping Use: Never used  Substance and Sexual Activity  . Alcohol use: Never  . Drug use: Never  . Sexual activity: Not on file  Other Topics Concern  . Not on file  Social History Narrative  . Not on file   Social Determinants of Health   Financial Resource Strain:   . Difficulty of Paying Living Expenses:   Food Insecurity:   . Worried About Programme researcher, broadcasting/film/video in the Last Year:   . Barista in the Last Year:   Transportation Needs:   . Freight forwarder (Medical):   Marland Kitchen Lack of Transportation (Non-Medical):   Physical Activity:   . Days of Exercise per Week:   . Minutes of Exercise per Session:    Stress:   . Feeling of Stress :   Social Connections:   . Frequency of Communication with Friends and Family:   . Frequency of Social Gatherings with Friends and Family:   . Attends Religious Services:   . Active Member of Clubs or Organizations:   . Attends Banker Meetings:   Marland Kitchen Marital Status:   Intimate Partner Violence:   . Fear of Current or Ex-Partner:   . Emotionally Abused:   Marland Kitchen Physically Abused:   . Sexually Abused:      Family History:    Family History  Problem Relation Age of Onset  . Heart attack Mother   . Cancer Mother   . Heart attack Maternal Grandmother       Review of Systems    General:  No chills, fever, night sweats or weight changes.  Cardiovascular:  No edema, orthopnea, palpitations, paroxysmal nocturnal dyspnea. Positive for chest pain and dyspnea on exertion.  Dermatological: No rash, lesions/masses Respiratory: No cough, dyspnea Urologic: No hematuria, dysuria Abdominal:   No nausea, vomiting, diarrhea, bright red blood per rectum, melena, or hematemesis Neurologic:  No visual changes, wkns, changes in mental status. Positive for headaches and dizziness.   All other systems reviewed and are otherwise negative except as noted above.  Physical Exam/Data    Vitals:   12/20/19 2359 12/21/19 0000 12/21/19 0031 12/21/19 0517  BP:  (!) 153/62 (!) 157/71 (!) 151/57  Pulse: 82 82 84 83  Resp:   20 20  Temp:   98.2 F (36.8 C) 98.3 F (36.8 C)  TempSrc:   Oral Oral  SpO2: 95% 95% 99% 96%  Weight:      Height:       No intake or output data in the 24 hours ending 12/21/19 1020 Filed Weights   12/20/19 1805  Weight: 114.3 kg   Body mass index is 37.21 kg/m.   General: Pleasant male appearing in NAD Psych: Normal affect. Neuro: Alert and oriented X 3. Moves all  extremities spontaneously. HEENT: Normal  Neck: Supple without bruits or JVD. Lungs:  Resp regular and unlabored, CTA without wheezing or rales. Heart: RRR no s3,  s4, or murmurs. Abdomen: Soft, non-tender, non-distended, BS + x 4.  Extremities: No clubbing or cyanosis. Trace lower extremity edema. DP/PT/Radials 2+ and equal bilaterally. Cuffs in place.    EKG:  The EKG was personally reviewed and demonstrates: NSR, HR 91 with LAFB and inferior infarct pattern but no acute ST abnormalities when compared to prior tracings.   Telemetry:  Telemetry was personally reviewed and demonstrates: NSR, HR in 70's to 80's with occasional PVC's. Sometimes in a bigeminal pattern.    Labs/Studies     Relevant CV Studies:  Cardiac Catheterization: 12/2017  RPDA-1 lesion is 50% stenosed.  RPDA-2 lesion is 70% stenosed.  Mid RCA lesion is 35% stenosed.  Prox Cx to Mid Cx lesion is 70% stenosed.  Ost 1st Mrg lesion is 85% stenosed.  Ost 1st Diag lesion is 90% stenosed.  Prox LAD lesion is 70% stenosed.  Prox LAD to Mid LAD lesion is 100% stenosed.  Prox Graft lesion is 50% stenosed.  SVG graft was visualized by angiography and is normal in caliber.  SVG graft was visualized by angiography and is very large.  The graft exhibits no disease.  LIMA graft was visualized by angiography and is normal in caliber.  The graft exhibits no disease.  The left ventricular systolic function is normal.  LV end diastolic pressure is mildly elevated.  The left ventricular ejection fraction is 55-65% by visual estimate.   1. Severe 2 vessel obstructive CAD.     - 100% mid LAD    - 90% first diagonal    - 70% mid LCx    - 85% OM 1    - there is a high RV marginal branch with 70% stenosis.    - 70% distal PDA 2. Patent LIMA to the LAD 3. Patent SVG to the diagonal. The SVG has a stent proximally with a focal 50% stenosis 4. Patent SVG to the OM 1 5. Normal LV function 6. Normal LVEDP  Plan: I do not see anything angiographically that would explain his prolonged resting chest pain. He does have chronic small vessel disease. His grafts are patent. Would  recommend continued medical therapy. Will discontinue IV Ntg and resume po. Titrate antianginal therapy as BP allows. Can resume Eliquis in am.    Echocardiogram: 06/2019 IMPRESSIONS    1. Left ventricular ejection fraction, by visual estimation, is 60 to  65%. The left ventricle has normal function. There is severely increased  left ventricular hypertrophy.  2. Elevated left atrial pressure.  3. Left ventricular diastolic parameters are consistent with Grade II  diastolic dysfunction (pseudonormalization).  4. The left ventricle has no regional wall motion abnormalities.  5. Global right ventricle has normal systolic function.The right  ventricular size is normal.  6. Left atrial size was normal.  7. Right atrial size was normal.  8. Mild mitral annular calcification.  9. The mitral valve is normal in structure. No evidence of mitral valve  regurgitation. No evidence of mitral stenosis.  10. The tricuspid valve is normal in structure. Tricuspid valve  regurgitation is trivial.  11. The aortic valve is tricuspid. Aortic valve regurgitation is not  visualized. Mild aortic valve sclerosis without stenosis.  12. The pulmonic valve was normal in structure. Pulmonic valve  regurgitation is not visualized.  13. The inferior vena cava is normal in size  with greater than 50%  respiratory variability, suggesting right atrial pressure of 3 mmHg.  14. Normal LV systolic function; severe LVH; grade 2 diastolic  dysfunction.   NST: 06/2019 IMPRESSION: 1. No clear evidence for reversibility ischemia. Perfusion in the anteroseptal wall near the basal segment is equivocal but suspect artifact and technical issues in this area.  2. Normal left ventricular wall motion except for the anteroseptal wall which may be related to technical issues or artifact.  3. Left ventricular ejection fraction 52%  4. Non invasive risk stratification*: Low  Laboratory Data:  Chemistry Recent  Labs  Lab 12/20/19 1855 12/21/19 0422  NA 136 136  K 4.3 3.5  CL 104 103  CO2 20* 23  GLUCOSE 85 147*  BUN 20 18  CREATININE 1.29* 1.12  CALCIUM 9.1 8.9  GFRNONAA 58* >60  GFRAA >60 >60  ANIONGAP 12 10    Recent Labs  Lab 12/21/19 0422  PROT 6.5  ALBUMIN 3.9  AST 15  ALT 18  ALKPHOS 52  BILITOT 0.7   Hematology Recent Labs  Lab 12/20/19 1855 12/21/19 0422  WBC 6.8 6.6  RBC 4.55 4.50  HGB 14.1 13.8  HCT 42.6 42.0  MCV 93.6 93.3  MCH 31.0 30.7  MCHC 33.1 32.9  RDW 13.8 13.5  PLT 244 239   Cardiac EnzymesNo results for input(s): TROPONINI in the last 168 hours. No results for input(s): TROPIPOC in the last 168 hours.  BNPNo results for input(s): BNP, PROBNP in the last 168 hours.  DDimer No results for input(s): DDIMER in the last 168 hours.  Radiology/Studies:  DG Chest 2 View  Result Date: 12/20/2019 CLINICAL DATA:  64 year old male with shortness of breath. EXAM: CHEST - 2 VIEW COMPARISON:  Chest radiograph dated 07/08/2019. FINDINGS: No focal consolidation, pleural effusion, pneumothorax. There is diffuse interstitial coarsening and minimal left lung base atelectasis. The cardiac silhouette is within limits. Median sternotomy wires and CABG vascular clips. Atherosclerotic calcification of the aorta. No acute osseous pathology. Degenerative changes of the spine. IMPRESSION: No acute cardiopulmonary process. Electronically Signed   By: Elgie Collard M.D.   On: 12/20/2019 18:51   CT Head Wo Contrast  Result Date: 12/20/2019 CLINICAL DATA:  Ataxia weakness EXAM: CT HEAD WITHOUT CONTRAST TECHNIQUE: Contiguous axial images were obtained from the base of the skull through the vertex without intravenous contrast. COMPARISON:  None. FINDINGS: Brain: No acute territorial infarction, hemorrhage, or intracranial mass. The ventricles are nonenlarged. Mild atrophy. Minimal hypodensity in the white matter likely chronic small vessel ischemic change. Vascular: No hyperdense  vessels. Vertebral and carotid vascular calcification Skull: No fracture. Focal sclerosis in the left frontal bone possible bone island. Sinuses/Orbits: No acute finding. Other: None IMPRESSION: 1. No CT evidence for acute intracranial abnormality. 2. Atrophy and minimal small vessel ischemic changes of the white matter. Electronically Signed   By: Jasmine Pang M.D.   On: 12/20/2019 22:45   CT Angio Chest PE W/Cm &/Or Wo Cm  Result Date: 12/20/2019 CLINICAL DATA:  Shortness of breath with intermittent chest pain EXAM: CT ANGIOGRAPHY CHEST WITH CONTRAST TECHNIQUE: Multidetector CT imaging of the chest was performed using the standard protocol during bolus administration of intravenous contrast. Multiplanar CT image reconstructions and MIPs were obtained to evaluate the vascular anatomy. CONTRAST:  OMNIPAQUE IOHEXOL 350 MG/ML SOLN COMPARISON:  Chest x-ray 12/20/2019 FINDINGS: Cardiovascular: Satisfactory opacification of the pulmonary arteries to the segmental level. No evidence of pulmonary embolism. Nonaneurysmal aorta. Mild aortic atherosclerosis. Post CABG  changes. Coronary vascular calcification. No significant pericardial effusion. Mediastinum/Nodes: No enlarged mediastinal, hilar, or axillary lymph nodes. Thyroid gland, trachea, and esophagus demonstrate no significant findings. Lungs/Pleura: Lungs are clear. No pleural effusion or pneumothorax. Upper Abdomen: No acute abnormality. Musculoskeletal: No chest wall abnormality. No acute or significant osseous findings. Review of the MIP images confirms the above findings. IMPRESSION: 1. Negative for acute pulmonary embolus. Clear lung fields. 2. Aortic atherosclerosis. Aortic Atherosclerosis (ICD10-I70.0). Electronically Signed   By: Jasmine Pang M.D.   On: 12/20/2019 22:42     Assessment & Plan    1. Chest Pain with Atypical Features - He describes episodes of chest pain which have occurred at night and resolve with SL NTG x1. Denies any  exertional chest pain but does report having dyspnea on exertion since his COVID-19 diagnosis in 02/2019. - HS Troponin values have been negative and his EKG shows no acute ischemic changes. Echocardiogram and NST earlier this year were reassuring as outlined above.  - Would recommend focusing on medical therapy for now given his recent workup. He is already on ASA, Atorvastatin 80mg  daily, Amlodipine 10mg  daily, Ranexa 1000mg  BID and Coreg 3.125mg  BID (dosing reduced 2 months ago due to soft BP). He is listed as being on Imdur 60mg  daily but this was titrated to 120mg  daily during his admission last year but he is unsure of his current dose. Will ask Pharmacy to verify with [facility]. He does take this as BID dosing due to headaches. If taking 30mg  BID, would titrate to 60mg  BID. If taking 60mg  BID, would go to 90mg  BID.   2. CAD -  He is s/p CABG in 2016 with LIMA-LAD, SVG-D1, and SVG-OM1, graft failure and s/p stenting of SVG-D1 with cath in 12/2017 showing patent LIMA-LAD, patent SVG-OM1 and patent SVG-D1 with 50% ISR of previously placed stent with chronic small vessel disease noted.  - Continue ASA, Imdur, BB, Ranexa and statin therapy.   3. Paroxysmal Atrial Fibrillation - He has maintained NSR this admission. Remains on Coreg 3.125mg  BID for rate-control. He denies any evidence of active bleeding. Continue Eliquis 5mg  BID for anticoagulation.   4. HTN - BP has been variable at 134/34 - 160/71 within the past 24 hours. Scheduled to receive his AM medications now. Continue to follow.   5. HLD - Followed by PCP. He remains on Atorvastatin 80mg  daily with goal LDL less than 70.  6. Dizziness - He reports progressive symptoms over the past month. CT Head on admission was without acute intracranial abnormalities. Will ask for orthostatics to be obtained. He does have scheduled follow-up with [facility] later this week.     For questions or updates, please contact [facility] Please  consult [email or web address] for contact info under Cardiology/STEMI.  Signed, , PA-C 12/21/2019, 10:20 AM Pager: [phone]   Attending note:  Patient seen and examined.  Records reviewed and case discussed with Ms. PA-C embolus hospitalist service.  Kevin Ballard is a prisoner at [facility] facility with a known history of multivessel CAD status post CABG with subsequent stent intervention to the SVG to first diagonal, paroxysmal atrial fibrillation, hypertension, hyperlipidemia, and type 2 diabetes mellitus.  Last cardiology evaluation by was by Dr. 2017 earlier in the year.  He presents to the hospital for evaluation of recent chest pain episodes, mainly seem to be nocturnal, not necessarily exertional however.  Medical regimen reviewed and fairly reasonable from the perspective of ischemic heart disease and antianginal effect.  On examination he appears comfortable, no active symptoms.  Heart rate in the 80s and in sinus rhythm by telemetry, systolic blood pressure in 150s.  Lungs exhibit decreased breath sounds, no crackles.  Cardiac exam with RRR no gallop.  Pertinent lab work includes potassium 3.5, creatinine 1.12, normal AST and ALT, hemoglobin 13.8, high-sensitivity troponin I levels of 8x2 not consistent with ACS.  I personally reviewed his ECG which shows sinus rhythm, rule out old inferior infarct pattern, no acute ST-T wave changes compared to prior tracing.  Chest CTA reports no pulmonary embolism, clear lung fields, aortic atherosclerosis and changes of prior CABG noted.  Recommendation is for continued medical therapy from the perspective of ischemic heart disease.  He does have substrate for angina particularly in light of chronic small vessel disease by last catheterization.  In the absence of ACS would hold off on follow-up angiography at this time unless his symptoms escalate in the interim.  Would continue aspirin, Lipitor, Norvasc, Ranexa, Coreg, and Imdur.   Nitrate was increased by last assessment per Dr. Algie CofferKadakia, however not clear that this dose adjustment has been made as yet.  He otherwise remains on Eliquis for stroke prophylaxis with PAF.  Currently in sinus rhythm.  Jonelle SidleSamuel G. Jermario Kalmar, M.D., F.A.C.C.

## 2020-10-01 IMAGING — DX DG CHEST 2V
2 series · 2 of 2 positions shown · non-contrast
Comparison: Chest radiograph dated 07/08/2019.

CLINICAL DATA: 64-year-old male with shortness of breath.

EXAM:
CHEST - 2 VIEW

[chest pa]
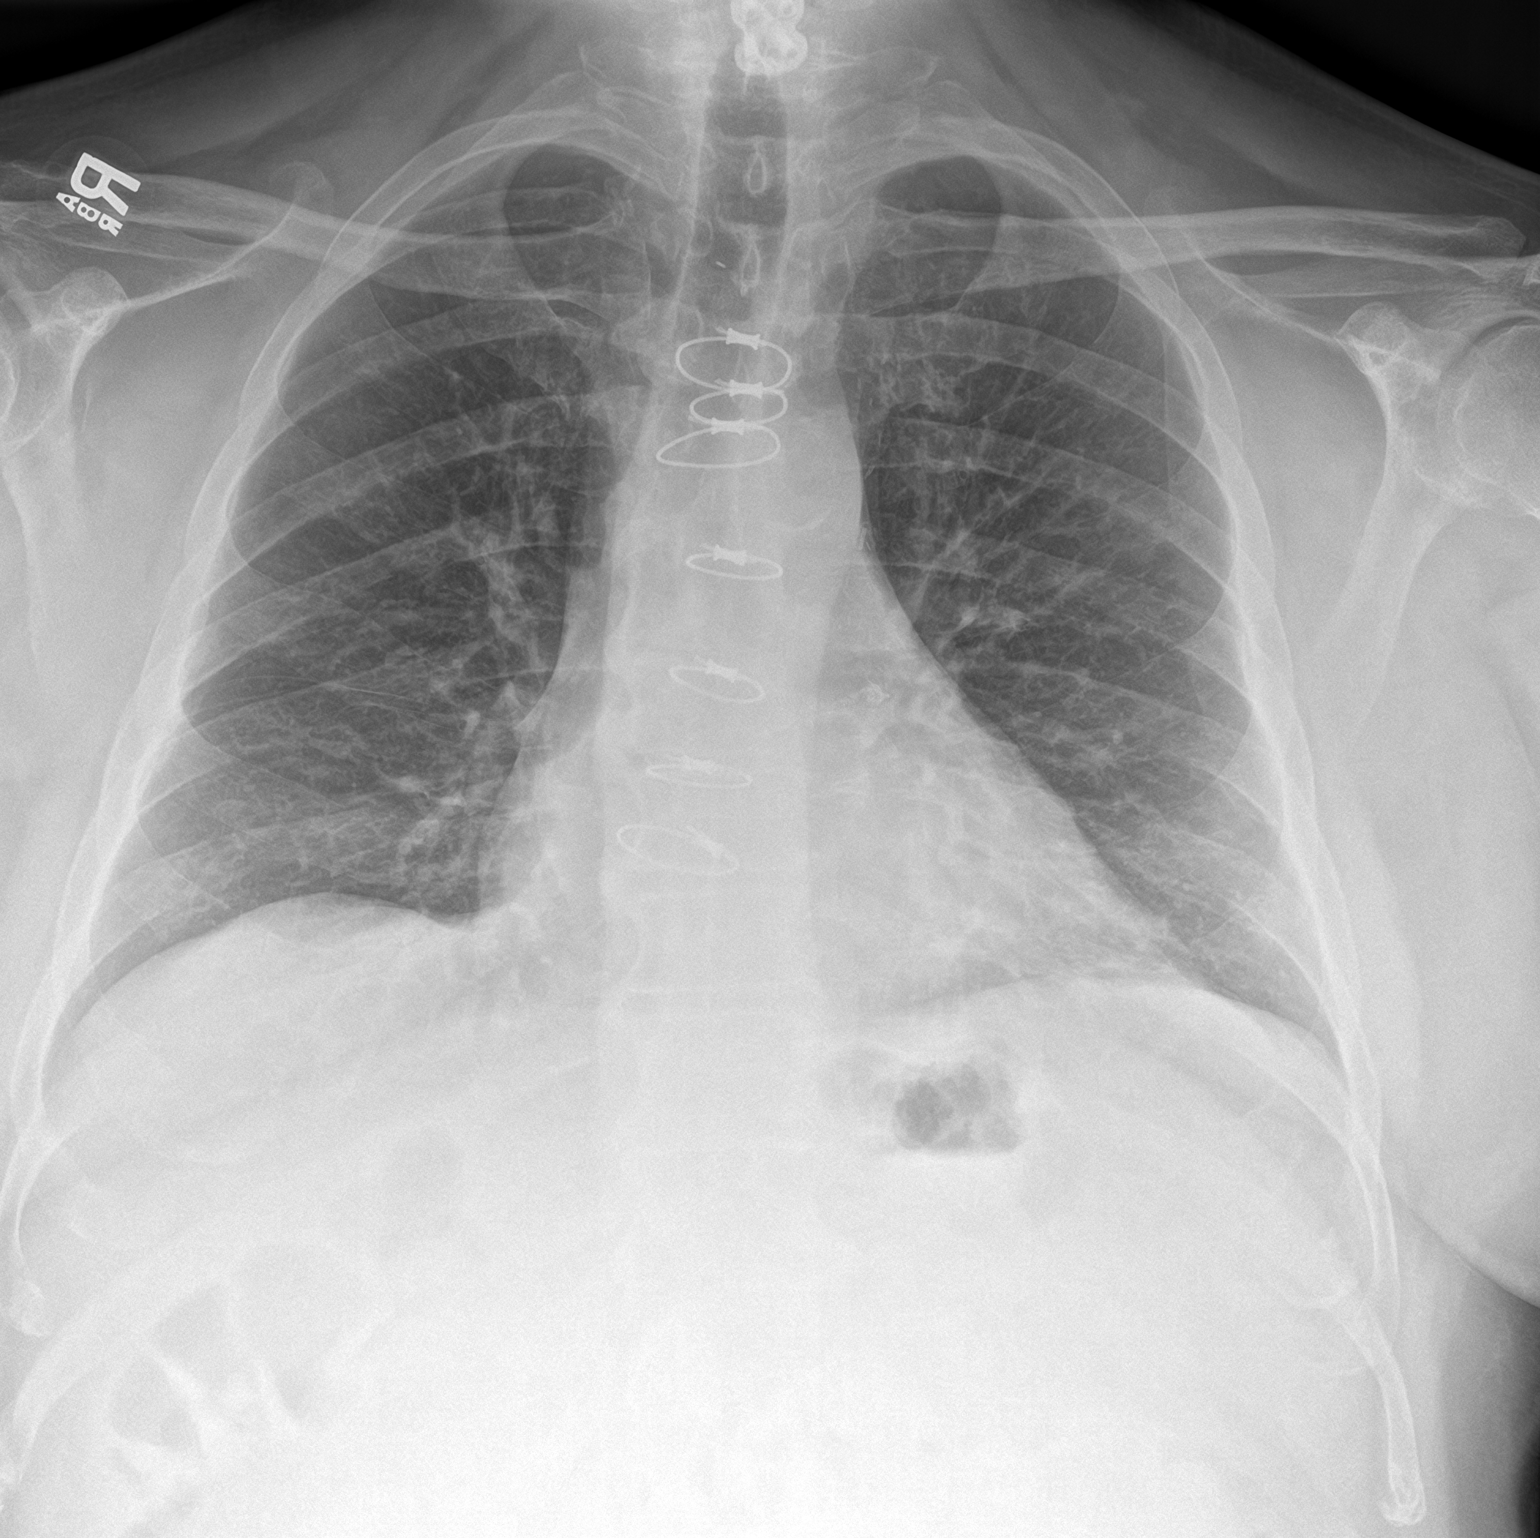

[chest lat]
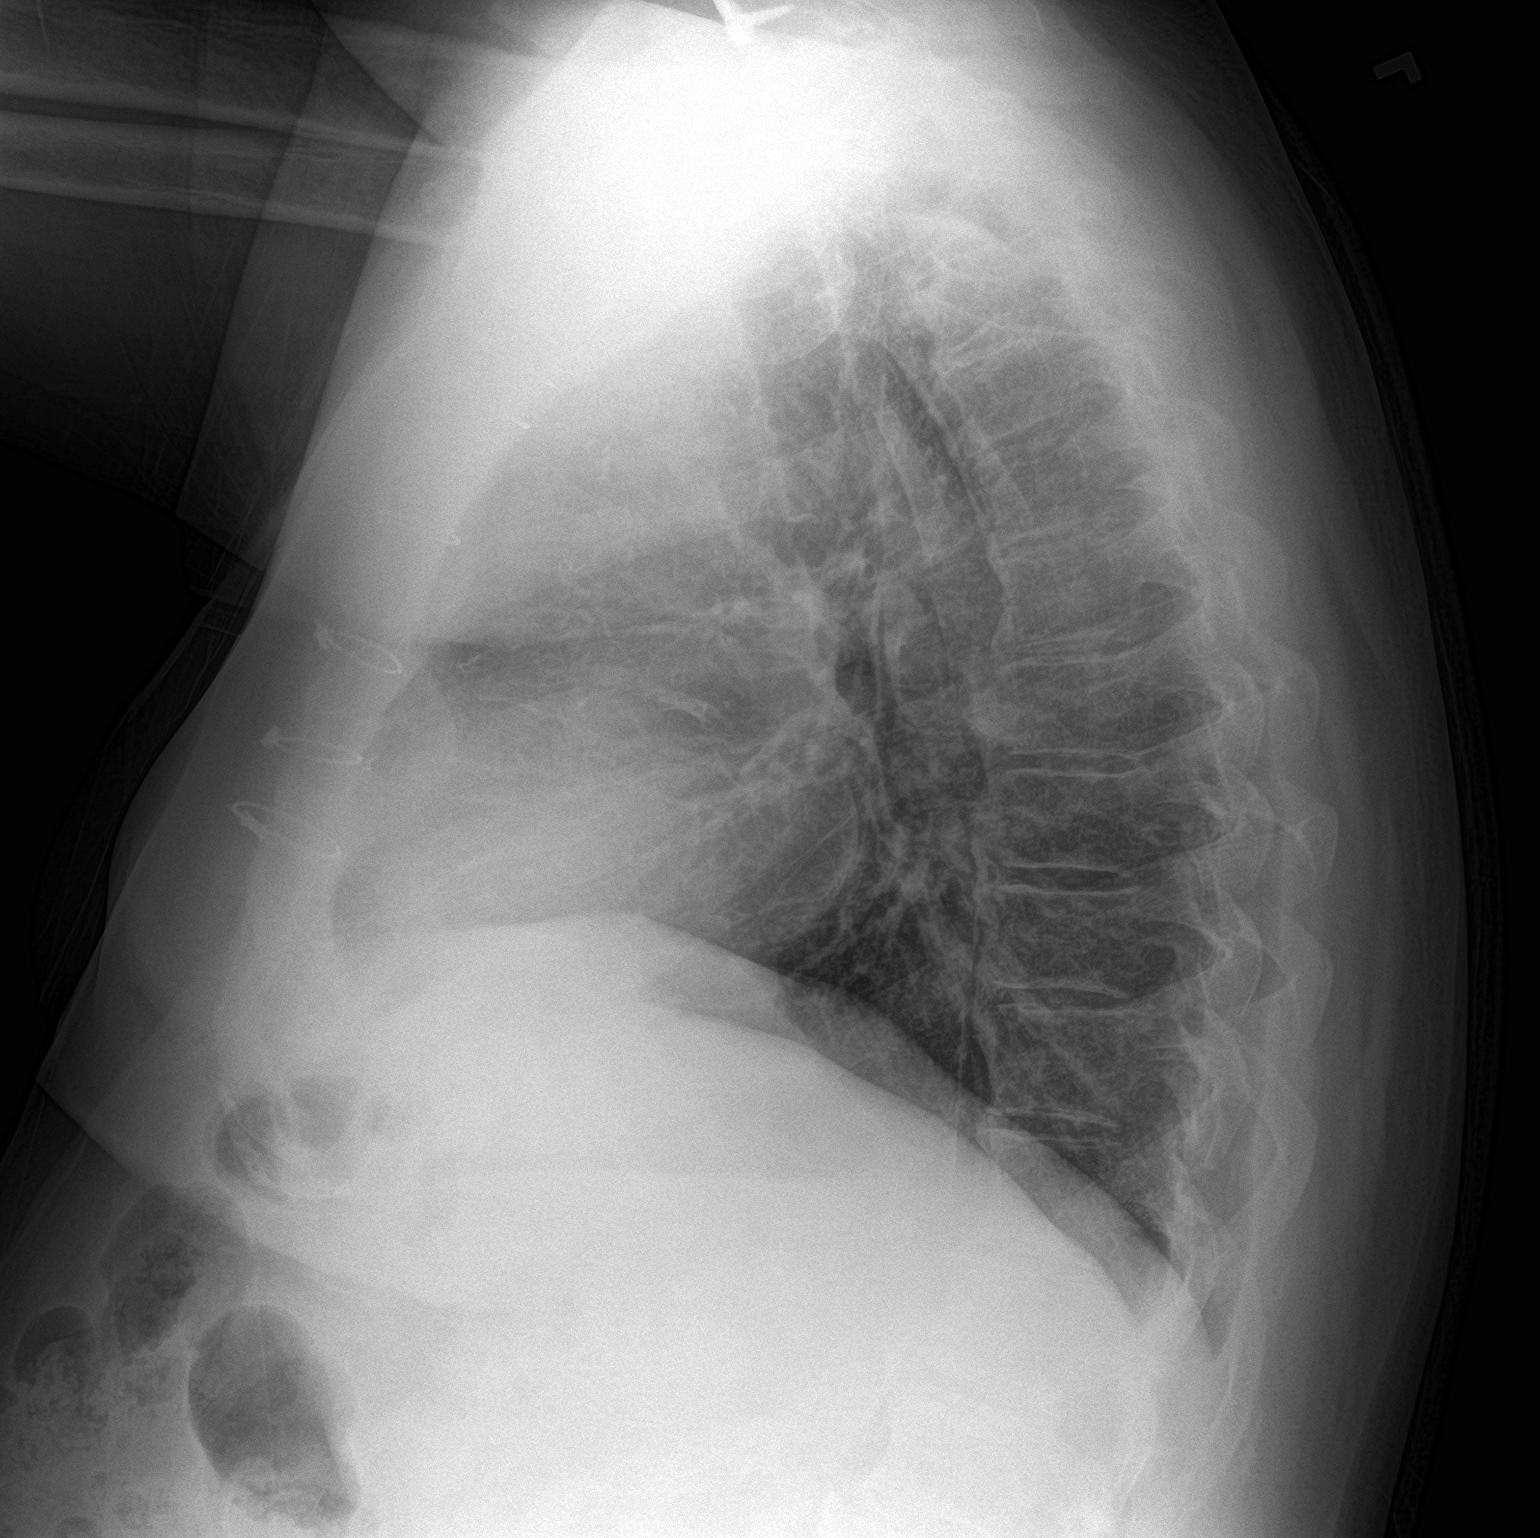

[2 of 2 positions shown; findings below may reference images not displayed]

FINDINGS: No focal consolidation, pleural effusion, pneumothorax. There is
diffuse interstitial coarsening and minimal left lung base
atelectasis. The cardiac silhouette is within limits. Median
sternotomy wires and CABG vascular clips. Atherosclerotic
calcification of the aorta. No acute osseous pathology. Degenerative
changes of the spine.
IMPRESSION: No acute cardiopulmonary process.

## 2020-10-01 IMAGING — CT CT HEAD W/O CM
3 series · 15 of 47 positions shown, 18 images · non-contrast
Comparison: None.

CLINICAL DATA: Ataxia weakness

EXAM:
CT HEAD WITHOUT CONTRAST
TECHNIQUE: Contiguous axial images were obtained from the base of the skull
through the vertex without intravenous contrast.

[Series 2: head w o · axial · 0.47mm/px · z∈[+29,+154]mm · 9 of 31 slices shown, 12 images]
[im 3/31  brain]
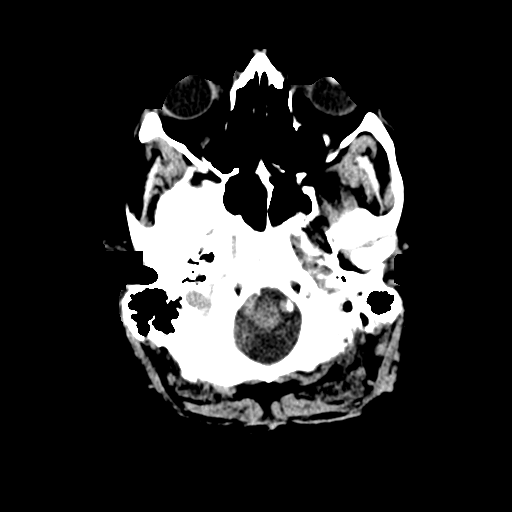
[im 3/31  bone]
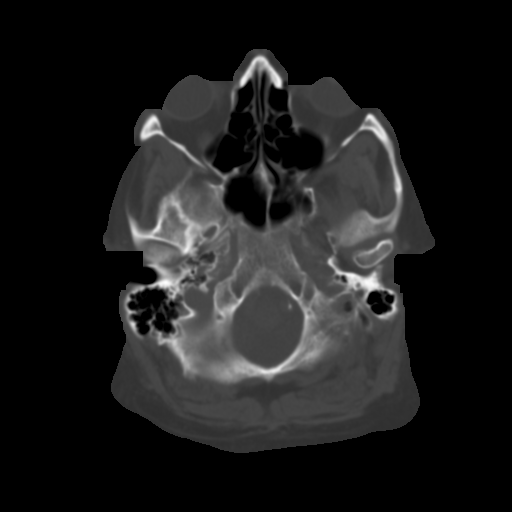
[im 6/31  brain]
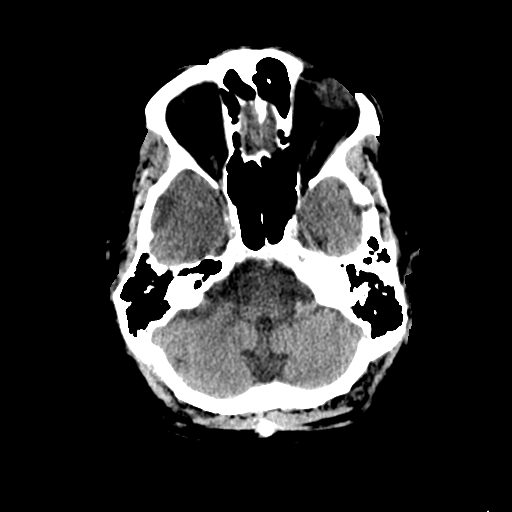
[im 9/31  brain]
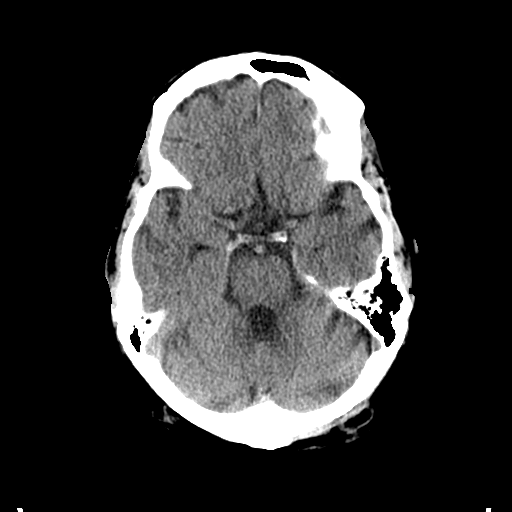
[im 12/31  brain]
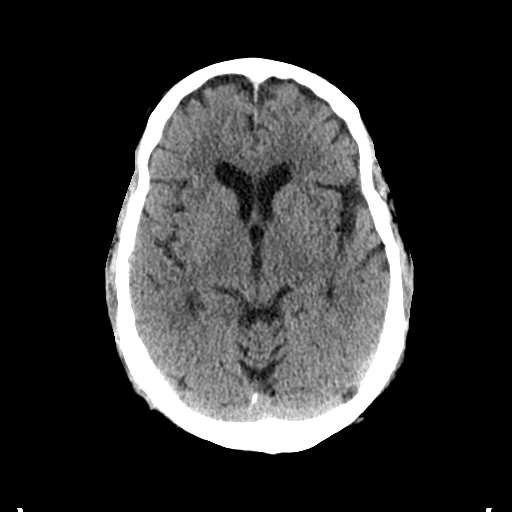
[im 16/31  brain]
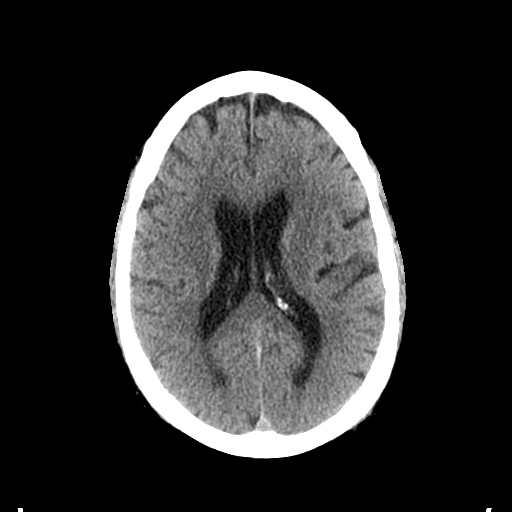
[im 16/31  bone]
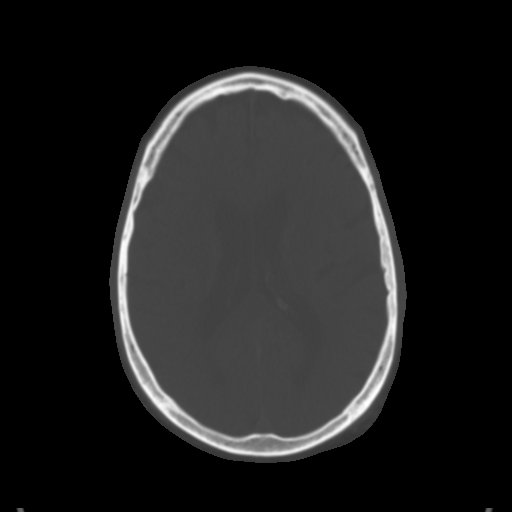
[im 19/31  brain]
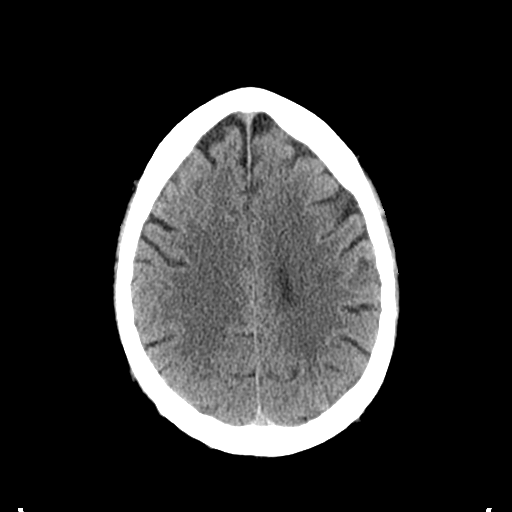
[im 22/31  brain]
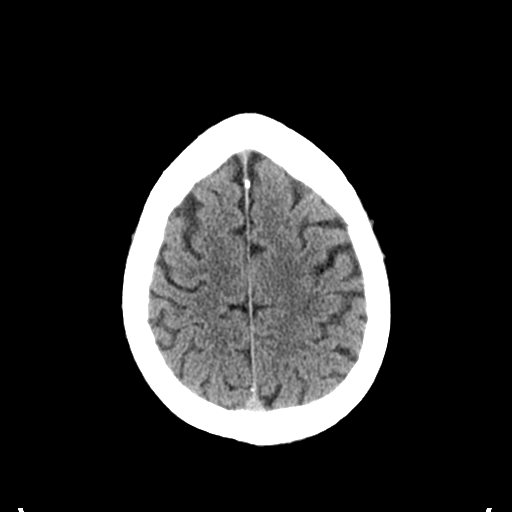
[im 25/31  brain]
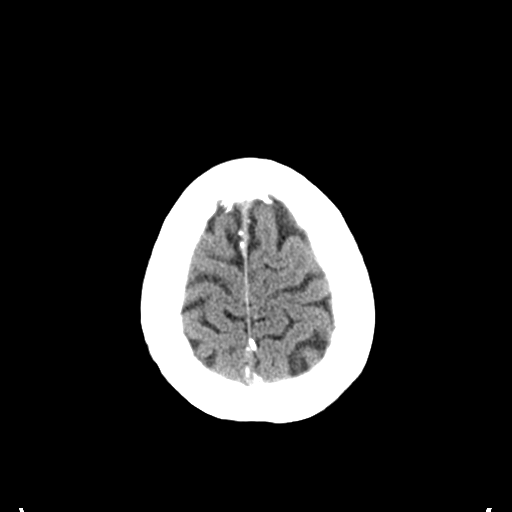
[im 28/31  brain]
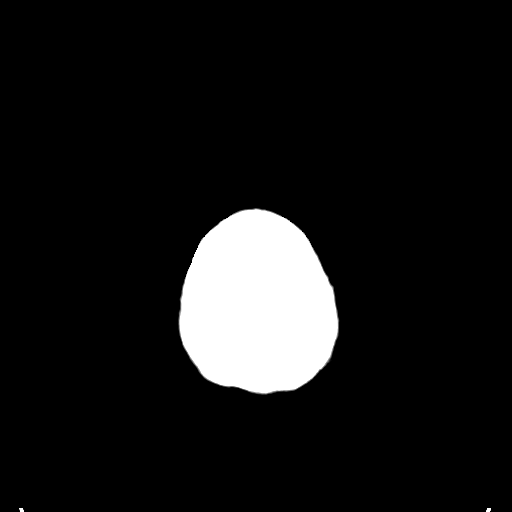
[im 28/31  bone]
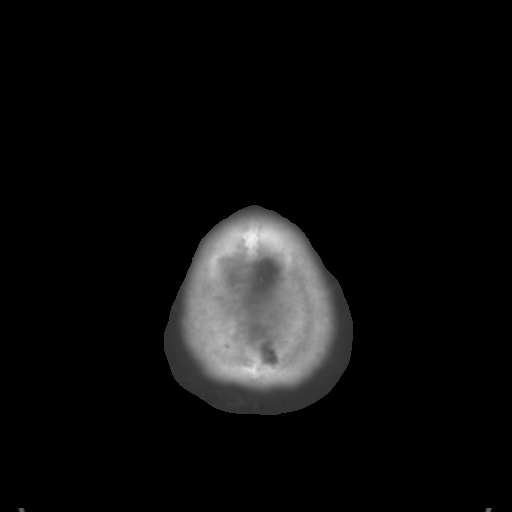

[Series 4: coronal soft · coronal · 0.31mm/px · 3 of 68 slices shown]
[im 30/68  brain]
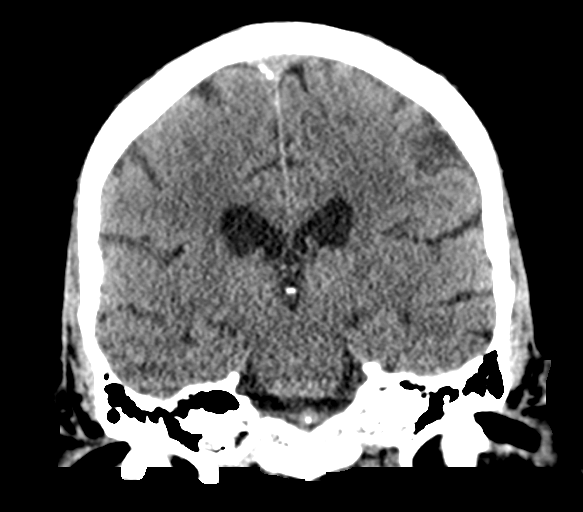
[im 38/68  brain]
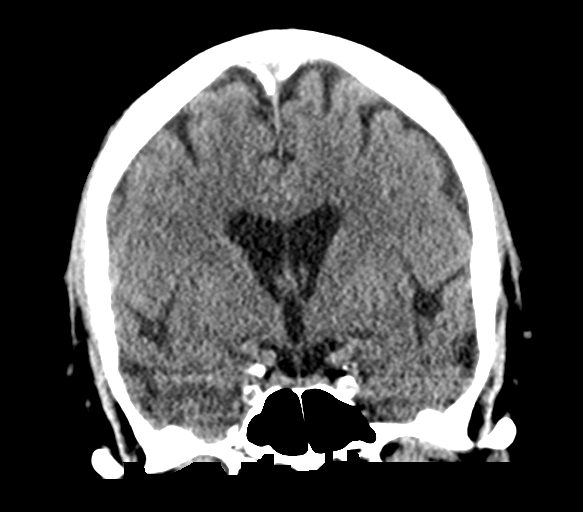
[im 45/68  brain]
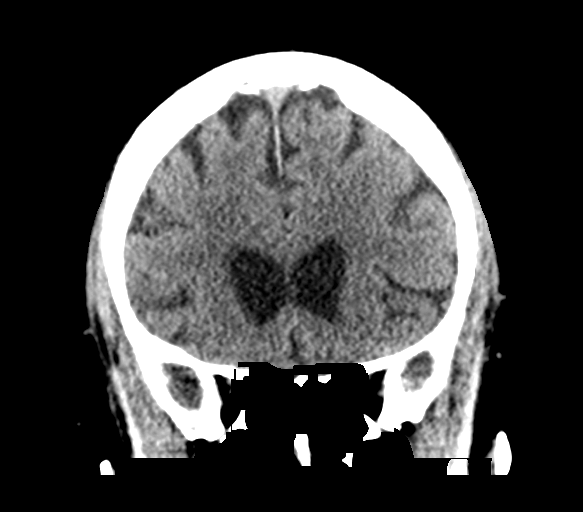

[Series 5: sagittal soft · sagittal · 0.31mm/px · 3 of 52 slices shown]
[im 18/52  brain]
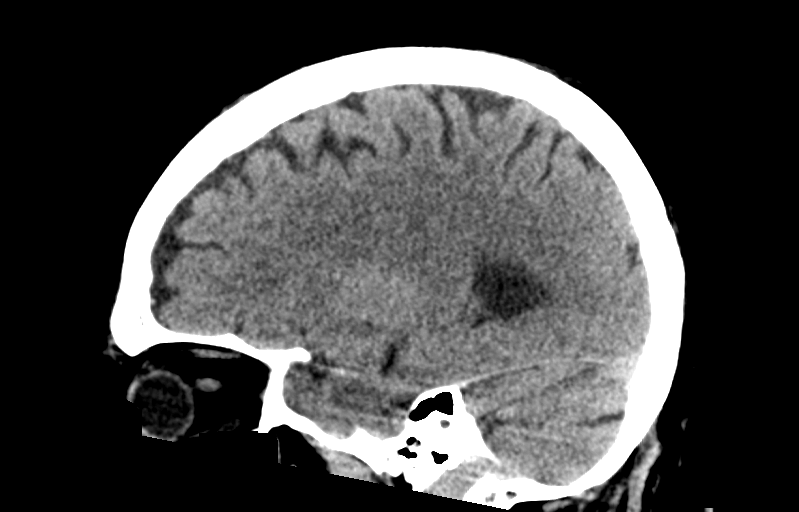
[im 26/52  brain]
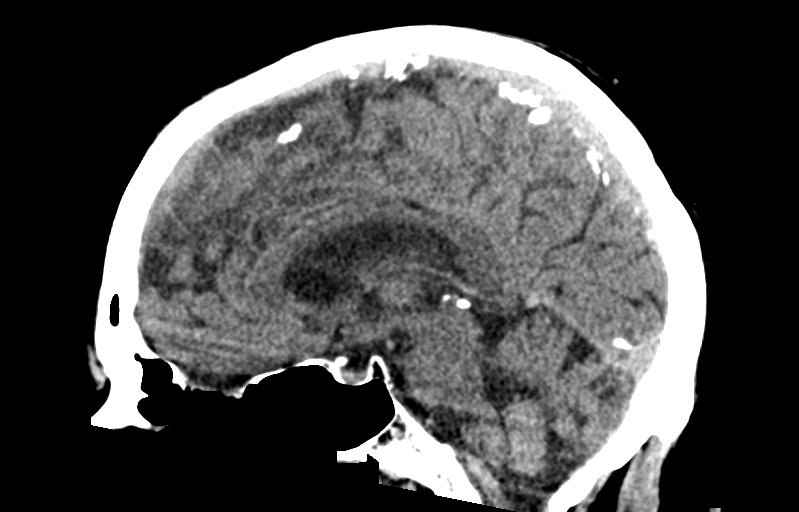
[im 35/52  brain]
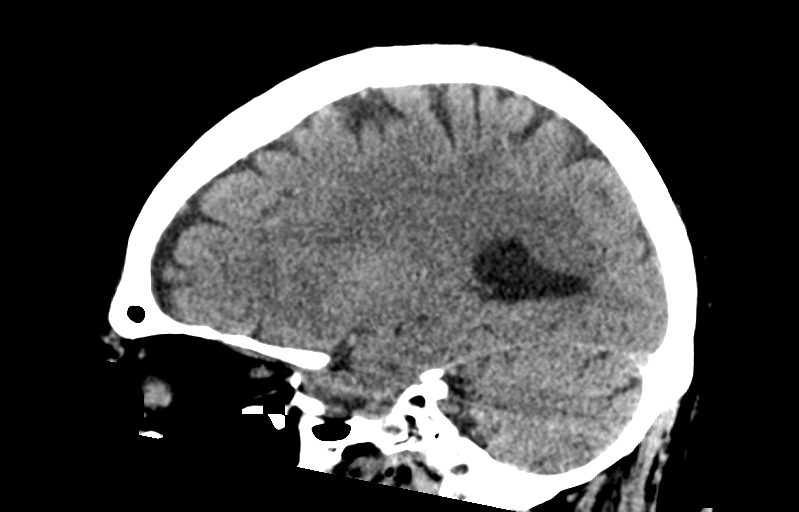

[15 of 47 positions shown; findings below may reference images not displayed]

FINDINGS: Brain: No acute territorial infarction, hemorrhage, or intracranial
mass. The ventricles are nonenlarged. Mild atrophy. Minimal
hypodensity in the white matter likely chronic small vessel ischemic
change.

Vascular: No hyperdense vessels. Vertebral and carotid vascular
calcification

Skull: No fracture. Focal sclerosis in the left frontal bone
possible bone island.

Sinuses/Orbits: No acute finding.

Other: None
IMPRESSION: 1. No CT evidence for acute intracranial abnormality.
2. Atrophy and minimal small vessel ischemic changes of the white
matter.

## 2020-10-01 IMAGING — CT CT ANGIO CHEST
2 of 6 series · 18 of 46 positions shown · IV contrast (Omnipaque or Isovue)
Comparison: Chest x-ray 12/20/2019

CLINICAL DATA: Shortness of breath with intermittent chest pain

EXAM:
CT ANGIOGRAPHY CHEST WITH CONTRAST
TECHNIQUE: Multidetector CT imaging of the chest was performed using the
standard protocol during bolus administration of intravenous
contrast. Multiplanar CT image reconstructions and MIPs were
obtained to evaluate the vascular anatomy.
CONTRAST:  100mL OMNIPAQUE IOHEXOL 350 MG/ML SOLN

[Series 6: pe axial thins · axial · 0.77mm/px · z∈[-417,-151]mm · 15 of 292 slices shown]
[im 13/292  lung]
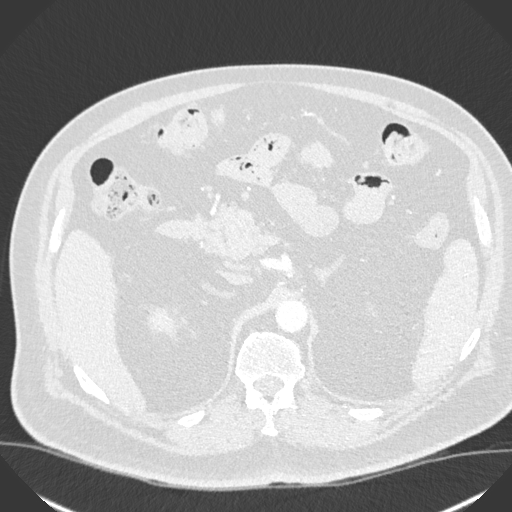
[im 38/292  soft-tissue]
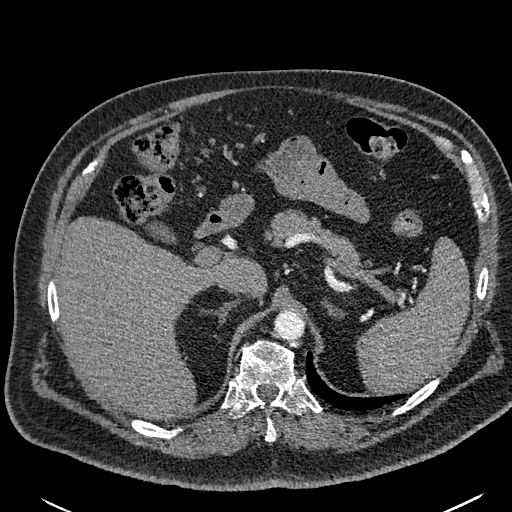
[im 51/292  lung]
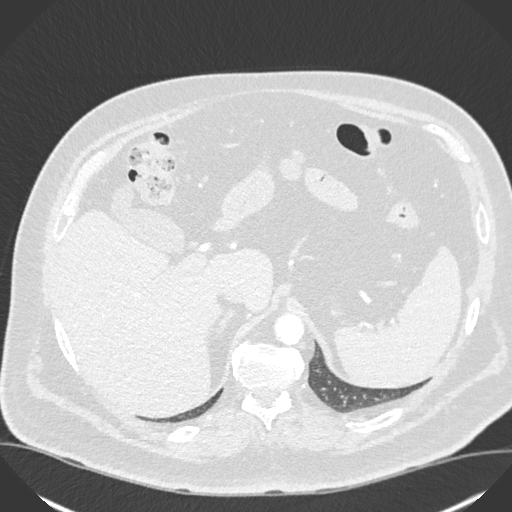
[im 76/292  soft-tissue]
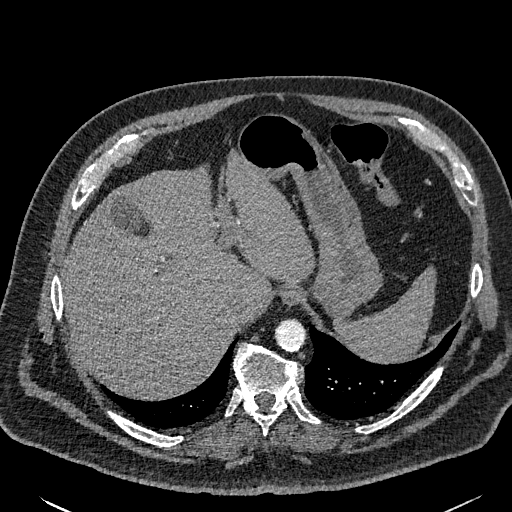
[im 89/292  lung]
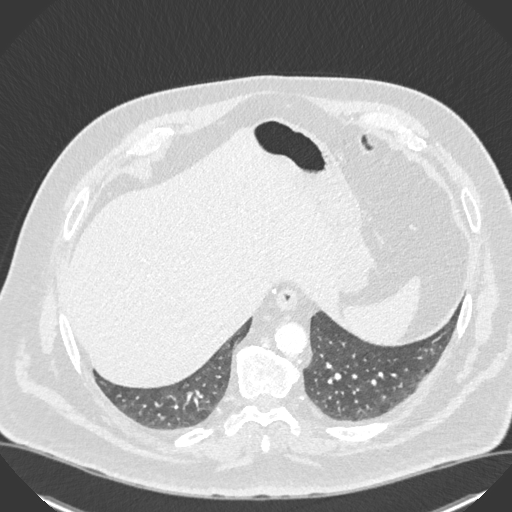
[im 114/292  soft-tissue]
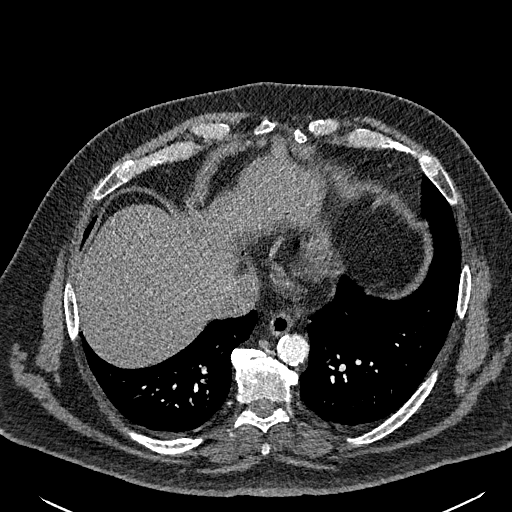
[im 127/292  lung]
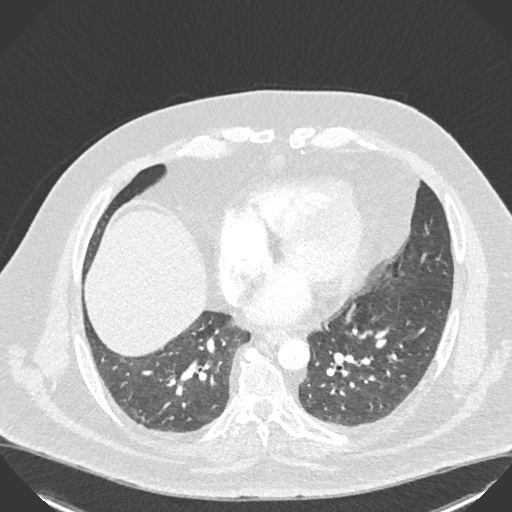
[im 152/292  soft-tissue]
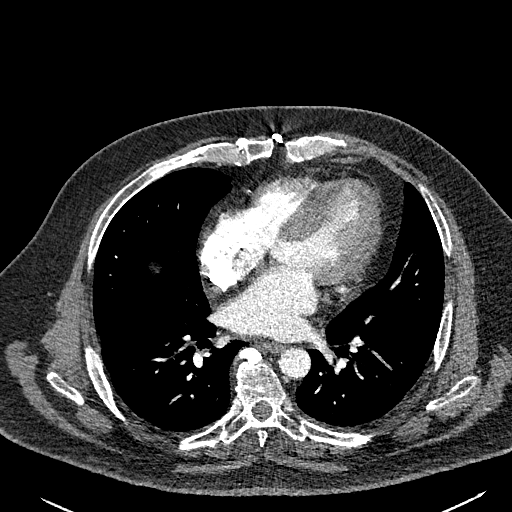
[im 165/292  lung]
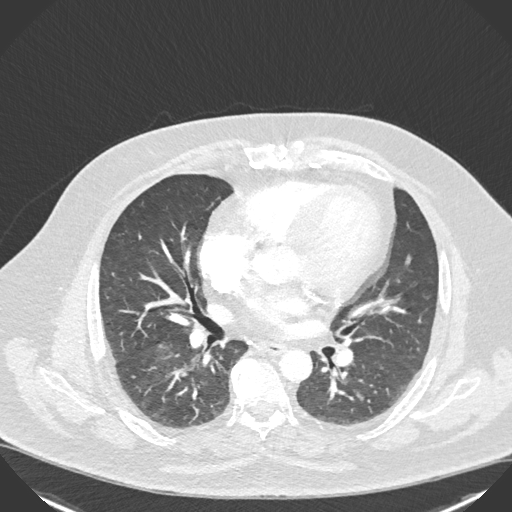
[im 178/292  soft-tissue]
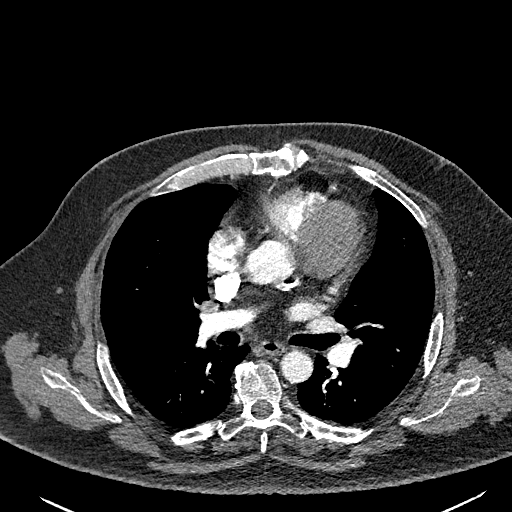
[im 203/292  lung]
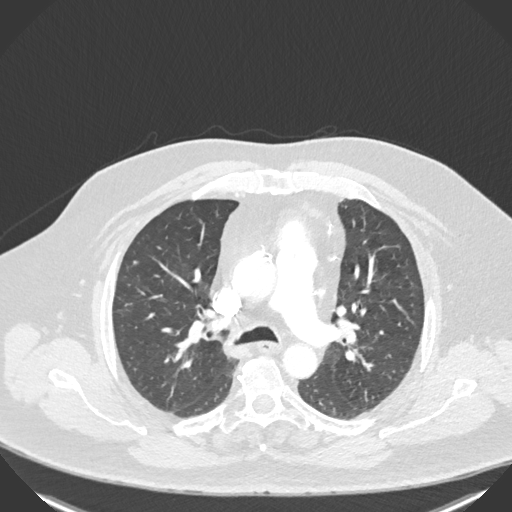
[im 216/292  soft-tissue]
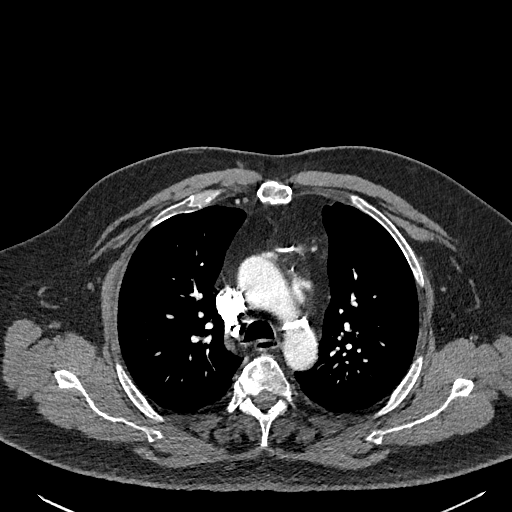
[im 241/292  lung]
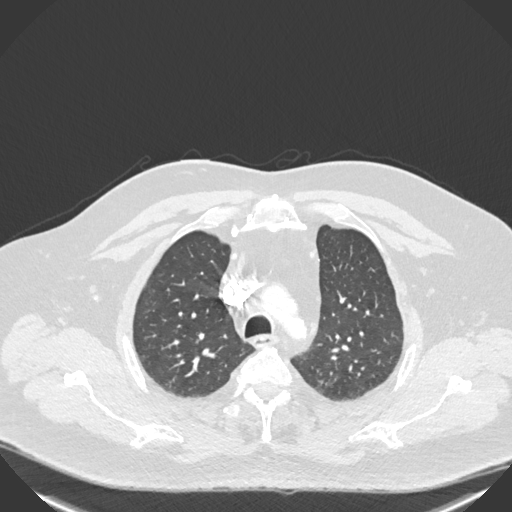
[im 254/292  soft-tissue]
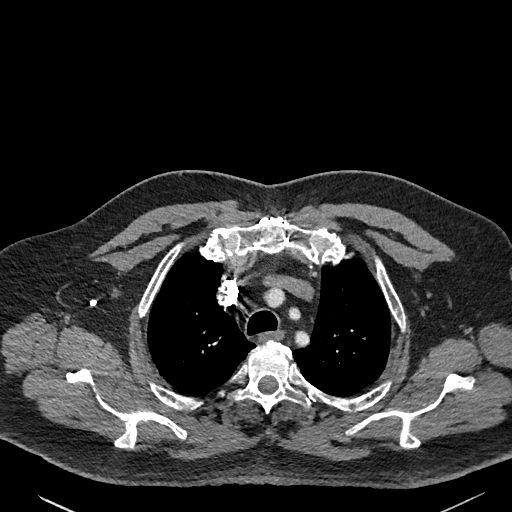
[im 279/292  lung]
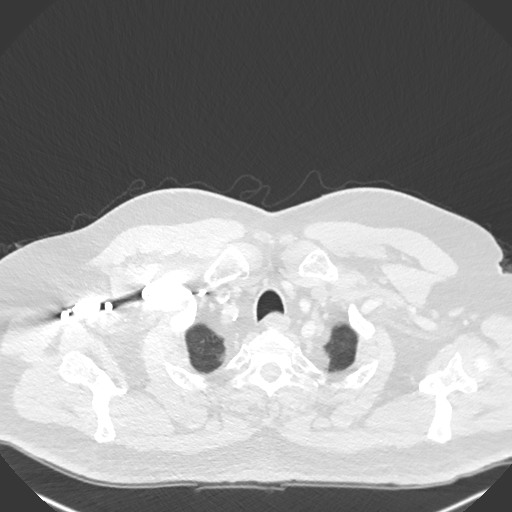

[Series 9: cor soft · coronal · 0.56mm/px · 3 of 151 slices shown]
[im 38/151  soft-tissue]
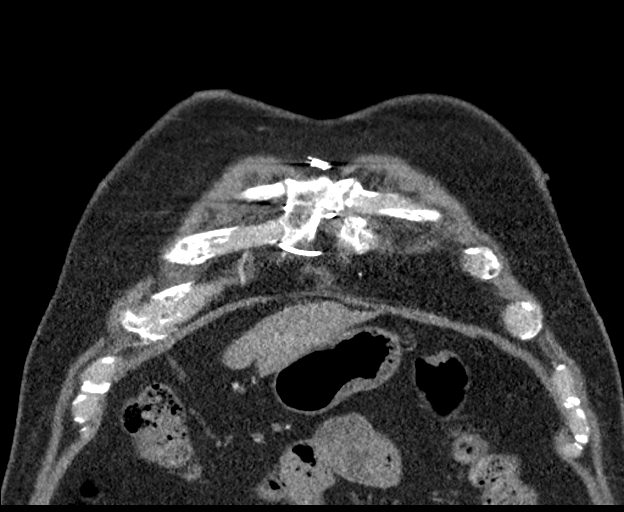
[im 76/151  soft-tissue]
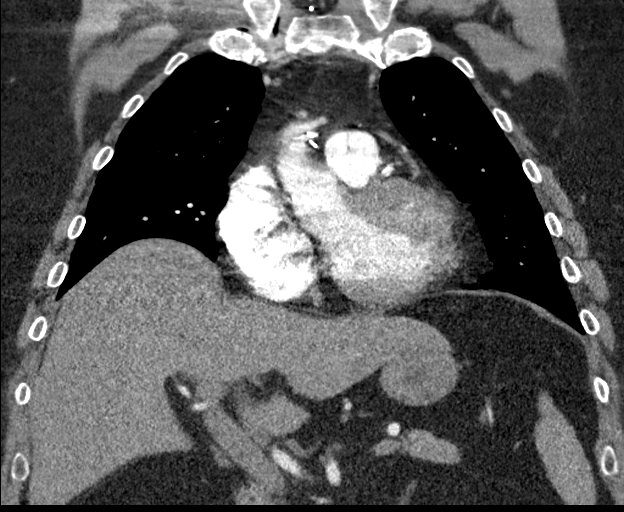
[im 113/151  soft-tissue]
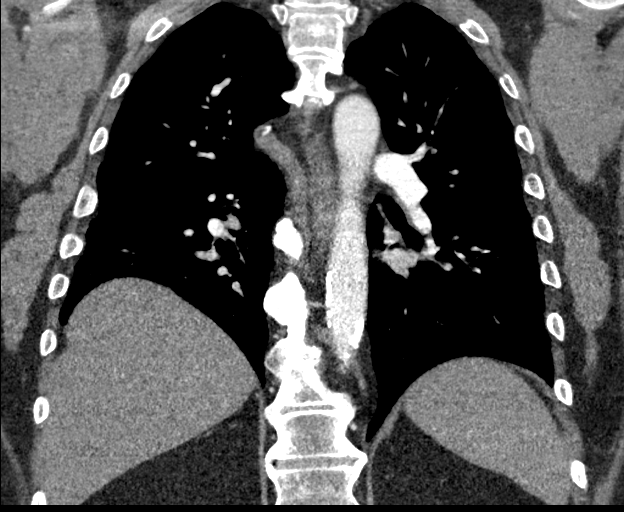

[18 of 46 positions shown; findings below may reference images not displayed]

FINDINGS: Cardiovascular: Satisfactory opacification of the pulmonary arteries
to the segmental level. No evidence of pulmonary embolism.
Nonaneurysmal aorta. Mild aortic atherosclerosis. Post CABG changes.
Coronary vascular calcification. No significant pericardial
effusion.

Mediastinum/Nodes: No enlarged mediastinal, hilar, or axillary lymph
nodes. Thyroid gland, trachea, and esophagus demonstrate no
significant findings.

Lungs/Pleura: Lungs are clear. No pleural effusion or pneumothorax.

Upper Abdomen: No acute abnormality.

Musculoskeletal: No chest wall abnormality. No acute or significant
osseous findings.

Review of the MIP images confirms the above findings.
IMPRESSION: 1. Negative for acute pulmonary embolus. Clear lung fields.
2. Aortic atherosclerosis.

Aortic Atherosclerosis ([id number]).
